# Patient Record
Sex: Male | Born: 1950
Health system: Southern US, Community
[De-identification: ages and names within clinical notes are randomized; demographics above are authoritative.]

## PROBLEM LIST (undated history)

## (undated) DIAGNOSIS — M1990 Unspecified osteoarthritis, unspecified site: Secondary | ICD-10-CM

## (undated) DIAGNOSIS — K219 Gastro-esophageal reflux disease without esophagitis: Secondary | ICD-10-CM

## (undated) DIAGNOSIS — H409 Unspecified glaucoma: Secondary | ICD-10-CM

## (undated) DIAGNOSIS — Z8719 Personal history of other diseases of the digestive system: Secondary | ICD-10-CM

## (undated) DIAGNOSIS — Z973 Presence of spectacles and contact lenses: Secondary | ICD-10-CM

## (undated) DIAGNOSIS — K222 Esophageal obstruction: Secondary | ICD-10-CM

## (undated) DIAGNOSIS — K2270 Barrett's esophagus without dysplasia: Secondary | ICD-10-CM

## (undated) DIAGNOSIS — H269 Unspecified cataract: Secondary | ICD-10-CM

## (undated) DIAGNOSIS — N486 Induration penis plastica: Secondary | ICD-10-CM

## (undated) DIAGNOSIS — N320 Bladder-neck obstruction: Secondary | ICD-10-CM

## (undated) DIAGNOSIS — K635 Polyp of colon: Secondary | ICD-10-CM

## (undated) DIAGNOSIS — Z970 Presence of artificial eye: Secondary | ICD-10-CM

## (undated) DIAGNOSIS — R7303 Prediabetes: Secondary | ICD-10-CM

## (undated) DIAGNOSIS — E119 Type 2 diabetes mellitus without complications: Secondary | ICD-10-CM

## (undated) DIAGNOSIS — E785 Hyperlipidemia, unspecified: Secondary | ICD-10-CM

## (undated) DIAGNOSIS — F17200 Nicotine dependence, unspecified, uncomplicated: Secondary | ICD-10-CM

## (undated) DIAGNOSIS — G4730 Sleep apnea, unspecified: Secondary | ICD-10-CM

## (undated) DIAGNOSIS — I10 Essential (primary) hypertension: Secondary | ICD-10-CM

## (undated) HISTORY — DX: Sleep apnea, unspecified: G47.30

## (undated) HISTORY — PX: OTHER SURGICAL HISTORY: SHX169

## (undated) HISTORY — DX: Prediabetes: R73.03

## (undated) HISTORY — DX: Gastro-esophageal reflux disease without esophagitis: K21.9

## (undated) HISTORY — DX: Type 2 diabetes mellitus without complications: E11.9

## (undated) HISTORY — PX: KNEE ARTHROSCOPY: SUR90

## (undated) HISTORY — PX: POLYPECTOMY: SHX149

## (undated) HISTORY — PX: BACK SURGERY: SHX140

## (undated) HISTORY — PX: EYE SURGERY: SHX253

## (undated) HISTORY — DX: Unspecified cataract: H26.9

## (undated) HISTORY — PX: COLONOSCOPY: SHX174

## (undated) HISTORY — DX: Polyp of colon: K63.5

## (undated) HISTORY — DX: Essential (primary) hypertension: I10

## (undated) HISTORY — PX: TONSILLECTOMY: SUR1361

## (undated) HISTORY — PX: TOTAL KNEE ARTHROPLASTY: SHX125

## (undated) HISTORY — PX: JOINT REPLACEMENT: SHX530

## (undated) HISTORY — PX: NASAL SINUS SURGERY: SHX719

## (undated) HISTORY — DX: Hyperlipidemia, unspecified: E78.5

## (undated) HISTORY — DX: Unspecified glaucoma: H40.9

## (undated) HISTORY — DX: Nicotine dependence, unspecified, uncomplicated: F17.200

## (undated) HISTORY — DX: Bladder-neck obstruction: N32.0

## (undated) HISTORY — PX: ELBOW SURGERY: SHX618

## (undated) HISTORY — DX: Presence of artificial eye: Z97.0

---

## 1998-08-27 ENCOUNTER — Other Ambulatory Visit: Admission: RE | Admit: 1998-08-27 | Discharge: 1998-08-27 | Payer: Self-pay | Admitting: Gastroenterology

## 2000-06-06 ENCOUNTER — Encounter: Payer: Self-pay | Admitting: *Deleted

## 2000-06-06 ENCOUNTER — Emergency Department (HOSPITAL_COMMUNITY): Admission: EM | Admit: 2000-06-06 | Discharge: 2000-06-06 | Payer: Self-pay | Admitting: *Deleted

## 2001-05-07 ENCOUNTER — Other Ambulatory Visit: Admission: RE | Admit: 2001-05-07 | Discharge: 2001-05-07 | Payer: Self-pay | Admitting: Urology

## 2001-05-07 ENCOUNTER — Encounter (INDEPENDENT_AMBULATORY_CARE_PROVIDER_SITE_OTHER): Payer: Self-pay | Admitting: Specialist

## 2006-07-06 ENCOUNTER — Ambulatory Visit: Payer: Self-pay | Admitting: Internal Medicine

## 2006-07-06 DIAGNOSIS — E781 Pure hyperglyceridemia: Secondary | ICD-10-CM | POA: Insufficient documentation

## 2006-07-06 DIAGNOSIS — E785 Hyperlipidemia, unspecified: Secondary | ICD-10-CM

## 2006-07-21 ENCOUNTER — Ambulatory Visit: Payer: Self-pay | Admitting: Gastroenterology

## 2006-08-14 ENCOUNTER — Ambulatory Visit: Payer: Self-pay | Admitting: Gastroenterology

## 2006-08-26 ENCOUNTER — Encounter (INDEPENDENT_AMBULATORY_CARE_PROVIDER_SITE_OTHER): Payer: Self-pay | Admitting: *Deleted

## 2006-08-26 ENCOUNTER — Ambulatory Visit: Payer: Self-pay | Admitting: Gastroenterology

## 2006-08-26 ENCOUNTER — Ambulatory Visit: Payer: Self-pay | Admitting: Internal Medicine

## 2006-08-26 LAB — CONVERTED CEMR LAB
ALT: 25 units/L (ref 0–40)
AST: 20 units/L (ref 0–37)
Albumin: 4 g/dL (ref 3.5–5.2)
Alkaline Phosphatase: 48 units/L (ref 39–117)
BUN: 9 mg/dL (ref 6–23)
Basophils Absolute: 0 10*3/uL (ref 0.0–0.1)
Basophils Relative: 0.3 % (ref 0.0–1.0)
CO2: 27 meq/L (ref 19–32)
Calcium: 9.3 mg/dL (ref 8.4–10.5)
Chloride: 104 meq/L (ref 96–112)
Chol/HDL Ratio, serum: 4.5
Cholesterol: 246 mg/dL (ref 0–200)
Creatinine, Ser: 0.9 mg/dL (ref 0.4–1.5)
Eosinophil percent: 3.1 % (ref 0.0–5.0)
GFR calc non Af Amer: 93 mL/min
Glomerular Filtration Rate, Af Am: 113 mL/min/{1.73_m2}
Glucose, Bld: 93 mg/dL (ref 70–99)
HCT: 42.4 % (ref 39.0–52.0)
HDL: 55.1 mg/dL (ref >39.0)
Hemoglobin: 14.2 g/dL (ref 13.0–17.0)
LDL DIRECT: 190.6 mg/dL
Lymphocytes Relative: 50.1 % — ABNORMAL HIGH (ref 12.0–46.0)
MCHC: 33.6 g/dL (ref 30.0–36.0)
MCV: 90.8 fL (ref 78.0–100.0)
Monocytes Absolute: 0.4 10*3/uL (ref 0.2–0.7)
Monocytes Relative: 8.7 % (ref 3.0–11.0)
Neutro Abs: 1.6 10*3/uL (ref 1.4–7.7)
Neutrophils Relative %: 37.8 % — ABNORMAL LOW (ref 43.0–77.0)
PSA: 1.39 ng/mL (ref 0.10–4.00)
Platelets: 288 10*3/uL (ref 150–400)
Potassium: 4 meq/L (ref 3.5–5.1)
RBC: 4.67 M/uL (ref 4.22–5.81)
RDW: 12 % (ref 11.5–14.6)
Sodium: 138 meq/L (ref 135–145)
TSH: 2.1 microintl units/mL (ref 0.35–5.50)
Total Bilirubin: 1.2 mg/dL (ref 0.3–1.2)
Total Protein: 7.5 g/dL (ref 6.0–8.3)
Triglyceride fasting, serum: 64 mg/dL (ref 0–149)
VLDL: 13 mg/dL (ref 0–40)
WBC: 4.2 10*3/uL — ABNORMAL LOW (ref 4.5–10.5)

## 2006-09-02 ENCOUNTER — Ambulatory Visit: Payer: Self-pay | Admitting: Internal Medicine

## 2006-09-29 ENCOUNTER — Ambulatory Visit: Payer: Self-pay | Admitting: Gastroenterology

## 2006-10-13 ENCOUNTER — Ambulatory Visit: Payer: Self-pay | Admitting: Gastroenterology

## 2006-10-13 ENCOUNTER — Ambulatory Visit: Payer: Self-pay | Admitting: Internal Medicine

## 2006-10-13 LAB — CONVERTED CEMR LAB
ALT: 38 units/L (ref 0–40)
AST: 28 units/L (ref 0–37)
Albumin: 4.1 g/dL (ref 3.5–5.2)
Alkaline Phosphatase: 48 units/L (ref 39–117)
Bilirubin, Direct: 0.1 mg/dL (ref 0.0–0.3)
Cholesterol: 266 mg/dL (ref 0–200)
Direct LDL: 160.1 mg/dL
HDL: 58.8 mg/dL (ref >39.0)
Total Bilirubin: 1.1 mg/dL (ref 0.3–1.2)
Total CHOL/HDL Ratio: 4.5
Total Protein: 7.5 g/dL (ref 6.0–8.3)
Triglycerides: 188 mg/dL — ABNORMAL HIGH (ref 0–149)
VLDL: 38 mg/dL (ref 0–40)

## 2006-10-21 ENCOUNTER — Ambulatory Visit: Payer: Self-pay | Admitting: Internal Medicine

## 2006-12-02 ENCOUNTER — Ambulatory Visit: Payer: Self-pay | Admitting: Internal Medicine

## 2006-12-02 LAB — CONVERTED CEMR LAB
ALT: 41 units/L — ABNORMAL HIGH (ref 0–40)
AST: 24 units/L (ref 0–37)
Albumin: 3.4 g/dL — ABNORMAL LOW (ref 3.5–5.2)
Alkaline Phosphatase: 49 units/L (ref 39–117)
Bilirubin, Direct: 0.2 mg/dL (ref 0.0–0.3)
Cholesterol: 203 mg/dL (ref 0–200)
Direct LDL: 108.9 mg/dL
HDL: 51.2 mg/dL (ref >39.0)
Total Bilirubin: 1 mg/dL (ref 0.3–1.2)
Total CHOL/HDL Ratio: 4
Total Protein: 7.4 g/dL (ref 6.0–8.3)
Triglycerides: 244 mg/dL (ref 0–149)
VLDL: 49 mg/dL — ABNORMAL HIGH (ref 0–40)

## 2006-12-09 ENCOUNTER — Ambulatory Visit: Payer: Self-pay | Admitting: Internal Medicine

## 2008-01-11 ENCOUNTER — Ambulatory Visit: Payer: Self-pay | Admitting: Internal Medicine

## 2008-01-11 DIAGNOSIS — I10 Essential (primary) hypertension: Secondary | ICD-10-CM | POA: Insufficient documentation

## 2008-03-01 ENCOUNTER — Encounter: Payer: Self-pay | Admitting: Internal Medicine

## 2008-03-13 ENCOUNTER — Ambulatory Visit: Payer: Self-pay | Admitting: Internal Medicine

## 2008-04-10 ENCOUNTER — Telehealth: Payer: Self-pay | Admitting: Internal Medicine

## 2008-04-17 ENCOUNTER — Inpatient Hospital Stay (HOSPITAL_COMMUNITY): Admission: RE | Admit: 2008-04-17 | Discharge: 2008-04-19 | Payer: Self-pay | Admitting: Orthopedic Surgery

## 2008-12-29 ENCOUNTER — Ambulatory Visit: Payer: Self-pay | Admitting: Internal Medicine

## 2009-01-04 ENCOUNTER — Encounter: Payer: Self-pay | Admitting: Internal Medicine

## 2009-01-09 LAB — CONVERTED CEMR LAB
Albumin: 3.9 g/dL (ref 3.5–5.2)
Alkaline Phosphatase: 40 units/L (ref 39–117)
BUN: 15 mg/dL (ref 6–23)
CO2: 29 meq/L (ref 19–32)
Chloride: 105 meq/L (ref 96–112)
Cholesterol: 277 mg/dL — ABNORMAL HIGH (ref 0–200)
Direct LDL: 122.9 mg/dL
Potassium: 4.1 meq/L (ref 3.5–5.1)
Total Bilirubin: 1.2 mg/dL (ref 0.3–1.2)
Total CHOL/HDL Ratio: 7
Triglycerides: 717 mg/dL — ABNORMAL HIGH (ref 0.0–149.0)
VLDL: 143.4 mg/dL — ABNORMAL HIGH (ref 0.0–40.0)

## 2009-09-22 HISTORY — PX: OTHER SURGICAL HISTORY: SHX169

## 2010-01-17 ENCOUNTER — Ambulatory Visit: Payer: Self-pay | Admitting: Internal Medicine

## 2010-01-17 DIAGNOSIS — G4730 Sleep apnea, unspecified: Secondary | ICD-10-CM | POA: Insufficient documentation

## 2010-01-17 DIAGNOSIS — K219 Gastro-esophageal reflux disease without esophagitis: Secondary | ICD-10-CM

## 2010-01-22 LAB — CONVERTED CEMR LAB
BUN: 15 mg/dL (ref 6–23)
Calcium: 9.1 mg/dL (ref 8.4–10.5)
GFR calc non Af Amer: 127.66 mL/min (ref >60)
Glucose, Bld: 101 mg/dL — ABNORMAL HIGH (ref 70–99)
HDL: 48.3 mg/dL (ref >39.00)
Triglycerides: 787 mg/dL — ABNORMAL HIGH (ref 0.0–149.0)

## 2010-01-31 ENCOUNTER — Telehealth: Payer: Self-pay | Admitting: Internal Medicine

## 2010-02-19 ENCOUNTER — Ambulatory Visit (HOSPITAL_BASED_OUTPATIENT_CLINIC_OR_DEPARTMENT_OTHER): Admission: RE | Admit: 2010-02-19 | Discharge: 2010-02-19 | Payer: Self-pay | Admitting: Internal Medicine

## 2010-02-19 ENCOUNTER — Encounter: Payer: Self-pay | Admitting: Internal Medicine

## 2010-02-26 ENCOUNTER — Ambulatory Visit: Payer: Self-pay | Admitting: Pulmonary Disease

## 2010-03-08 ENCOUNTER — Ambulatory Visit: Payer: Self-pay | Admitting: Internal Medicine

## 2010-03-11 LAB — CONVERTED CEMR LAB
AST: 21 units/L (ref 0–37)
Alkaline Phosphatase: 44 units/L (ref 39–117)
Bilirubin, Direct: 0.1 mg/dL (ref 0.0–0.3)
Total Bilirubin: 0.8 mg/dL (ref 0.3–1.2)
Total CHOL/HDL Ratio: 5

## 2010-05-01 ENCOUNTER — Ambulatory Visit: Payer: Self-pay | Admitting: Internal Medicine

## 2010-05-01 LAB — CONVERTED CEMR LAB
Bilirubin, Direct: 0.1 mg/dL (ref 0.0–0.3)
HDL: 41.2 mg/dL (ref >39.00)
Total Bilirubin: 0.2 mg/dL — ABNORMAL LOW (ref 0.3–1.2)
Total Protein: 6.8 g/dL (ref 6.0–8.3)
VLDL: 342.2 mg/dL — ABNORMAL HIGH (ref 0.0–40.0)

## 2010-06-07 ENCOUNTER — Ambulatory Visit: Payer: Self-pay | Admitting: Internal Medicine

## 2010-06-19 ENCOUNTER — Observation Stay (HOSPITAL_COMMUNITY)
Admission: EM | Admit: 2010-06-19 | Discharge: 2010-06-20 | Payer: Self-pay | Source: Home / Self Care | Admitting: Emergency Medicine

## 2010-06-26 ENCOUNTER — Encounter: Payer: Self-pay | Admitting: Internal Medicine

## 2010-07-11 ENCOUNTER — Ambulatory Visit: Payer: Self-pay | Admitting: Internal Medicine

## 2010-07-11 LAB — CONVERTED CEMR LAB
AST: 16 units/L (ref 0–37)
Albumin: 4.5 g/dL (ref 3.5–5.2)
Cholesterol: 219 mg/dL — ABNORMAL HIGH (ref 0–200)
Direct LDL: 92.4 mg/dL
Total CHOL/HDL Ratio: 4
Triglycerides: 528 mg/dL — ABNORMAL HIGH (ref 0.0–149.0)
VLDL: 105.6 mg/dL — ABNORMAL HIGH (ref 0.0–40.0)

## 2010-07-18 ENCOUNTER — Encounter: Payer: Self-pay | Admitting: Internal Medicine

## 2010-07-18 ENCOUNTER — Ambulatory Visit: Payer: Self-pay | Admitting: Internal Medicine

## 2010-08-29 ENCOUNTER — Encounter
Admission: RE | Admit: 2010-08-29 | Discharge: 2010-08-29 | Payer: Self-pay | Source: Home / Self Care | Attending: Otolaryngology | Admitting: Otolaryngology

## 2010-10-17 ENCOUNTER — Telehealth: Payer: Self-pay | Admitting: Internal Medicine

## 2010-10-22 NOTE — Letter (Signed)
Summary: [facility]  [facility]   Imported By: Maryln Gottron 07/08/2010 09:28:38  _____________________________________________________________________  External Attachment:    Type:   Image     Comment:   External Document

## 2010-10-22 NOTE — Assessment & Plan Note (Signed)
Summary: ROA/BP CK/RCD   Vital Signs:  Patient profile:   60 year old male Weight:      226 pounds Pulse rate:   76 / minute Pulse rhythm:   regular Resp:     12 per minute BP sitting:   144 / 102  (left arm) Cuff size:   regular  Vitals Entered By: Gladis Riffle, RN (January 17, 2010 9:10 AM) CC: BP check--med review and needs refills Is Patient Diabetic? No   CC:  BP check--med review and needs refills.  History of Present Illness:  Follow-Up Visit      This is a 60 year old man who presents for Follow-up visit.  The patient denies chest pain and palpitations.  Since the last visit the patient notes no new problems or concerns except admits to hypersomnolence.  The patient reports taking meds as prescribed.  When questioned about possible medication side effects, the patient notes none.    All other systems reviewed and were negative   Preventive Screening-Counseling & Management  Alcohol-Tobacco     Smoking Status: quit     Year Quit: 2000  Current Problems (verified): 1)  Hypertension  (ICD-401.9) 2)  Hyperlipidemia  (ICD-272.4)  Current Medications (verified): 1)  Prevacid 30 Mg Cpdr (Lansoprazole) .... Take 1 Capsule By Mouth Once A Day As Needed 2)  Bl Aspirin 325 Mg  Tabs (Aspirin) .... As Needed Pain 3)  Lisinopril 20 Mg Tabs (Lisinopril) .Marland Kitchen.. 1 Tablet By Mouth Daily 4)  Simvastatin 40 Mg Tabs (Simvastatin) .... Take 1 Tablet By Mouth Once A Day  Allergies (verified): No Known Drug Allergies  Past History:  Past Medical History: Hyperlipidemia Bladder Neck Obstruction Hypertension GERD  Family History: Reviewed history from 07/06/2006 and no changes required. Family History of Colon CA 1st degree relative <60 Family History Lung cancer  Social History: Reviewed history from 05/31/2007 and no changes required. 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   Eyes:  pupils equal and pupils round.   Ears:  R ear normal and L ear normal.   Neck:  No deformities, masses, or tenderness noted. Chest Wall:  No deformities, masses, tenderness or gynecomastia noted. Lungs:  Normal respiratory effort, chest expands symmetrically. Lungs are clear to auscultation, no crackles or wheezes. Heart:  Normal rate and regular rhythm. S1 and S2 normal without gallop, murmur, click, rub or other extra sounds. Abdomen:   normal bowel sounds; no hepatosplenomegaly no ventral,umbilical hernias or masses noted.   Skin:  turgor normal and color normal.   Psych:  normally interactive and good eye contact.     Impression & Recommendations:  Problem # 1:  HYPERTENSION (ICD-401.9) Assessment Deteriorated  not as well controlled increalse meds side effects discussed His updated medication list for this problem includes:    Lisinopril-hydrochlorothiazide 20-25 Mg Tabs (Lisinopril-hydrochlorothiazide) .Marland Kitchen... Take 1 tab by mouth daily  BP today: 144/102 Prior BP: 136/98 (12/29/2008)  Labs Reviewed: K+: 4.1 (12/29/2008) Creat: : 0.9 (12/29/2008)   Chol: 277 (12/29/2008)   HDL: 40.60 (12/29/2008)   LDL: DEL (12/02/2006)   TG: 717.0 (12/29/2008)  Orders: Venipuncture ([address]) TLB-Lipid Panel (80061-LIPID) TLB-BMP (Basic Metabolic Panel-BMET) (80048-METABOL) TLB-TSH (Thyroid Stimulating Hormone) (84443-TSH)  Problem # 2:  GERD (ICD-530.81) Assessment: Unchanged  His updated medication list for this problem includes:    Prevacid 30 Mg Cpdr (Lansoprazole) .Marland KitchenMarland KitchenMarland KitchenMarland Kitchen  Take 1 capsule by mouth once a day as needed  Labs Reviewed: Hgb: 14.2 (08/26/2006)   Hct: 42.4 (08/26/2006)  Problem # 3:  HYPERLIPIDEMIA (ICD-272.4)  he admits to forgetting the meds His updated medication list for this problem includes:    Simvastatin 40 Mg  Tabs (Simvastatin) .Marland Kitchen... Take 1 tablet by mouth once a day  Labs Reviewed: SGOT: 19 (12/29/2008)   SGPT: 30 (12/29/2008)   HDL:40.60 (12/29/2008), 51.2 (12/02/2006)  LDL:DEL (12/02/2006), DEL (10/13/2006)  Chol:277 (12/29/2008), 203 (12/02/2006)  Trig:717.0 (12/29/2008), 244 (12/02/2006)  Orders: Venipuncture (16[address]) TLB-Lipid Panel (80061-LIPID) TLB-BMP (Basic Metabolic Panel-BMET) (80048-METABOL) TLB-TSH (Thyroid Stimulating Hormone) (84443-TSH)  Problem # 4:  SLEEP APNEA (ICD-780.57) Assessment: New need to evaluate  Orders: Sleep Disorder Referral (Sleep Disorder)  Complete Medication List: 1)  Prevacid 30 Mg Cpdr (Lansoprazole) .... Take 1 capsule by mouth once a day as needed 2)  Bl Aspirin 325 Mg Tabs (Aspirin) .... As needed pain 3)  Lisinopril-hydrochlorothiazide 20-25 Mg Tabs (Lisinopril-hydrochlorothiazide) .... Take 1 tab by mouth daily 4)  Simvastatin 40 Mg Tabs (Simvastatin) .... Take 1 tablet by mouth once a day  Patient Instructions: 1)  Please schedule a follow-up appointment in 1 month. Prescriptions: LISINOPRIL-HYDROCHLOROTHIAZIDE 20-25 MG  TABS (LISINOPRIL-HYDROCHLOROTHIAZIDE) Take 1 tab by mouth daily  #90 x 3   Entered and Authorized by:   Birdie Sons MD   Signed by:   Birdie Sons MD on 01/17/2010   Method used:   [facility]        [facility]  [address] [phone]* (retail)       [address]       [place], [place]  [address]       Ph: [phone]       Fax: [phone]   RxID:   [id number]

## 2010-10-22 NOTE — Progress Notes (Signed)
Summary: INFO ONLY - CONCERNING APPTS  Phone Note Call from Patient   Summary of Call: INFO ONLY------- Attempted to make contact with pt at home # listed [phone] and received a msg adv that the # had been changed to [phone].... However, when that # was called, a lady answered the phone and adv that there was no one by the name of the pt there and I had the wrong #. I contacted the pts daughter who provided the cell # for the pt [phone].... I called the pt at that # and spoke with him, he adv that he was at work and he would have to c/b once he figured out his vacation time / days available... I adv pt to be sure that he called back so that an appt for labs (lipid 272.4 / liver 995.2) and f/u appt with Dr Cato Mulligan can be scheduled.... Pt acknowledged same and adv he woud c/b for appt..   Initial call taken by: Debbra Riding,  Jan 31, 2010 12:17 PM

## 2010-10-22 NOTE — Procedures (Signed)
Summary: Colonoscopy/[facility]  [facility]   Imported By: Lanelle Bal 03/08/2010 09:29:25  _____________________________________________________________________  External Attachment:    Type:   Image     Comment:   External Document

## 2010-10-22 NOTE — Assessment & Plan Note (Signed)
Vital Signs:  Patient profile:   60 year old male Weight:      210 pounds Temp:     98.6 degrees F oral Pulse rhythm:   regular Resp:     16 per minute BP sitting:   110 / 82  Vitals Entered By: Lynann Beaver CMA AAMA (July 18, 2010 8:15 AM) CC: rov Is Patient Diabetic? No Pain Assessment Patient in pain? no        CC:  rov.  History of Present Illness: recent traumatic injury---required enucleation right eye (work accident)  htn---tolerating meds lipids: tolerating meds GERD---tolerating meds without difficulty  All other systems reviewed and were negative -- has f/u with ophthalmologist  Current Problems (verified): 1)  Preventive Health Care  (ICD-V70.0) 2)  Sleep Apnea  (ICD-780.57) 3)  Gerd  (ICD-530.81) 4)  Hypertension  (ICD-401.9) 5)  Hyperlipidemia  (ICD-272.4)  Current Medications (verified): 1)  Prevacid 30 Mg Cpdr (Lansoprazole) .... Take 1 Capsule By Mouth Once A Day As Needed 2)  Bl Aspirin 325 Mg  Tabs (Aspirin) .... As Needed Pain 3)  Lisinopril-Hydrochlorothiazide 20-25 Mg  Tabs (Lisinopril-Hydrochlorothiazide) .... Take 1 Tab By Mouth Daily 4)  Crestor 20 Mg Tabs (Rosuvastatin Calcium) .Marland Kitchen.. 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   Head:  normocephalic and atraumatic.   Eyes:  absent right eye Neck:  No deformities, masses, or tenderness noted. Lungs:  normal respiratory effort and no intercostal retractions.   Heart:  normal rate and regular rhythm.   Abdomen:   normal bowel sounds; no hepatosplenomegaly no ventral,umbilical hernias or masses noted.   Skin:  turgor normal and color normal.   Psych:  normally interactive and good eye contact.     Impression & Recommendations:  Problem # 1:  HYPERTENSION (ICD-401.9) tolerating meds His updated medication list for this problem includes:    Lisinopril-hydrochlorothiazide 20-25 Mg Tabs (Lisinopril-hydrochlorothiazide) .Marland Kitchen... Take 1 tab by mouth daily  BP today: 110/82 Prior BP: 120/84 (06/07/2010)  Labs Reviewed: K+: 4.2 (01/17/2010) Creat: : 0.8 (01/17/2010)   Chol: 219 (07/11/2010)   HDL: 49.00 (07/11/2010)   LDL: DEL (12/02/2006)   TG: 528.0 (07/11/2010)  Problem # 2:  HYPERLIPIDEMIA (ICD-272.4) better continue current medications and f/u in 4 months His updated medication list for this problem includes:    Crestor 20 Mg Tabs (Rosuvastatin calcium) .Marland Kitchen... 1 tablet by mouth daily  Labs Reviewed: SGOT: 16 (07/11/2010)   SGPT: 31 (07/11/2010)   HDL:49.00 (07/11/2010), 41.20 (05/01/2010)  LDL:DEL (12/02/2006), DEL (10/13/2006)  Chol:219 (07/11/2010), 252 (05/01/2010)  Trig:528.0 (07/11/2010), 1711.0 Triglyceride is over 400; calculations on Lipids are invalid. mg/dL (16/06/9603)  Problem # 3:  GERD (ICD-530.81) controlled continue current medications  His updated medication list for this problem includes:    Prevacid 30 Mg Cpdr (Lansoprazole) .Marland Kitchen... Take 1 capsule by mouth once a day as needed  Labs Reviewed: Hgb: 14.2 (08/26/2006)  Hct: 42.4 (08/26/2006)  Complete Medication List: 1)  Prevacid 30 Mg Cpdr (Lansoprazole) .... Take 1 capsule by mouth once a day as needed 2)  Bl Aspirin 325 Mg Tabs (Aspirin) .... As needed pain 3)   Lisinopril-hydrochlorothiazide 20-25 Mg Tabs (Lisinopril-hydrochlorothiazide) .... Take 1 tab by mouth daily 4)  Crestor 20 Mg Tabs (Rosuvastatin calcium) .Marland Kitchen.. 1 tablet by mouth daily  Patient Instructions: 1)  Please schedule a follow-up appointment in 4 months. 2)  lipids 272.4 3)  liver 995.2 4)  bmet--995.2   Orders Added: 1)  Est. Patient Level IV [54627]

## 2010-10-22 NOTE — Assessment & Plan Note (Signed)
Summary: cpx/pt will come in fasting/njr   Vital Signs:  Patient profile:   60 year old male Height:      70 inches Weight:      218 pounds BMI:     31.39 Temp:     98.5 degrees F oral BP sitting:   120 / 84  (left arm) Cuff size:   regular  Vitals Entered By: Kern Reap CMA Duncan Dull) (June 07, 2010 8:14 AM)  Contraindications/Deferment of Procedures/Staging:    Test/Procedure: FLU VAX    Reason for deferment: patient declined  CC: cpx Is Patient Diabetic? No Pain Assessment Patient in pain? yes        CC:  cpx.  History of Present Illness: CPX  new complaint---left arm pain, typically notices when he is driving-  Preventive Screening-Counseling & Management  Alcohol-Tobacco     Smoking Status: current     Packs/Day: <0.25  Current Problems (verified): 1)  Sleep Apnea  (ICD-780.57) 2)  Gerd  (ICD-530.81) 3)  Hypertension  (ICD-401.9) 4)  Hyperlipidemia  (ICD-272.4)  Current Medications (verified): 1)  Prevacid 30 Mg Cpdr (Lansoprazole) .... Take 1 Capsule By Mouth Once A Day As Needed 2)  Bl Aspirin 325 Mg  Tabs (Aspirin) .... As Needed Pain 3)  Lisinopril-Hydrochlorothiazide 20-25 Mg  Tabs (Lisinopril-Hydrochlorothiazide) .... Take 1 Tab By Mouth Daily 4)  Simvastatin 40 Mg Tabs (Simvastatin) .... 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 Take 1 capsule by mouth once a day as needed  Problem # 4:  HYPERTENSION (ICD-401.9)  His updated medication list for this problem includes:    Lisinopril-hydrochlorothiazide 20-25 Mg Tabs (Lisinopril-hydrochlorothiazide) .Marland Kitchen... Take 1 tab by mouth daily  Problem # 5:  HYPERLIPIDEMIA (ICD-272.4)  His updated medication list for this problem includes:    Crestor 20 Mg Tabs (Rosuvastatin calcium) .Marland Kitchen... 1 tablet by mouth daily  Labs Reviewed: SGOT: 28 (05/01/2010)   SGPT: 34 (05/01/2010)   HDL:41.20 (05/01/2010), 55.00 (03/08/2010)  LDL:DEL (12/02/2006), DEL (10/13/2006)  Chol:252 (05/01/2010), 266 (03/08/2010)  Trig:1711.0 Triglyceride is over 400; calculations on Lipids are invalid. mg/dL (16/06/9603), 540.9 (81/19/1478)  Complete Medication List: 1)  Prevacid 30 Mg Cpdr (Lansoprazole) .... Take 1 capsule by mouth once a day as needed 2)  Bl Aspirin 325 Mg Tabs (Aspirin) .... As needed pain 3)  Lisinopril-hydrochlorothiazide 20-25 Mg Tabs (Lisinopril-hydrochlorothiazide) .... Take 1 tab by mouth daily 4)  Crestor 20 Mg Tabs (Rosuvastatin calcium) .Marland Kitchen.. 1 tablet by mouth daily Prescriptions: CRESTOR 20 MG TABS (ROSUVASTATIN CALCIUM) 1 tablet by mouth daily  #90 x 3   Entered and Authorized by:   Birdie Sons  MD   Signed by:   Birdie Sons MD on 06/07/2010   Method used:   Samples Given   RxID:   [id number]  Physical Exam General Appearance: well developed, well nourished, no acute distress Eyes: conjunctiva and lids normal, PERRL, EOMI,  Ears, Nose, Mouth, Throat: TM clear, nares  clear, oral exam WNL Neck: supple, no lymphadenopathy, no thyromegaly, no JVD Respiratory: clear to auscultation and percussion, respiratory effort normal Cardiovascular: regular rate and rhythm, S1-S2, no murmur, rub or gallop, no bruits, peripheral pulses normal and symmetric, no cyanosis, clubbing, edema or varicosities Chest: no scars, masses, tenderness; no asymmetry, skin changes, nipple discharge, no gynecomastia   Gastrointestinal: soft, non-tender; no hepatosplenomegaly, masses; active bowel sounds all quadrants,  no masses, tenderness, hemorrhoids  Genitourinary: no hernia, or prostate enlargement Lymphatic: no cervical, axillary or inguinal adenopathy Musculoskeletal: gait normal, muscle tone and strength WNL, no joint swelling, effusions, discoloration, crepitus  Skin: clear, good turgor, color WNL, no rashes, lesions, or ulcerations Neurologic: normal mental status, normal reflexes, normal strength, sensation, and motion Psychiatric: alert; oriented to person, place and time Other Exam:      Immunization History:  Tetanus/Td Immunization History:    Tetanus/Td:  historical (09/23/2007)

## 2010-10-22 NOTE — Assessment & Plan Note (Signed)
Summary: follow up/pt coming in fasting/cjr   Vital Signs:  Patient profile:   60 year old male Height:      72 inches Weight:      221 pounds BMI:     30.08 Temp:     98.4 degrees F oral Pulse rate:   80 / minute Pulse rhythm:   regular Resp:     12 per minute BP sitting:   110 / 86  (left arm) Cuff size:   regular  Vitals Entered By: Gladis Riffle, RN (March 08, 2010 8:15 AM) CC: 1 month rov, fasting Is Patient Diabetic? No   CC:  1 month rov and fasting.  History of Present Illness:  Follow-Up Visit      This is a 60 year old man who presents for Follow-up visit.  The patient denies chest pain and palpitations.  Since the last visit the patient notes no new problems or concerns.  The patient reports taking meds as prescribed.  When questioned about possible medication side effects, the patient notes none.    All other systems reviewed and were negative   Preventive Screening-Counseling & Management  Alcohol-Tobacco     Smoking Status: quit     Year Quit: 2000  Current Problems (verified): 1)  Sleep Apnea  (ICD-780.57) 2)  Gerd  (ICD-530.81) 3)  Hypertension  (ICD-401.9) 4)  Hyperlipidemia  (ICD-272.4)  Current Medications (verified): 1)  Prevacid 30 Mg Cpdr (Lansoprazole) .... Take 1 Capsule By Mouth Once A Day As Needed 2)  Bl Aspirin 325 Mg  Tabs (Aspirin) .... As Needed Pain 3)  Lisinopril-Hydrochlorothiazide 20-25 Mg  Tabs (Lisinopril-Hydrochlorothiazide) .... Take 1 Tab By Mouth Daily 4)  Simvastatin 40 Mg Tabs (Simvastatin) .... 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   Eyes:  pupils equal and pupils round.   Ears:  R ear normal and L ear normal.   Neck:  No deformities, masses, or tenderness noted. Chest Wall:  No deformities, masses, tenderness or gynecomastia noted. Lungs:  Normal respiratory effort, chest expands symmetrically. Lungs are clear to auscultation, no crackles or wheezes. Heart:  Normal rate and regular rhythm. S1 and S2 normal without gallop, murmur, click, rub or other extra sounds. Abdomen:   normal bowel sounds; no hepatosplenomegaly no ventral,umbilical hernias or masses noted.   Msk:  No deformity or scoliosis noted of thoracic or lumbar spine.   Neurologic:  cranial nerves II-XII intact and gait normal.   Psych:  normally interactive and good eye contact.     Impression & Recommendations:  Problem # 1:  SLEEP APNEA (ICD-780.57) Assessment New  discussed at length needs RX see orders  Orders: Home Health Referral [facility])  Problem # 2:  HYPERTENSION (ICD-401.9) controlled continue current medications  His updated medication list for this problem includes:    Lisinopril-hydrochlorothiazide 20-25 Mg Tabs (  Lisinopril-hydrochlorothiazide) .Marland Kitchen... Take 1 tab by mouth daily  BP today: 110/86 Prior BP: 144/102 (01/17/2010)  Labs Reviewed: K+: 4.2 (01/17/2010) Creat: : 0.8 (01/17/2010)   Chol: 279 (01/17/2010)   HDL: 48.30 (01/17/2010)   LDL: DEL (12/02/2006)   TG: 787.0 (01/17/2010)  Problem # 3:  HYPERLIPIDEMIA (ICD-272.4)  check labs today has been out of simvastatin for 3-4  days His updated medication list for this problem includes:    Simvastatin 40 Mg Tabs (Simvastatin) .Marland Kitchen... Take 1 tablet by mouth once a day  Labs Reviewed: SGOT: 19 (12/29/2008)   SGPT: 30 (12/29/2008)   HDL:48.30 (01/17/2010), 40.60 (12/29/2008)  LDL:DEL (12/02/2006), DEL (10/13/2006)  Chol:279 (01/17/2010), 277 (12/29/2008)  Trig:787.0 (01/17/2010), 717.0 (12/29/2008)  Orders: Venipuncture (33295) TLB-Lipid Panel (80061-LIPID) TLB-Hepatic/Liver Function Pnl (80076-HEPATIC)  Problem # 4:  GERD (ICD-530.81) controlled continue current medications  His updated medication list for this problem includes:    Prevacid 30 Mg Cpdr (Lansoprazole) .Marland Kitchen... Take 1 capsule by mouth once a day as needed  Complete Medication List: 1)  Prevacid 30 Mg Cpdr (Lansoprazole) .... Take 1 capsule by mouth once a day as needed 2)  Bl Aspirin 325 Mg Tabs (Aspirin) .... As needed pain 3)  Lisinopril-hydrochlorothiazide 20-25 Mg Tabs (Lisinopril-hydrochlorothiazide) .... Take 1 tab by mouth daily 4)  Simvastatin 40 Mg Tabs (Simvastatin) .... Take 1 tablet by mouth once a day  Patient Instructions: 1)  Please schedule a follow-up appointment in 3 months. CPX Prescriptions: SIMVASTATIN 40 MG TABS (SIMVASTATIN) Take 1 tablet by mouth once a day  #90 Tablet x 3   Entered and Authorized by:   Birdie Sons MD   Signed by:   Birdie Sons MD on 03/08/2010   Method used:   [facility]  [address] [phone]* (retail)       [address]       [place], [place]  [address]       Ph: [phone]       Fax: [phone]   RxID:   [id number] SIMVASTATIN 40 MG TABS (SIMVASTATIN) Take 1 tablet by mouth once a day  #90 Tablet x 2   Entered by:   Gladis Riffle, RN   Authorized by:   Birdie Sons MD   Signed by:   Gladis Riffle, RN on 03/08/2010   Method used:   [facility]  [address] [phone][facility] (retail)       [address]       [place], [place]  [address]       Ph: [phone]       Fax: [phone]   RxID:   [id number]

## 2010-10-24 NOTE — Progress Notes (Signed)
Summary: 90 day rx  Phone Note Refill Request   Refills Requested: Medication #1:  CRESTOR 20 MG TABS 1 tablet by mouth daily. pt needs #90 crestor 20 mg [facility] [address]  Initial call taken by: Heron Sabins,  October 17, 2010 9:52 AM  Follow-up for Phone Call        90 day supply given in October with 3 refills.  Called pharmacy and have the refills on file, pt aware Follow-up by: Alfred Levins, CMA,  October 17, 2010 11:58 AM

## 2010-10-25 ENCOUNTER — Ambulatory Visit: Payer: Worker's Compensation | Attending: Ophthalmology | Admitting: Occupational Therapy

## 2010-10-25 DIAGNOSIS — IMO0001 Reserved for inherently not codable concepts without codable children: Secondary | ICD-10-CM | POA: Insufficient documentation

## 2010-10-25 DIAGNOSIS — R41842 Visuospatial deficit: Secondary | ICD-10-CM | POA: Insufficient documentation

## 2010-10-25 DIAGNOSIS — R279 Unspecified lack of coordination: Secondary | ICD-10-CM | POA: Insufficient documentation

## 2010-10-30 ENCOUNTER — Ambulatory Visit: Payer: Worker's Compensation | Admitting: Occupational Therapy

## 2010-11-01 ENCOUNTER — Ambulatory Visit: Payer: Worker's Compensation | Admitting: Occupational Therapy

## 2010-11-06 ENCOUNTER — Ambulatory Visit: Payer: Worker's Compensation | Admitting: Occupational Therapy

## 2010-11-07 ENCOUNTER — Other Ambulatory Visit: Payer: Self-pay

## 2010-11-08 ENCOUNTER — Ambulatory Visit: Payer: Worker's Compensation | Admitting: Occupational Therapy

## 2010-11-13 ENCOUNTER — Encounter: Payer: Self-pay | Admitting: Internal Medicine

## 2010-11-13 ENCOUNTER — Ambulatory Visit: Payer: Worker's Compensation | Admitting: Occupational Therapy

## 2010-11-14 ENCOUNTER — Ambulatory Visit: Payer: Self-pay | Admitting: Internal Medicine

## 2010-11-15 ENCOUNTER — Ambulatory Visit: Payer: Self-pay | Admitting: Internal Medicine

## 2010-11-15 ENCOUNTER — Encounter: Payer: Self-pay | Admitting: Occupational Therapy

## 2010-12-05 LAB — POCT I-STAT, CHEM 8
Glucose, Bld: 135 mg/dL — ABNORMAL HIGH (ref 70–99)
HCT: 42 % (ref 39.0–52.0)
Hemoglobin: 14.3 g/dL (ref 13.0–17.0)
Potassium: 3.9 mEq/L (ref 3.5–5.1)
Sodium: 137 mEq/L (ref 135–145)

## 2011-01-15 ENCOUNTER — Other Ambulatory Visit: Payer: Self-pay | Admitting: Cardiovascular Disease

## 2011-02-04 NOTE — Op Note (Signed)
[name], [name]             ACCOUNT NO.:  [id number]   MEDICAL RECORD NO.:  [id number]          PATIENT TYPE:  INP   LOCATION:  0005                         FACILITY:  [facility]   PHYSICIAN:  Madlyn Frankel. Charlann Boxer, M.D.  DATE OF BIRTH:  Jan 30, 1951   DATE OF PROCEDURE:  04/17/2008  DATE OF DISCHARGE:                               OPERATIVE REPORT   PREOPERATIVE DIAGNOSIS:  Right knee end-stage osteoarthritis with  lateral shifting and instability to the knee.  He had anterior cruciate  ligament laxity from his arthritis.   POSTOPERATIVE DIAGNOSIS:  Right knee end-stage osteoarthritis with  lateral shifting and instability to the knee.  He had anterior cruciate  ligament laxity from his arthritis.   PROCEDURE:  Right total knee replacement utilizing primary DePuy  component size 5 femur, 5 tibia, 15 mm insert and a 41 patellar button.   SURGEON:  Madlyn Frankel. Charlann Boxer, M.D.   ASSISTANT:  Surgical tech.   ANESTHESIA:  Duramorph spinal.   TOURNIQUET TIME:  55 minutes 250 mmHg.   DRAIN:  One Hemovac.   COMPLICATIONS:  None.   INDICATIONS FOR PROCEDURE:  [name] is a very pleasant 60 year old  gentleman who presented for evaluation of predominantly right knee pain.  Radiographs showed end-stage bilateral knee osteoarthritis.  His right  knee had significant change and lateral shifting.  He had failed  conservative measures and wished to proceed with knee replacement  surgery.  We discussed the risks and benefits of infection, DVT,  component failure, need for revision surgery, postoperative stiffness  and expectations and therapy that is required.  Consent was obtained.   PROCEDURE IN DETAIL:  The patient was brought to operative theater.  Once adequate anesthesia and preoperative antibiotics, Ancef,  administered the patient was positioned supine with a right thigh  tourniquet placed.  The right lower extremity was prescrubbed and  prepped and draped in sterile fashion.  Midline  incision was made  followed by median arthrotomy creating a large medial peel due to the  extensive medial wear and varus deformity of his lower extremity.  This  revealed significant osteophytes on the proximal and medial tibia as  well as the femur.  Attention was first directed to the patella  following debridement and precut measurement was 26 mm.  I resected down  to 14 mm, used a 41 patellar button to restore the height.   The patella was then subluxing laterally and attention now directed to  the femur.  Femoral canal was opened using a drill, irrigated to prevent  fat emboli.  The intramedullary rod was then passed based on a 5 degree  flexion contracture preoperatively, I chose to resect 11 mm of bone off  the distal femur.  I then sized the femur to be a size 5 despite the  fact there was a significant amount of osteophytes.  It was noted to be  a size 5 in anterior-posterior dimension.   Following this distal femoral cut, attention was directed to the tibia.  The tibia was subluxated anteriorly and using extramedullary guide, I  resected 10 mm of bone off the  lateral proximal tibia.  I then checked  this cut to make sure it was perpendicular and actually had to resect a  little bit more bone off the lateral proximal tibia to assure that it  was a perpendicular cut in the coronal position.   Following this, I checked to make sure that at least the 10 mm spacer  would pass and it did.  At this point, attention was redirected back to  the femur.  Based on the perpendicular cut on the proximal tibia, I set  my rotation utilizing the 5 degree intramedullary rod and the sizing  block.  Once I had set my rotation, I pinned into position.   I then transferred the 4-in-1 cutting block over the top of this and  made sure that I was not up against the notch.  The anterior, posterior  and chamfer cuts were then all made without difficulty and allowed me to  remove osteophytes over the  medial and lateral gutters, thus freeing up  the collateral ligament.   I made the final box cut based off the lateral aspect of the distal  femur.   Tibia was now subluxated anterior.  Further debridements carried out as  necessary.  I used lamina spreaders to elevate the femur so I could  remove osteophytes on the posterior aspect of the knee.  Further  debridements of the posterior, medial and lateral meniscus and capsular  junction was debrided.   I  used a size 4 femur lateralizing it and removing medial osteophytes,  having an excellent fit  with rotation.  It was pinned into position,  drilled and keel punch.  Trial reduction was now carried out with a 5  femur, 4 tibia and initially a 12.5.  I had the option of a potential  12.5 versus 15 mm polyethylene at the end of the case.  The patella  tracked without application of any pressure.  The knee ligaments were  stable from posterior flexion.   All trial components were removed.  Final components were opened.  The  synovial capsule junction was injected with 60 mL of 25% Marcaine with  epinephrine and 1 mL of Toradol.  The knee was irrigated with normal  saline solution, debrided of any loose fragments.  Once the cement was  mixed and prepared, the components were cemented into position.  I  brought the knee out to extension with 15 mm insert, removing the  extruded cement.  Once cement had cured, excess cement was removed  throughout the knee.  Based on this trial reduction at this point, I  used 15 mm x 5 insert to match the size 5 femur.  The final insert was  placed.  The knee irrigated with normal saline solution.  Tourniquet was  let down at 55 minutes at 250 mmHg.  A medium Hemovac drain was placed  and there was no significant hemostasis required.  The knee was brought  to flexion.  Extensor mechanism was then reapproximated using a #1  Vicryl.  The remainder of the wound was closed with 2-0 Vicryl and  running 4-0  Monocryl.  The knee was cleaned, dried and dressed sterilely  with Steri-Strips and a sterile dressing.  He was then brought to the  recovery room in stable condition, tolerating the procedure well.      Madlyn Frankel Charlann Boxer, M.D.  Electronically Signed     MDO/MEDQ  D:  04/17/2008  T:  04/17/2008  Job:  [id number]

## 2011-02-04 NOTE — H&P (Signed)
NAME:  RJAY, REVOLORIO          ACCOUNT NO.:  [id number]   MEDICAL RECORD NO.:  [id number]          PATIENT TYPE:  WL   LOCATION:  1606                         FACILITY:  [facility]   PHYSICIAN:  Madlyn Frankel. Charlann Boxer, M.D.  DATE OF BIRTH:  05-29-1951   DATE OF ADMISSION:  04/17/2008  DATE OF DISCHARGE:  04/19/2008                              HISTORY & PHYSICAL   PROCEDURE:  Right total knee arthroplasty.   CHIEF COMPLAINT:  Right knee pain.   HISTORY OF PRESENT ILLNESS:  A 60 year old male with a history of right  knee pain secondary to osteoarthritis.  It has been refractory to all  conservative treatment including oral anti-inflammatories and cortisone  injection.  He has diminished quality of life and intractable pain.  He  has been presurgically assessed by Dr. Birdie Sons.   PAST MEDICAL HISTORY:  1. Significant for osteoarthritis.  2. Hypertension.  3. Reflux disease.   PAST SURGICAL HISTORY:  None.   FAMILY HISTORY:  Noncontributory.   SOCIAL HISTORY:  Divorced, nonsmoker.  Planning on going home after  surgery.   DRUG ALLERGIES:  No known drug allergies.   MEDICATIONS:  1. Unknown hypertension medicine.  2. Celebrex 200 mg p.o. b.i.d. for 2 weeks after surgery.   REVIEW OF SYSTEMS:  See HPI.   PHYSICAL EXAMINATION:  VITAL SIGNS:  Pulse 72, respirations 16, blood  pressure 138/86.  Height 6 feet, weight 220 pounds.  GENERAL:  Awake, alert and oriented, well-developed, well-nourished, no  acute distress.  NECK:  Supple.  No carotid bruits.  CHEST/LUNGS:  Clear to auscultation bilaterally.  BREASTS:  Deferred.  HEART:  Regular rate, S1 and S2 distinct.  ABDOMEN:  Soft, nontender, bowel sounds present.  GENITOURINARY:  Deferred.  EXTREMITIES:  Right knee varus with 5 degrees flexion contracture.  SKIN:  No cellulitis.  NEUROLOGIC:  Intact distal sensibilities.   LABORATORY DATA:  Labs, EKG, chest x-ray pending presurgical testing.   IMPRESSION:  Right knee  osteoarthritis.   PLAN OF ACTION:  Right total knee arthroplasty at [facility]  April 17, 2008 by surgeon Dr. Durene Romans.  Risks and complications were  discussed.   Postoperative medications including room Lovenox, Robaxin, iron,  aspirin, Colace, MiraLax provided at the time of history and physical.  In addition Celebrex was provided perioperative use including leading up  to and postoperatively for 2 weeks.  He is planning on going home for  physical therapy.     ______________________________  Yetta Glassman. [address], [place]      Madlyn Frankel. Charlann Boxer, M.D.  Electronically Signed    BLM/MEDQ  D:  04/05/2008  T:  04/05/2008  Job:  [id number]   cc:   Valetta Mole. Swords, MD  [address] [place]  [place] [address]

## 2011-02-07 NOTE — Assessment & Plan Note (Signed)
[facility]                           GASTROENTEROLOGY OFFICE NOTE   NAME:Pollard, Mark BAXENDALE                    MRN:          [id number]  DATE:07/21/2006                            DOB:          July 15, 1951    [name] is a 60 year old black male employee of [employer].  He is referred today because of a recent onset of solid food  dysphagia over the last 2 weeks, not alleviated with Nexium therapy.   I previously saw [name] for colonoscopy screening in May of 2004.  At that  time he had had a 75 year old brother who had died with colon cancer in a  car wreck.  At that time he had an adenomatous polyp that was removed, and  had left colon diverticulosis noted.  He really otherwise denied any GI  complaints, has been followed by Dr. Cato Pollard for the last 10 years.   His dysphagia is mostly for solid foods in the distal substernal area.  He  denies typical reflux symptoms or any hepatobiliary complaints.  It is of  note that he is a heavy drinker of alcohol, taking at least 1 to 2 6-packs  of beer a day, denies problems with hepatitis or pancreatitis.  He has had  no anorexia, weight loss or lower GI complaints at this time.  He denies  history of jaundice, pruritus or mental status changes.  He has never used  intravenous needles or illicit drugs.  He denies any social consequences  from drinking, such as DWIs, but he is not married.  He does have children  that are grown at this time.  He has not had previous endoscopic exams or  upper GI series.   PAST MEDICAL HISTORY:  Otherwise noncontributory.   MEDICATIONS:  He is on no medications except Nexium and p.r.n. Advil.   FAMILY HISTORY:  Remarkable for a brother who had colon cancer at age 19.   SOCIAL HISTORY:  The patient lives by himself, and works at [employer].  He has a 12th grade education.  He denies abuse of cigarettes, but  is a heavy abused of alcohol.   REVIEW OF SYSTEMS:  Noncontributory.   PHYSICAL EXAMINATION:  He is a healthy-appearing black male, appearing his  stated age.  He is 6 feet tall and weighs 210 pounds.  Blood pressure is 120/90 and pulse  was 60 and regular.  I could not appreciate stigmata of chronic liver disease or thyromegaly.  CHEST:  Clear, and there were no murmurs, gallops or rubs noted.  He  appeared to be in a regular rhythm.  ABDOMEN:  Showed no definite organomegaly, although I could feel his liver  tip in the right upper quadrant area.  No splenomegaly, other abdominal  masses or tenderness.  EXTREMITIES:  Were unremarkable.  Mental status was clear.  There was no asterixis.   ASSESSMENT:  1. Solid food dysphagia - rule out esophageal carcinoma versus peptic      stricture from acid reflux, although as mentioned above he has no      symptoms of acid reflux  disease.  2. History of colon polyps with family history of colon carcinoma.  The      patient is due for followup colonoscopy exam.  3. History of heavy ethanol abuse - rule out occult liver disease.   RECOMMENDATIONS:  1. Check liver profile, CDT level and CBC.  2. Upper abdominal ultrasound exam.  3. Outpatient endoscopy and possible dilatation, depending on clinical      findings.  4. Followup colonoscopy exam.  5. Continue followup with Dr. Cato Pollard, as previously planned.     Mark Rea. Jarold Motto, MD, Caleen Essex, FAGA  Electronically Signed    DRP/MedQ  DD: 07/21/2006  DT: 07/22/2006  Job #: [id number]   cc:   Mark Mole. Swords, MD

## 2011-02-07 NOTE — Discharge Summary (Signed)
[name], [name]             ACCOUNT NO.:  [id number]   MEDICAL RECORD NO.:  [id number]          PATIENT TYPE:  INP   LOCATION:  1606                         FACILITY:  [facility]   PHYSICIAN:  Madlyn Frankel. Charlann Boxer, M.D.  DATE OF BIRTH:  [birth year]   DATE OF ADMISSION:  04/17/2008  DATE OF DISCHARGE:  04/19/2008                               DISCHARGE SUMMARY   ADMISSION DIAGNOSES:  1. Osteoarthritis.  2. Hypertension.  3. Reflux disease.   DISCHARGE DIAGNOSES:  1. Osteoarthritis.  2. Hypertension.  3. Reflux disease.   HISTORY OF PRESENT ILLNESS:  A 60 year old male with a history of right  knee pain secondary to osteoarthritis refractory to all conservative  treatment.   PRIMARY CARE PHYSICIAN:  Valetta Mole. Swords, MD.   CONSULTATION:  None.   PROCEDURE:  Right total knee replacement by surgeon, Dr. Durene Romans.   LABORATORY DATA:  CBC with hemoglobin 14, hematocrit 40.2, platelets  242.  At time of discharge hemoglobin 9.6, hematocrit 27.1, platelets  168.  White cell differential showed neutrophils 41, lymphocytes 48, all  others normal.  Coagulation all within normal limits.  Routine chemistry  upon admission, no significant abnormalities.  At the time of discharge  sodium 135, potassium 4, glucose 116, BUN 5, creatinine 0.69.  Kidney  function with GFR greater than 60 with calcium 8.4 time of discharge.  UA was negative.   CARDIOLOGY:  EKG normal sinus rhythm.   Chest two-view, active cardiopulmonary disease.   HOSPITAL COURSE:  The patient went right total knee replacement,  admitted to the orthopedic floor in stable and improved condition.   His stay was unremarkable.  He remained afebrile and hemodynamically  stable throughout his course of stay.  His right knee was dry.  Neurovascularly intact throughout.  No sign of infection.  PT started  weightbearing as tolerated day #1, as well as DVT prophylaxis.  He made  excellent progress with his first day.  By day #2,  he had met all  functional criteria for discharge home with pain well-controlled.   DISCHARGE DISPOSITION:  Discharged home in stable and improved  condition.   DISCHARGE PHYSICAL THERAPY:  Weightbearing as tolerated with use of a  rolling walker.   DISCHARGE DIET:  Regular.   DISCHARGE WOUND CARE:  Keep dry.   DISCHARGE MEDICATIONS:  1. Lovenox 40 mg subcu q.24 h x11 days.  2. Robaxin 500 mg p.o. q.6 h.  3. Norco 7.5/325 one to two p.o. q.4-6 h p.r.n. pain.  4. Colace 7 mg p.o. b.i.d.  5. MiraLax 17 grams p.o. daily.  6. Lisinopril 10 mg p.o. q.a.m.  7. Omeprazole 20 mg one p.o. q.a.m.  8. Celebrex one p.o. b.i.d.  9. Enteric-coated aspirin 325 mg one p.o. daily x4 weeks after Lovenox      completed.   DISCHARGE FOLLOWUP:  Follow up with Dr. Charlann Boxer at phone number [phone] in  2 weeks.     ______________________________  Yetta Glassman [address], [place]      Madlyn Frankel. Charlann Boxer, M.D.  Electronically Signed    BLM/MEDQ  D:  05/09/2008  T:  05/09/2008  Job:  [id number]   cc:   Valetta Mole. Swords, MD  [address] [place]  [place] [address]

## 2011-02-07 NOTE — H&P (Signed)
NAMEOLLIVANDER, SEE             ACCOUNT NO.:  [id number]   MEDICAL RECORD NO.:  [id number]          PATIENT TYPE:  INP   LOCATION:  1606                         FACILITY:  [facility]   PHYSICIAN:  Madlyn Frankel. Charlann Boxer, M.D.  DATE OF BIRTH:  1950-12-30   DATE OF ADMISSION:  04/17/2008  DATE OF DISCHARGE:  04/19/2008                              HISTORY & PHYSICAL   PROCEDURE:  Right total knee arthroplasty.   CHIEF COMPLAINTS:  Right knee pain.   HISTORY OF PRESENT ILLNESS:  A 60 year old male with a history right  knee pain secondary to osteoarthritis.  It has been refractory to all  conservative including oral anti-inflammatories and cortisone injection.  Presurgically assessed by his primary care physician, Dr. Birdie Sons.   PAST MEDICAL HISTORY:  1. Significant for osteoarthritis.  2. Hypertension.  3. Reflux disease.   PAST SURGICAL HISTORY:  None.   FAMILY HISTORY:  None.   SOCIAL HISTORY:  Divorced.  Social alcohol.  Nonsmoker.  Primary care  giver will be in the home.   DRUG ALLERGIES:  No known drug allergies.   MEDICATIONS:  1. Lisinopril 10 mg p.o. daily.  2. Omeprazole 20 mg 1 p.o. daily p.r.n.  3. Celebrex 1 p.o. b.i.d.   REVIEW OF SYSTEMS:  See HPI.   PHYSICAL EXAMINATION:  VITAL SIGNS:  Pulse 72, respirations 16, blood  pressure 138/86.  Height 6 feet, weight 220.  GENERAL:  Awake, alert and oriented, well-developed, well-nourished.  NECK:  Supple.  No carotid bruits.  CHEST/LUNGS:  Clear to auscultation bilaterally.  BREASTS:  Deferred.  HEART:  Regular rate and rhythm.  S1, S2 distinct.  ABDOMEN:  Soft, nontender, nondistended.  Bowel sounds present.  GENITOURINARY:  Deferred.  EXTREMITIES:  Right knee has 5 degree flexion contracture.  SKIN:  No cellulitis.  NEUROLOGIC:  Intact distal sensibilities.   LABS:  EKG, chest x-ray all pending presurgical testing.   IMPRESSION:  Right knee osteoarthritis.   PLAN OF ACTION:  Right total knee arthroplasty  at [facility]  April 17, 2008 by surgeon Dr. Durene Romans.  Risks and complications were  discussed.   Postoperative medications were provided at the time of history and  physical including Lovenox, Robaxin, iron, aspirin, Colace, __________.  Celebrex will be utilized perioperatively as well.  Pain medicines will  be dispensed at time of surgery.     ______________________________  Yetta Glassman [address], [place]      Madlyn Frankel. Charlann Boxer, M.D.  Electronically Signed    BLM/MEDQ  D:  05/09/2008  T:  05/09/2008  Job:  [id number]   cc:   Valetta Mole. Swords, MD  [address] [place]  [place] [address]

## 2011-03-21 IMAGING — CT CT MAXILLOFACIAL W/O CM
2 of 3 series · 16 of 30 positions shown, 19 images · non-contrast
Comparison: 06/19/2010

CLINICAL DATA: Assess right frontal sinus for ventilation.

CT MAXILLOFACIAL WITHOUT CONTRAST
TECHNIQUE: Multidetector CT imaging of the maxillofacial
structures was performed. Multiplanar CT image reconstructions were
also generated.

[Series 200: cor · coronal · 0.33mm/px · 6 of 65 slices shown]
[im 7/65  bone]
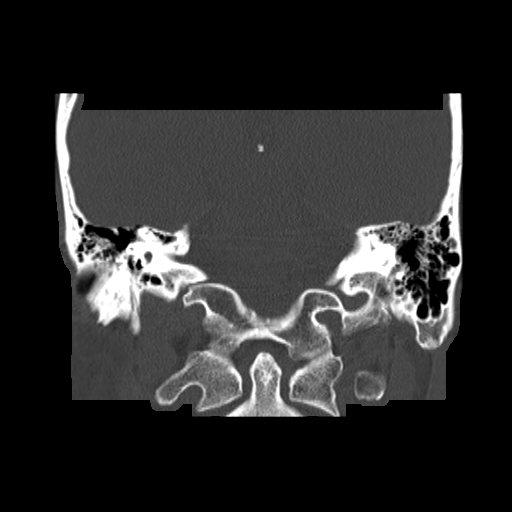
[im 13/65  bone]
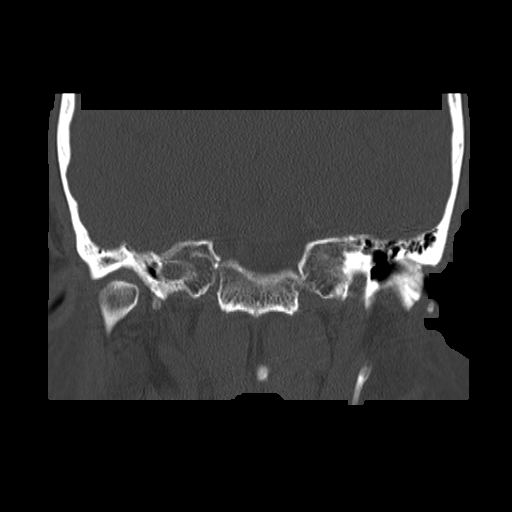
[im 20/65  bone]
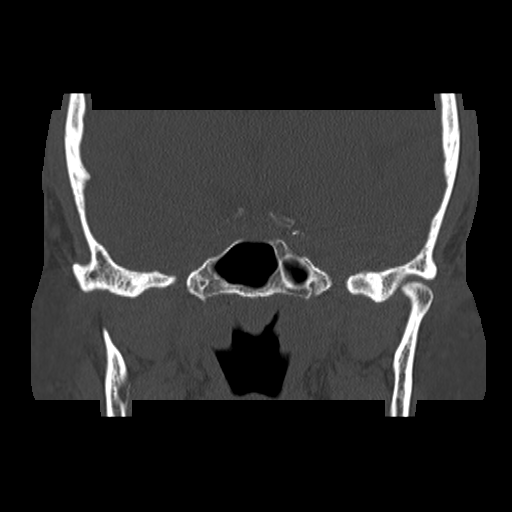
[im 26/65  bone]
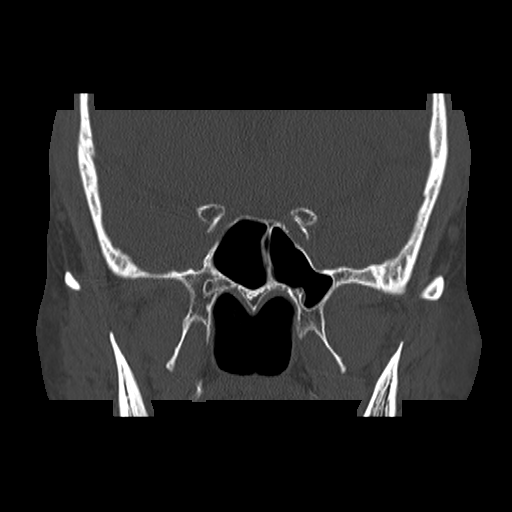
[im 39/65  bone]
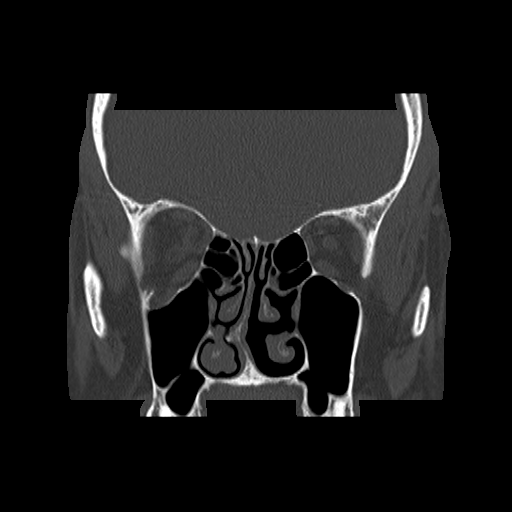
[im 45/65  bone]
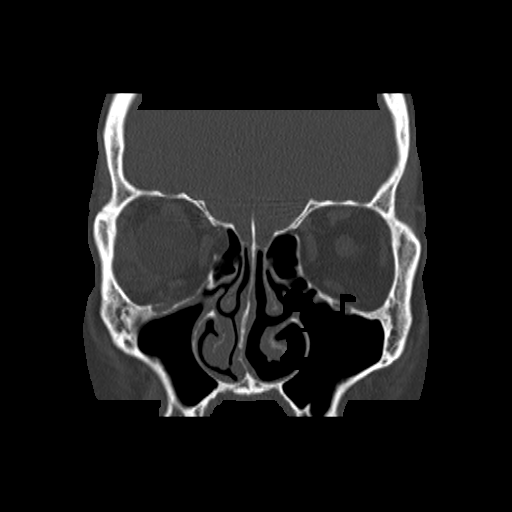

[Series 201: sag · sagittal · 0.33mm/px · 10 of 75 slices shown, 13 images]
[im 7/75  brain]
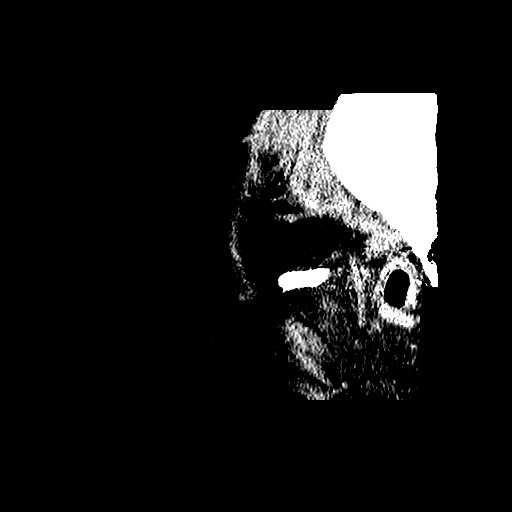
[im 7/75  bone]
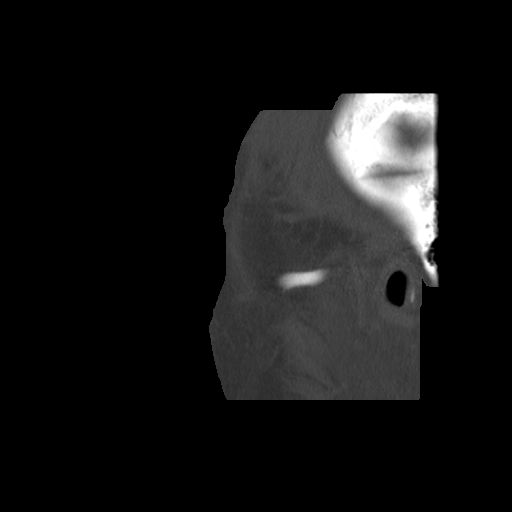
[im 14/75  bone]
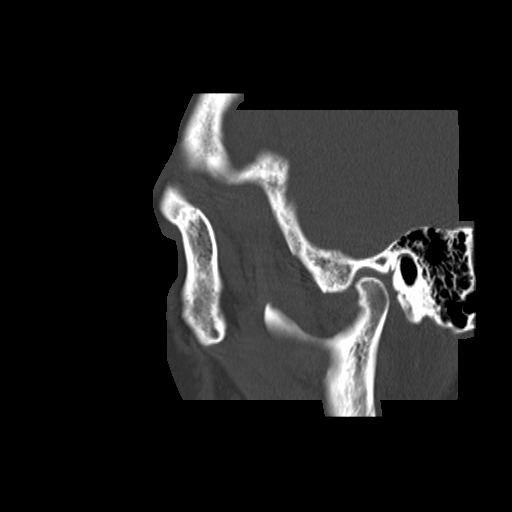
[im 21/75  bone]
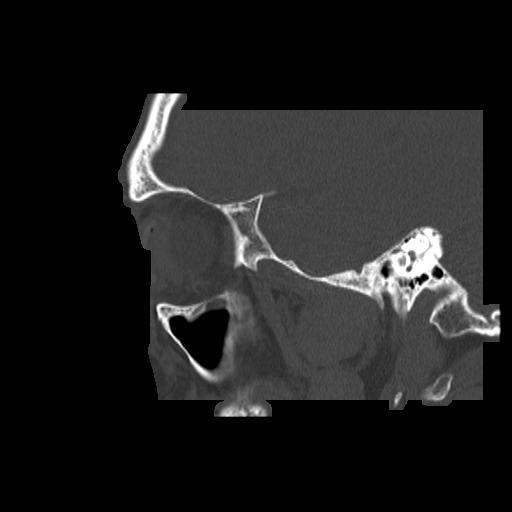
[im 27/75  bone]
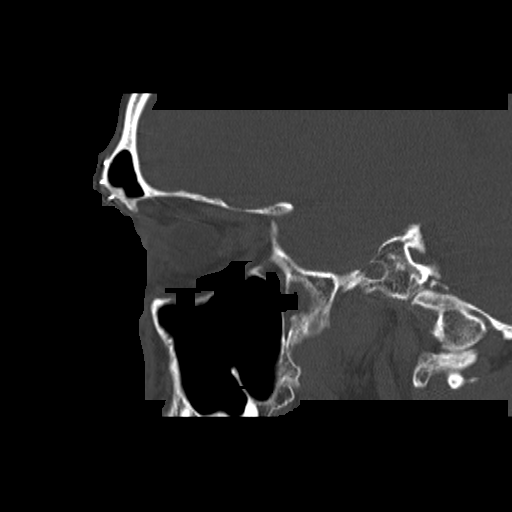
[im 34/75  brain]
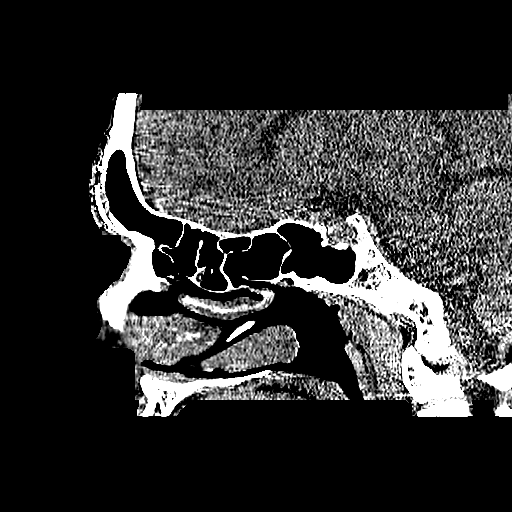
[im 34/75  bone]
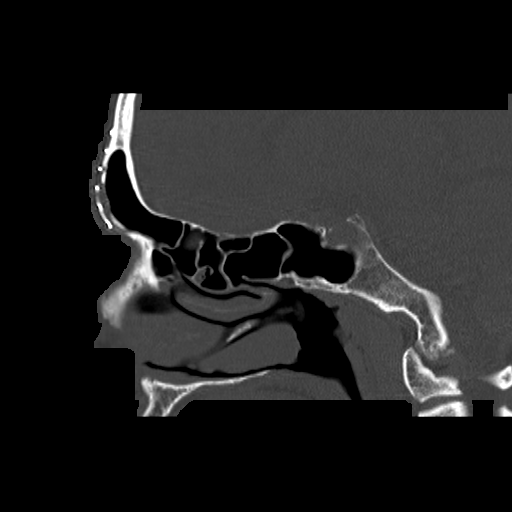
[im 41/75  bone]
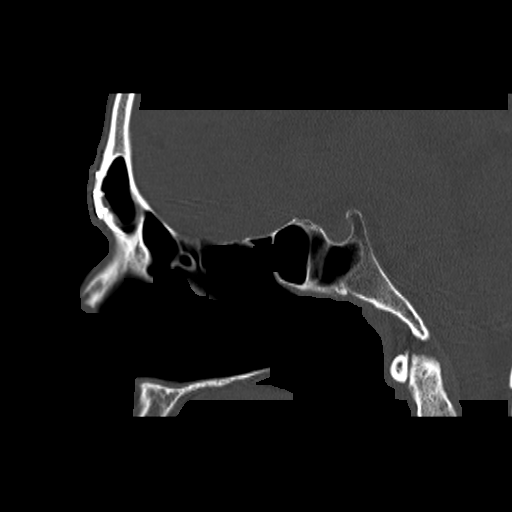
[im 48/75  bone]
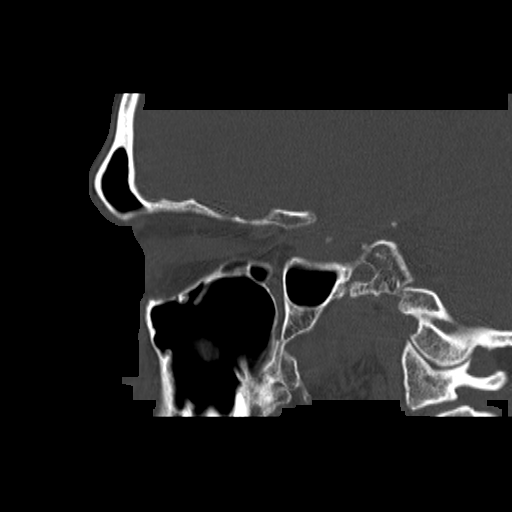
[im 54/75  bone]
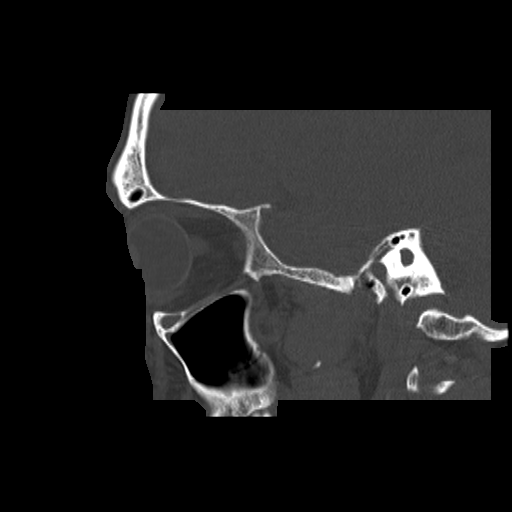
[im 61/75  brain]
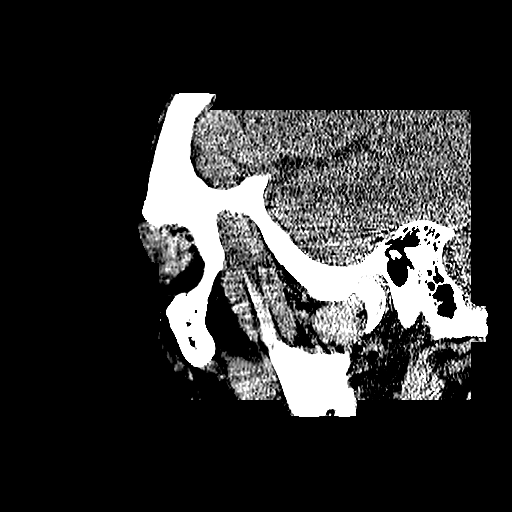
[im 61/75  bone]
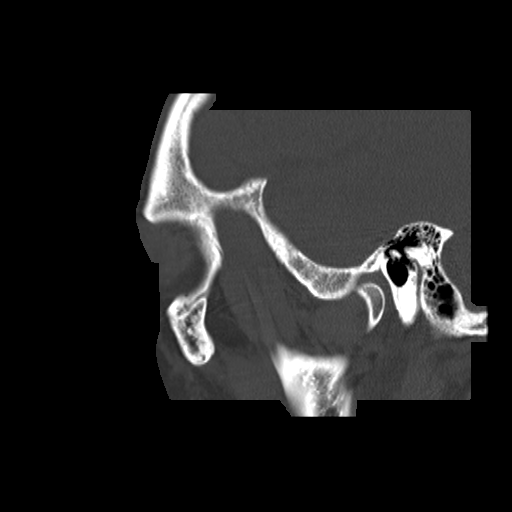
[im 68/75  bone]
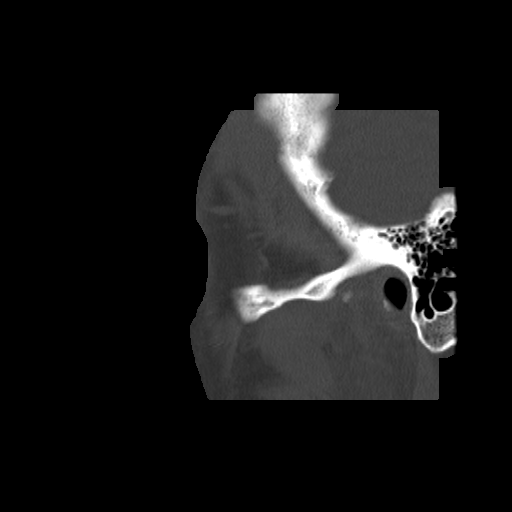

[16 of 30 positions shown; findings below may reference images not displayed]

FINDINGS: Postoperative changes are noted in the right orbital and
right frontal sinus region.  Right globe prosthesis noted.  Plate
screw fixation of the anterior wall of the right frontal sinus
noted.  There appears to be adequate ventilation of the right
frontal sinus.  A small amount of mucoperiosteal thickening noted
in the left frontal sinus.  No air fluid levels in the sinuses.
Changes from prior right medial orbital wall blowout fracture
noted.  Irregularity along the superior medial wall of the right
orbit at the frontal sinus persists, but there is near anatomic
alignment.
IMPRESSION: Postoperative changes in the right orbit and right frontal sinus
region.  Adequate ventilation of the right frontal sinus.  Minimal
chronic sinusitis changes present.

## 2011-06-20 LAB — CBC
HCT: 27.1 — ABNORMAL LOW
Hemoglobin: 14
Hemoglobin: 9.6 — ABNORMAL LOW
Hemoglobin: 9.8 — ABNORMAL LOW
MCHC: 35
RBC: 3.25 — ABNORMAL LOW
RDW: 13.5
RDW: 13.6
RDW: 13.9
WBC: 8.5

## 2011-06-20 LAB — URINALYSIS, ROUTINE W REFLEX MICROSCOPIC
Glucose, UA: NEGATIVE
Ketones, ur: NEGATIVE
Nitrite: NEGATIVE
Protein, ur: NEGATIVE
Urobilinogen, UA: 0.2

## 2011-06-20 LAB — BASIC METABOLIC PANEL
BUN: 15
CO2: 23
Calcium: 8.1 — ABNORMAL LOW
Calcium: 8.9
GFR calc Af Amer: >60
GFR calc non Af Amer: >60
GFR calc non Af Amer: >60
Glucose, Bld: 116 — ABNORMAL HIGH
Glucose, Bld: 116 — ABNORMAL HIGH
Potassium: 4
Sodium: 135
Sodium: 136
Sodium: 136

## 2011-06-20 LAB — DIFFERENTIAL
Basophils Absolute: 0
Basophils Relative: 0
Eosinophils Relative: 4
Monocytes Absolute: 0.4
Neutro Abs: 2.2

## 2011-06-20 LAB — TYPE AND SCREEN: ABO/RH(D): B NEG

## 2011-06-20 LAB — APTT: aPTT: 25

## 2011-06-20 LAB — ABO/RH: ABO/RH(D): B NEG

## 2011-11-19 ENCOUNTER — Encounter: Payer: Self-pay | Admitting: Gastroenterology

## 2011-12-15 ENCOUNTER — Encounter: Payer: Self-pay | Admitting: Gastroenterology

## 2011-12-22 ENCOUNTER — Ambulatory Visit (AMBULATORY_SURGERY_CENTER): Payer: BC Managed Care – PPO | Admitting: *Deleted

## 2011-12-22 VITALS — Ht 72.0 in | Wt 233.0 lb

## 2011-12-22 DIAGNOSIS — Z800 Family history of malignant neoplasm of digestive organs: Secondary | ICD-10-CM

## 2011-12-22 DIAGNOSIS — Z1211 Encounter for screening for malignant neoplasm of colon: Secondary | ICD-10-CM

## 2011-12-22 MED ORDER — PEG-KCL-NACL-NASULF-NA ASC-C 100 G PO SOLR: ORAL | Status: DC

## 2011-12-23 ENCOUNTER — Encounter: Payer: Self-pay | Admitting: Gastroenterology

## 2011-12-30 ENCOUNTER — Ambulatory Visit: Payer: Self-pay | Admitting: Internal Medicine

## 2012-01-02 ENCOUNTER — Ambulatory Visit (AMBULATORY_SURGERY_CENTER): Payer: BC Managed Care – PPO | Admitting: Gastroenterology

## 2012-01-02 ENCOUNTER — Encounter: Payer: Self-pay | Admitting: Gastroenterology

## 2012-01-02 VITALS — BP 117/74 | HR 88 | Temp 96.8°F | Resp 16 | Ht 72.0 in | Wt 233.0 lb

## 2012-01-02 DIAGNOSIS — D126 Benign neoplasm of colon, unspecified: Secondary | ICD-10-CM

## 2012-01-02 DIAGNOSIS — K5730 Diverticulosis of large intestine without perforation or abscess without bleeding: Secondary | ICD-10-CM

## 2012-01-02 DIAGNOSIS — Z800 Family history of malignant neoplasm of digestive organs: Secondary | ICD-10-CM

## 2012-01-02 DIAGNOSIS — Z1211 Encounter for screening for malignant neoplasm of colon: Secondary | ICD-10-CM

## 2012-01-02 MED ORDER — SODIUM CHLORIDE 0.9 % IV SOLN: 500.0000 mL | INTRAVENOUS | Status: DC

## 2012-01-02 NOTE — Progress Notes (Signed)
Patient did not have preoperative order for IV antibiotic SSI prophylaxis. (G8918)  Patient did not experience any of the following events: a burn prior to discharge; a fall within the facility; wrong site/side/patient/procedure/implant event; or a hospital transfer or hospital admission upon discharge from the facility. (G8907)  

## 2012-01-02 NOTE — Progress Notes (Signed)
COLONOSCOPE CHANGED TO ADULT SCOPE.

## 2012-01-02 NOTE — Op Note (Signed)
[facility] [address] [place], [place]  [address]  COLONOSCOPY PROCEDURE REPORT  PATIENT:  Mark, Pollard  MR#:  [id number] BIRTHDATE:  Jan 04, 1951, 61 yrs. old  GENDER:  male ENDOSCOPIST:  Vania Rea. Jarold Motto, MD, [facility] REF. BY: PROCEDURE DATE:  01/02/2012 PROCEDURE:  Colonoscopy with snare polypectomy ASA CLASS:  Class II INDICATIONS:  history of pre-cancerous (adenomatous) colon polyps  MEDICATIONS:   propofol (Diprivan) 600 mg IV  DESCRIPTION OF PROCEDURE:   After the risks and benefits and of the procedure were explained, informed consent was obtained. Digital rectal exam was performed and revealed no abnormalities. The LB PCF-H180AL [id number] endoscope was introduced through the anus and advanced to the cecum, which was identified by both the appendix and ileocecal valve.  The quality of the prep was excellent, using MoviPrep.  The instrument was then slowly withdrawn as the colon was fully examined. <<PROCEDUREIMAGES>>  FINDINGS:  ENDOSCOPIC FINDINGS:   A pedunculated polyp was found in the sigmoid colon. .5 CM VASCULAR POLYP HOT SNARE REMOVED.VERY DIFFICULT PER ANATOMY.GREATER THAN 30 MT WITHDRAWAL!!!! Severe diverticulosis was found in the sigmoid to descending colon segments. EXTREMELY GARD EXAM PER TORTUOUS SIGMOID WITH GRADE 4 TICS.IV GLUCAGON GIVEN !!!  Two polyps were found in the distal transverse colon. SMALLER POLYPS COLD SNARE REMOVED.   Retroflexed views in the rectum revealed no abnormalities.    The scope was then withdrawn from the patient and the procedure completed.  COMPLICATIONS:  None ENDOSCOPIC IMPRESSION: 1) Severe diverticulosis in the sigmoid to descending colon segments 2) Two polyps in the distal transverse colon 3) Pedunculated polyp in the sigmoid colon RECOMMENDATIONS: 1) Repeat colonoscopy in 5 years if polyp adenomatous; otherwise 10 years 2) High fiber diet.  REPEAT EXAM:  No  ______________________________ Vania Rea.  Jarold Motto, MD, [facility]  CC:  Lindley Magnus, MD  n. Rosalie DoctorVania Rea. Brynnlie Unterreiner at 01/02/2012 03:21 PM  Arline Asp, [phone]

## 2012-01-02 NOTE — Patient Instructions (Signed)

## 2012-01-05 ENCOUNTER — Telehealth: Payer: Self-pay | Admitting: *Deleted

## 2012-01-05 NOTE — Telephone Encounter (Signed)
  Follow up Call-  Call back number 01/02/2012  Post procedure Call Back phone  # [phone]  Permission to leave phone message Yes     Left message that we called to f/u

## 2012-01-09 ENCOUNTER — Encounter: Payer: Self-pay | Admitting: Gastroenterology

## 2012-01-13 ENCOUNTER — Other Ambulatory Visit: Payer: Self-pay | Admitting: Cardiovascular Disease

## 2012-01-19 ENCOUNTER — Telehealth: Payer: Self-pay | Admitting: Internal Medicine

## 2012-01-19 MED ORDER — LISINOPRIL-HYDROCHLOROTHIAZIDE 20-25 MG PO TABS: 1.0000 | ORAL_TABLET | Freq: Every day | ORAL | Status: DC

## 2012-01-19 NOTE — Telephone Encounter (Signed)
Patient's daughter called that her dad need a refill of his lisinopril hydrochlorothiazide. Please assist.

## 2012-01-28 ENCOUNTER — Ambulatory Visit (INDEPENDENT_AMBULATORY_CARE_PROVIDER_SITE_OTHER): Payer: BC Managed Care – PPO | Admitting: Internal Medicine

## 2012-01-28 ENCOUNTER — Encounter: Payer: Self-pay | Admitting: Internal Medicine

## 2012-01-28 VITALS — BP 128/88 | HR 76 | Temp 98.1°F | Ht 72.0 in | Wt 234.0 lb

## 2012-01-28 DIAGNOSIS — N410 Acute prostatitis: Secondary | ICD-10-CM

## 2012-01-28 DIAGNOSIS — I10 Essential (primary) hypertension: Secondary | ICD-10-CM

## 2012-01-28 DIAGNOSIS — K219 Gastro-esophageal reflux disease without esophagitis: Secondary | ICD-10-CM

## 2012-01-28 DIAGNOSIS — E785 Hyperlipidemia, unspecified: Secondary | ICD-10-CM

## 2012-01-28 DIAGNOSIS — E663 Overweight: Secondary | ICD-10-CM | POA: Insufficient documentation

## 2012-01-28 LAB — CBC WITH DIFFERENTIAL/PLATELET
Basophils Absolute: 0 10*3/uL (ref 0.0–0.1)
Eosinophils Absolute: 0.3 10*3/uL (ref 0.0–0.7)
HCT: 39.8 % (ref 39.0–52.0)
Hemoglobin: 13.7 g/dL (ref 13.0–17.0)
Lymphs Abs: 2.4 10*3/uL (ref 0.7–4.0)
MCHC: 34.5 g/dL (ref 30.0–36.0)
MCV: 89.2 fl (ref 78.0–100.0)
Neutro Abs: 2.3 10*3/uL (ref 1.4–7.7)
RDW: 13 % (ref 11.5–14.6)

## 2012-01-28 LAB — HEPATIC FUNCTION PANEL
ALT: 19 U/L (ref 0–53)
Albumin: 4.1 g/dL (ref 3.5–5.2)
Total Bilirubin: 0.6 mg/dL (ref 0.3–1.2)
Total Protein: 7.5 g/dL (ref 6.0–8.3)

## 2012-01-28 LAB — BASIC METABOLIC PANEL
BUN: 20 mg/dL (ref 6–23)
CO2: 27 mEq/L (ref 19–32)
Chloride: 100 mEq/L (ref 96–112)
Creatinine, Ser: 1 mg/dL (ref 0.4–1.5)

## 2012-01-28 LAB — LIPID PANEL
Cholesterol: 267 mg/dL — ABNORMAL HIGH (ref 0–200)
Triglycerides: 885 mg/dL — ABNORMAL HIGH (ref 0.0–149.0)

## 2012-01-28 NOTE — Progress Notes (Signed)
Patient ID: [name], male   DOB: [birth year], 61 y.o.   MRN: [id number]  htn--- tolerating meds  Lipids-- he has stopped statin  GERD--- no sxs on PPI  Past Medical History  Diagnosis Date  . Hyperlipidemia   . Hypertension   . GERD (gastroesophageal reflux disease)   . Bladder neck obstruction   . Sleep apnea     does not use machine per pt.    History   Social History  . Marital Status: Divorced    Spouse Name: N/A    Number of Children: N/A  . Years of Education: N/A   Occupational History  . Not on file.   Social History Main Topics  . Smoking status: Former Smoker    Quit date: 09/23/2011  . Smokeless tobacco: Never Used  . Alcohol Use: 25.2 oz/week    42 Cans of beer per week     6 pack of beer daily  . Drug Use: No  . Sexually Active:    Other Topics Concern  . Not on file   Social History Narrative  . No narrative on file    Past Surgical History  Procedure Date  . Elbow surgery   . Fractured radius surgery   . Arthroscopic knee surgery   . Right knee tka   . Total knee arthroplasty   . Traumatic eye enucleation 2011    right    Family History  Problem Relation Age of Onset  . Colon cancer Brother 78    No Known Allergies  Current Outpatient Prescriptions on File Prior to Visit  Medication Sig Dispense Refill  . aspirin 325 MG tablet Take 325 mg by mouth daily. For pain       . lisinopril-hydrochlorothiazide (PRINZIDE,ZESTORETIC) 20-25 MG per tablet Take 1 tablet by mouth daily.  90 tablet  1  . LUMIGAN 0.01 % SOLN Place 1 drop into the left eye At bedtime.      Marland Kitchen omeprazole (PRILOSEC) 20 MG capsule Take 20 mg by mouth daily.         patient denies chest pain, shortness of breath, orthopnea. Denies lower extremity edema, abdominal pain, change in appetite, change in bowel movements. Patient denies rashes, musculoskeletal complaints. No other specific complaints in a complete review of systems.   BP 128/88  Pulse 76  Temp(Src)  98.1 F (36.7 C) (Oral)  Ht 6' (1.829 m)  Wt 234 lb (106.142 kg)  BMI 31.74 kg/m2  well-developed well-nourished male in no acute distress. HEENT exam atraumatic, normocephalic, neck supple without jugular venous distention. Chest clear to auscultation cardiac exam S1-S2 are regular. Abdominal exam overweight with bowel sounds, soft and nontender. Extremities no edema. Neurologic exam is alert with a normal gait.

## 2012-01-28 NOTE — Assessment & Plan Note (Signed)
Continue current meds Check cbc

## 2012-01-28 NOTE — Assessment & Plan Note (Signed)
Will almost certainly need treatment Check labs today Check tsh

## 2012-01-28 NOTE — Assessment & Plan Note (Signed)
Fair control conitnue current meds

## 2012-01-28 NOTE — Progress Notes (Signed)
Addended by: Bonnye Fava on: 01/28/2012 08:46 AM   Modules accepted: Orders

## 2012-02-02 ENCOUNTER — Telehealth: Payer: Self-pay | Admitting: Internal Medicine

## 2012-02-02 ENCOUNTER — Other Ambulatory Visit: Payer: Self-pay | Admitting: *Deleted

## 2012-02-02 MED ORDER — ATORVASTATIN CALCIUM 40 MG PO TABS: 40.0000 mg | ORAL_TABLET | Freq: Every day | ORAL | Status: DC

## 2012-02-02 NOTE — Telephone Encounter (Signed)
See result note.  

## 2012-02-02 NOTE — Telephone Encounter (Signed)
Returning call to [facility] in regards to lab results.

## 2012-04-06 ENCOUNTER — Ambulatory Visit (INDEPENDENT_AMBULATORY_CARE_PROVIDER_SITE_OTHER): Payer: BC Managed Care – PPO | Admitting: Internal Medicine

## 2012-04-06 VITALS — BP 142/95 | HR 84 | Temp 97.4°F | Wt 231.0 lb

## 2012-04-06 DIAGNOSIS — E785 Hyperlipidemia, unspecified: Secondary | ICD-10-CM

## 2012-04-06 DIAGNOSIS — I10 Essential (primary) hypertension: Secondary | ICD-10-CM

## 2012-04-06 LAB — BASIC METABOLIC PANEL WITH GFR
BUN: 17 mg/dL (ref 6–23)
CO2: 24 meq/L (ref 19–32)
Calcium: 9.3 mg/dL (ref 8.4–10.5)
Chloride: 103 meq/L (ref 96–112)
Creatinine, Ser: 0.9 mg/dL (ref 0.4–1.5)
GFR: 107.82 mL/min
Glucose, Bld: 111 mg/dL — ABNORMAL HIGH (ref 70–99)
Potassium: 3.7 meq/L (ref 3.5–5.1)
Sodium: 139 meq/L (ref 135–145)

## 2012-04-06 LAB — LIPID PANEL
Cholesterol: 226 mg/dL — ABNORMAL HIGH (ref 0–200)
HDL: 47.8 mg/dL
Total CHOL/HDL Ratio: 5
Triglycerides: 1135 mg/dL — ABNORMAL HIGH (ref 0.0–149.0)
VLDL: 227 mg/dL — ABNORMAL HIGH (ref 0.0–40.0)

## 2012-04-06 LAB — HEPATIC FUNCTION PANEL
ALT: 37 U/L (ref 0–53)
AST: 28 U/L (ref 0–37)
Albumin: 4.1 g/dL (ref 3.5–5.2)
Alkaline Phosphatase: 45 U/L (ref 39–117)
Bilirubin, Direct: 0 mg/dL (ref 0.0–0.3)
Total Bilirubin: 0.4 mg/dL (ref 0.3–1.2)
Total Protein: 7.6 g/dL (ref 6.0–8.3)

## 2012-04-06 MED ORDER — AMLODIPINE BESYLATE 5 MG PO TABS: 5.0000 mg | ORAL_TABLET | Freq: Every day | ORAL | Status: DC

## 2012-04-06 MED ORDER — ATORVASTATIN CALCIUM 40 MG PO TABS: 40.0000 mg | ORAL_TABLET | Freq: Every day | ORAL | Status: DC

## 2012-04-06 NOTE — Progress Notes (Signed)
Patient ID: [name], male   DOB: 11/05/1950, 61 y.o.   MRN: [id number] htn-- tolerating meds. No home BPs  GERD- no sxs  Lipids-- noncompliant with lipitor  Weight -  Wt Readings from Last 3 Encounters:  04/06/12 231 lb (104.781 kg)  01/28/12 234 lb (106.142 kg)  01/02/12 233 lb (105.688 kg)    Past Medical History  Diagnosis Date  . Hyperlipidemia   . Hypertension   . GERD (gastroesophageal reflux disease)   . Bladder neck obstruction   . Sleep apnea     does not use machine per pt.    History   Social History  . Marital Status: Divorced    Spouse Name: N/A    Number of Children: N/A  . Years of Education: N/A   Occupational History  . Not on file.   Social History Main Topics  . Smoking status: Former Smoker    Quit date: 09/23/2011  . Smokeless tobacco: Never Used  . Alcohol Use: 25.2 oz/week    42 Cans of beer per week     6 pack of beer daily  . Drug Use: No  . Sexually Active:    Other Topics Concern  . Not on file   Social History Narrative  . No narrative on file    Past Surgical History  Procedure Date  . Elbow surgery   . Fractured radius surgery   . Arthroscopic knee surgery   . Right knee tka   . Total knee arthroplasty   . Traumatic eye enucleation 2011    right    Family History  Problem Relation Age of Onset  . Colon cancer Brother 17    No Known Allergies  Current Outpatient Prescriptions on File Prior to Visit  Medication Sig Dispense Refill  . lisinopril-hydrochlorothiazide (PRINZIDE,ZESTORETIC) 20-25 MG per tablet Take 1 tablet by mouth daily.  90 tablet  1  . LUMIGAN 0.01 % SOLN Place 1 drop into the left eye At bedtime.      Marland Kitchen omeprazole (PRILOSEC) 20 MG capsule Take 20 mg by mouth daily.         patient denies chest pain, shortness of breath, orthopnea. Denies lower extremity edema, abdominal pain, change in appetite, change in bowel movements. Patient denies rashes, musculoskeletal complaints. No other specific  complaints in a complete review of systems.   BP 154/92  Pulse 84  Temp 97.4 F (36.3 C) (Oral)  Wt 231 lb (104.781 kg) Past Medical History  Diagnosis Date  . Hyperlipidemia   . Hypertension   . GERD (gastroesophageal reflux disease)   . Bladder neck obstruction   . Sleep apnea     does not use machine per pt.    History   Social History  . Marital Status: Divorced    Spouse Name: N/A    Number of Children: N/A  . Years of Education: N/A   Occupational History  . Not on file.   Social History Main Topics  . Smoking status: Former Smoker    Quit date: 09/23/2011  . Smokeless tobacco: Never Used  . Alcohol Use: 25.2 oz/week    42 Cans of beer per week     6 pack of beer daily  . Drug Use: No  . Sexually Active:    Other Topics Concern  . Not on file   Social History Narrative  . No narrative on file    Past Surgical History  Procedure Date  . Elbow surgery   .  Fractured radius surgery   . Arthroscopic knee surgery   . Right knee tka   . Total knee arthroplasty   . Traumatic eye enucleation 2011    right    Family History  Problem Relation Age of Onset  . Colon cancer Brother 42    No Known Allergies  Current Outpatient Prescriptions on File Prior to Visit  Medication Sig Dispense Refill  . lisinopril-hydrochlorothiazide (PRINZIDE,ZESTORETIC) 20-25 MG per tablet Take 1 tablet by mouth daily.  90 tablet  1  . LUMIGAN 0.01 % SOLN Place 1 drop into the left eye At bedtime.      Marland Kitchen omeprazole (PRILOSEC) 20 MG capsule Take 20 mg by mouth daily.         patient denies chest pain, shortness of breath, orthopnea. Denies lower extremity edema, abdominal pain, change in appetite, change in bowel movements. Patient denies rashes, musculoskeletal complaints. No other specific complaints in a complete review of systems.   BP 154/92  Pulse 84  Temp 97.4 F (36.3 C) (Oral)  Wt 231 lb (104.781 kg)  well-developed well-nourished male in no acute distress.  HEENT exam atraumatic, normocephalic, neck supple without jugular venous distention. Chest clear to auscultation cardiac exam S1-S2 are regular. Abdominal exam overweight with bowel sounds, soft and nontender. Extremities no edema. Neurologic exam is alert with a normal gait.

## 2012-04-06 NOTE — Assessment & Plan Note (Addendum)
He has not been compliant-- after I showed him the AVS he is now convinced that he has been on lipitor. Given that, I'm going to check labs today.  Discussed statins and that their major side effect is preventing MIs. He agrees to take Check labs in 3 months-- on second thought, I'll check today

## 2012-04-06 NOTE — Addendum Note (Signed)
Addended by: Rita Ohara R on: 04/06/2012 09:06 AM   Modules accepted: Orders

## 2012-04-06 NOTE — Assessment & Plan Note (Signed)
Not well controlled. Needs additional meds Add amlodipine

## 2012-04-13 NOTE — Progress Notes (Signed)
Left message on machine for pt to return call.

## 2012-04-16 ENCOUNTER — Other Ambulatory Visit: Payer: Self-pay | Admitting: *Deleted

## 2012-04-16 DIAGNOSIS — E785 Hyperlipidemia, unspecified: Secondary | ICD-10-CM

## 2012-04-16 MED ORDER — ROSUVASTATIN CALCIUM 20 MG PO TABS: 20.0000 mg | ORAL_TABLET | Freq: Every day | ORAL | Status: DC

## 2012-04-19 ENCOUNTER — Other Ambulatory Visit: Payer: Self-pay | Admitting: *Deleted

## 2012-04-19 MED ORDER — LISINOPRIL-HYDROCHLOROTHIAZIDE 20-25 MG PO TABS: 1.0000 | ORAL_TABLET | Freq: Every day | ORAL | Status: DC

## 2012-10-26 ENCOUNTER — Other Ambulatory Visit: Payer: Self-pay | Admitting: Internal Medicine

## 2013-04-22 ENCOUNTER — Other Ambulatory Visit: Payer: Self-pay | Admitting: Internal Medicine

## 2013-04-25 ENCOUNTER — Other Ambulatory Visit: Payer: Self-pay | Admitting: Internal Medicine

## 2013-04-27 ENCOUNTER — Other Ambulatory Visit: Payer: Self-pay | Admitting: Internal Medicine

## 2013-04-27 ENCOUNTER — Telehealth: Payer: Self-pay | Admitting: Internal Medicine

## 2013-04-27 NOTE — Telephone Encounter (Signed)
Pt needs OV for fup on meds. Do you want him to see Padonda or schedule in Oct w/ Dr Cato Mulligan?

## 2013-04-27 NOTE — Telephone Encounter (Signed)
Pt needs CRESTOR 20 MG tablet 90 day to get him through to his appt 10/17 @ 8am. [facility]

## 2013-04-27 NOTE — Telephone Encounter (Signed)
You can schedule with Dr Cato Mulligan and I will give him enough refills to get through till his appt

## 2013-04-28 MED ORDER — ROSUVASTATIN CALCIUM 20 MG PO TABS: ORAL_TABLET | ORAL | Status: DC

## 2013-04-28 NOTE — Telephone Encounter (Signed)
Rx sent 

## 2013-07-08 ENCOUNTER — Ambulatory Visit (INDEPENDENT_AMBULATORY_CARE_PROVIDER_SITE_OTHER): Payer: BC Managed Care – PPO | Admitting: Internal Medicine

## 2013-07-08 ENCOUNTER — Encounter: Payer: Self-pay | Admitting: Internal Medicine

## 2013-07-08 VITALS — BP 124/86 | HR 84 | Temp 97.8°F | Ht 72.0 in | Wt 238.0 lb

## 2013-07-08 DIAGNOSIS — K219 Gastro-esophageal reflux disease without esophagitis: Secondary | ICD-10-CM

## 2013-07-08 DIAGNOSIS — Z2911 Encounter for prophylactic immunotherapy for respiratory syncytial virus (RSV): Secondary | ICD-10-CM

## 2013-07-08 DIAGNOSIS — E669 Obesity, unspecified: Secondary | ICD-10-CM | POA: Insufficient documentation

## 2013-07-08 DIAGNOSIS — Z23 Encounter for immunization: Secondary | ICD-10-CM

## 2013-07-08 DIAGNOSIS — Z0000 Encounter for general adult medical examination without abnormal findings: Secondary | ICD-10-CM

## 2013-07-08 DIAGNOSIS — G4730 Sleep apnea, unspecified: Secondary | ICD-10-CM

## 2013-07-08 DIAGNOSIS — E785 Hyperlipidemia, unspecified: Secondary | ICD-10-CM

## 2013-07-08 DIAGNOSIS — I10 Essential (primary) hypertension: Secondary | ICD-10-CM

## 2013-07-08 LAB — BASIC METABOLIC PANEL
BUN: 22 mg/dL (ref 6–23)
Creatinine, Ser: 0.9 mg/dL (ref 0.4–1.5)
GFR: 106.04 mL/min (ref >60.00)
Glucose, Bld: 108 mg/dL — ABNORMAL HIGH (ref 70–99)

## 2013-07-08 LAB — LIPID PANEL
Cholesterol: 201 mg/dL — ABNORMAL HIGH (ref 0–200)
HDL: 46.6 mg/dL (ref >39.00)
VLDL: 148.4 mg/dL — ABNORMAL HIGH (ref 0.0–40.0)

## 2013-07-08 LAB — CBC WITH DIFFERENTIAL/PLATELET
Basophils Absolute: 0 10*3/uL (ref 0.0–0.1)
Basophils Relative: 0.6 % (ref 0.0–3.0)
Eosinophils Absolute: 0.3 10*3/uL (ref 0.0–0.7)
Hemoglobin: 14.3 g/dL (ref 13.0–17.0)
Lymphocytes Relative: 34.3 % (ref 12.0–46.0)
MCHC: 34.5 g/dL (ref 30.0–36.0)
MCV: 89.3 fl (ref 78.0–100.0)
Monocytes Absolute: 0.6 10*3/uL (ref 0.1–1.0)
Neutro Abs: 3.6 10*3/uL (ref 1.4–7.7)
RBC: 4.64 Mil/uL (ref 4.22–5.81)
RDW: 13 % (ref 11.5–14.6)

## 2013-07-08 LAB — TSH: TSH: 1.78 u[IU]/mL (ref 0.35–5.50)

## 2013-07-08 LAB — HEPATIC FUNCTION PANEL
ALT: 25 U/L (ref 0–53)
AST: 21 U/L (ref 0–37)
Albumin: 4.4 g/dL (ref 3.5–5.2)
Total Bilirubin: 0.8 mg/dL (ref 0.3–1.2)

## 2013-07-08 NOTE — Progress Notes (Signed)
htn-- no home bps  Lipids- tolerating meds  OSA_ non compliant (never got CPAP)  Reviewed pmh, psh, sochx, meds   patient denies chest pain, shortness of breath, orthopnea. Denies lower extremity edema, abdominal pain, change in appetite, change in bowel movements. Patient denies rashes, musculoskeletal complaints. No other specific complaints in a complete review of systems.   Reviewed vitals  well-developed well-nourished male in no acute distress. HEENT exam atraumatic, normocephalic, neck supple without jugular venous distention. Chest clear to auscultation cardiac exam S1-S2 are regular. Abdominal exam overweight with bowel sounds, soft and nontender. Extremities no edema. Neurologic exam is alert with a normal gait.

## 2013-07-08 NOTE — Assessment & Plan Note (Signed)
Well controlled Check cbc

## 2013-07-08 NOTE — Assessment & Plan Note (Signed)
Check labs today.

## 2013-07-08 NOTE — Assessment & Plan Note (Signed)
Discussed- He will consider CPAP

## 2013-07-08 NOTE — Assessment & Plan Note (Signed)
Needs bmet bp is well controlled

## 2013-07-08 NOTE — Addendum Note (Signed)
Addended by: Alfred Levins D on: 07/08/2013 10:24 AM   Modules accepted: Orders

## 2013-07-11 ENCOUNTER — Other Ambulatory Visit: Payer: Self-pay | Admitting: *Deleted

## 2013-07-11 MED ORDER — FENOFIBRATE 160 MG PO TABS: 160.0000 mg | ORAL_TABLET | Freq: Every day | ORAL | Status: DC

## 2013-07-18 ENCOUNTER — Telehealth: Payer: Self-pay | Admitting: Internal Medicine

## 2013-07-18 NOTE — Telephone Encounter (Signed)
Received call from daughter inquiring about Cholesterol medication.  States that patient was recently started on a new medication and they would like to know if patient is to take this medication in addition to previous medication or if he is supposed to discontinue the other medication for his cholesterol.  Office note: please clarify medication question.

## 2013-07-20 NOTE — Telephone Encounter (Signed)
Per Dr Cato Mulligan result note pt is to add the fenofibrate with his current meds.  Told daughter to take both crestor and fenofibrate.  She verbalized understanding and had no questions

## 2013-07-31 ENCOUNTER — Other Ambulatory Visit: Payer: Self-pay | Admitting: Internal Medicine

## 2013-08-22 ENCOUNTER — Other Ambulatory Visit: Payer: Self-pay

## 2013-08-29 ENCOUNTER — Other Ambulatory Visit (INDEPENDENT_AMBULATORY_CARE_PROVIDER_SITE_OTHER): Payer: BC Managed Care – PPO

## 2013-08-29 DIAGNOSIS — E785 Hyperlipidemia, unspecified: Secondary | ICD-10-CM

## 2013-08-29 LAB — LDL CHOLESTEROL, DIRECT: Direct LDL: 84.7 mg/dL

## 2013-08-29 LAB — LIPID PANEL
Cholesterol: 181 mg/dL (ref 0–200)
HDL: 42.8 mg/dL (ref >39.00)
Total CHOL/HDL Ratio: 4
Triglycerides: 477 mg/dL — ABNORMAL HIGH (ref 0.0–149.0)
VLDL: 95.4 mg/dL — ABNORMAL HIGH (ref 0.0–40.0)

## 2013-08-29 LAB — HEPATIC FUNCTION PANEL
Albumin: 4.5 g/dL (ref 3.5–5.2)
Total Bilirubin: 0.8 mg/dL (ref 0.3–1.2)

## 2013-08-31 ENCOUNTER — Other Ambulatory Visit: Payer: Self-pay | Admitting: Internal Medicine

## 2013-09-19 ENCOUNTER — Other Ambulatory Visit: Payer: Self-pay | Admitting: Internal Medicine

## 2013-10-28 ENCOUNTER — Other Ambulatory Visit: Payer: Self-pay | Admitting: Internal Medicine

## 2013-11-27 ENCOUNTER — Other Ambulatory Visit: Payer: Self-pay | Admitting: Internal Medicine

## 2014-02-12 ENCOUNTER — Other Ambulatory Visit: Payer: Self-pay | Admitting: Internal Medicine

## 2014-02-14 ENCOUNTER — Other Ambulatory Visit: Payer: Self-pay | Admitting: Internal Medicine

## 2014-03-02 ENCOUNTER — Other Ambulatory Visit: Payer: Self-pay | Admitting: Internal Medicine

## 2014-04-18 ENCOUNTER — Other Ambulatory Visit: Payer: Self-pay | Admitting: Internal Medicine

## 2014-04-25 ENCOUNTER — Other Ambulatory Visit: Payer: Self-pay | Admitting: Internal Medicine

## 2014-05-06 ENCOUNTER — Encounter: Payer: Self-pay | Admitting: *Deleted

## 2014-06-04 ENCOUNTER — Other Ambulatory Visit: Payer: Self-pay | Admitting: Internal Medicine

## 2014-06-08 ENCOUNTER — Ambulatory Visit (INDEPENDENT_AMBULATORY_CARE_PROVIDER_SITE_OTHER): Payer: BC Managed Care – PPO | Admitting: Family Medicine

## 2014-06-08 ENCOUNTER — Encounter: Payer: Self-pay | Admitting: Family Medicine

## 2014-06-08 VITALS — BP 118/78 | HR 74 | Temp 98.0°F | Resp 16 | Ht 72.0 in | Wt 250.0 lb

## 2014-06-08 DIAGNOSIS — Z0000 Encounter for general adult medical examination without abnormal findings: Secondary | ICD-10-CM

## 2014-06-08 DIAGNOSIS — F17200 Nicotine dependence, unspecified, uncomplicated: Secondary | ICD-10-CM | POA: Insufficient documentation

## 2014-06-08 DIAGNOSIS — G4730 Sleep apnea, unspecified: Secondary | ICD-10-CM | POA: Insufficient documentation

## 2014-06-08 DIAGNOSIS — Z970 Presence of artificial eye: Secondary | ICD-10-CM | POA: Insufficient documentation

## 2014-06-08 LAB — LIPID PANEL
Cholesterol: 152 mg/dL (ref 0–200)
HDL: 39 mg/dL — AB (ref >39)
LDL CALC: 55 mg/dL (ref 0–99)
TRIGLYCERIDES: 289 mg/dL — AB (ref <150)
Total CHOL/HDL Ratio: 3.9 Ratio
VLDL: 58 mg/dL — AB (ref 0–40)

## 2014-06-08 LAB — COMPLETE METABOLIC PANEL WITH GFR
ALT: 22 U/L (ref 0–53)
AST: 15 U/L (ref 0–37)
Albumin: 4.4 g/dL (ref 3.5–5.2)
Alkaline Phosphatase: 33 U/L — ABNORMAL LOW (ref 39–117)
BILIRUBIN TOTAL: 0.5 mg/dL (ref 0.2–1.2)
BUN: 16 mg/dL (ref 6–23)
CO2: 26 mEq/L (ref 19–32)
Calcium: 9.3 mg/dL (ref 8.4–10.5)
Chloride: 100 mEq/L (ref 96–112)
Creat: 1 mg/dL (ref 0.50–1.35)
GFR, Est Non African American: 80 mL/min
Glucose, Bld: 112 mg/dL — ABNORMAL HIGH (ref 70–99)
Potassium: 4.1 mEq/L (ref 3.5–5.3)
Sodium: 138 mEq/L (ref 135–145)
Total Protein: 7.4 g/dL (ref 6.0–8.3)

## 2014-06-08 LAB — CBC WITH DIFFERENTIAL/PLATELET
BASOS ABS: 0 10*3/uL (ref 0.0–0.1)
BASOS PCT: 0 % (ref 0–1)
EOS ABS: 0.3 10*3/uL (ref 0.0–0.7)
EOS PCT: 4 % (ref 0–5)
HEMATOCRIT: 38.4 % — AB (ref 39.0–52.0)
HEMOGLOBIN: 13.6 g/dL (ref 13.0–17.0)
Lymphocytes Relative: 36 % (ref 12–46)
Lymphs Abs: 2.3 10*3/uL (ref 0.7–4.0)
MCH: 30.2 pg (ref 26.0–34.0)
MCHC: 35.4 g/dL (ref 30.0–36.0)
MCV: 85.3 fL (ref 78.0–100.0)
MONO ABS: 0.6 10*3/uL (ref 0.1–1.0)
MONOS PCT: 10 % (ref 3–12)
Neutro Abs: 3.2 10*3/uL (ref 1.7–7.7)
Neutrophils Relative %: 50 % (ref 43–77)
Platelets: 262 10*3/uL (ref 150–400)
RBC: 4.5 MIL/uL (ref 4.22–5.81)
RDW: 13.8 % (ref 11.5–15.5)
WBC: 6.3 10*3/uL (ref 4.0–10.5)

## 2014-06-08 NOTE — Progress Notes (Signed)
Subjective:    Patient ID: [name], male    DOB: April 08, 1951, 63 y.o.   MRN: [id number]  HPI This is a very pleasant 63 year old African American male here today to establish care. Years ago he had a sleep study. He was diagnosed sleep apnea but he never received the equipment. He is now willing to begin CPAP therapy. He smokes probably one pack of cigarettes per day but he is in the pre-contemplative phase and smoking cessation. He has past medical history of colon polyps. His last colonoscopy was one year ago. He is due every 5 years because of family history as well as his history of colon polyps. He is due in October for a prostate exam. He is also due for his flu shot. Past Medical History  Diagnosis Date  . Hyperlipidemia   . Hypertension   . GERD (gastroesophageal reflux disease)   . Bladder neck obstruction   . Sleep apnea     does not use machine per pt.  . Glaucoma   . Colon polyp   . Smoker   . Prosthetic eye globe     Right eye   Past Surgical History  Procedure Laterality Date  . Elbow surgery    . Fractured radius surgery    . Arthroscopic knee surgery    . Right knee tka    . Total knee arthroplasty    . Traumatic eye enucleation  2011    right   Current Outpatient Prescriptions on File Prior to Visit  Medication Sig Dispense Refill  . amLODipine (NORVASC) 5 MG tablet TAKE 1 TABLET EVERY DAY  90 tablet  0  . CRESTOR 20 MG tablet TAKE 1 TABLET BY MOUTH EVERY DAY  30 tablet  0  . fenofibrate 160 MG tablet TAKE 1 TABLET BY MOUTH DAILY  90 tablet  1  . lisinopril-hydrochlorothiazide (PRINZIDE,ZESTORETIC) 20-25 MG per tablet TAKE 1 TABLET BY MOUTH DAILY.  90 tablet  0  . omeprazole (PRILOSEC) 20 MG capsule Take 20 mg by mouth daily.       No current facility-administered medications on file prior to visit.   No Known Allergies History   Social History  . Marital Status: Divorced    Spouse Name: N/A    Number of Children: N/A  . Years of Education:  N/A   Occupational History  . Not on file.   Social History Main Topics  . Smoking status: Current Every Day Smoker -- 1.00 packs/day    Types: Cigarettes  . Smokeless tobacco: Never Used  . Alcohol Use: 12.0 oz/week    20 Cans of beer per week     Comment: 6 pack of beer daily  . Drug Use: No  . Sexual Activity: Yes   Other Topics Concern  . Not on file   Social History Narrative  . No narrative on file   Family History  Problem Relation Age of Onset  . Colon cancer Brother 77  . Cancer Father     lung (smoker)      Review of Systems  All other systems reviewed and are negative.      Objective:   Physical Exam  Vitals reviewed. Constitutional: He is oriented to person, place, and time. He appears well-developed and well-nourished. No distress.  HENT:  Head: Normocephalic and atraumatic.  Right Ear: External ear normal.  Left Ear: External ear normal.  Nose: Nose normal.  Mouth/Throat: Oropharynx is clear and moist. No oropharyngeal exudate.  Eyes:  Conjunctivae and EOM are normal. Pupils are equal, round, and reactive to light. Right eye exhibits no discharge. Left eye exhibits no discharge. No scleral icterus.  Neck: Normal range of motion. Neck supple. No JVD present. No tracheal deviation present. No thyromegaly present.  Cardiovascular: Normal rate, regular rhythm, normal heart sounds and intact distal pulses.  Exam reveals no gallop and no friction rub.   No murmur heard. Pulmonary/Chest: Effort normal and breath sounds normal. No stridor. No respiratory distress. He has no wheezes. He has no rales. He exhibits no tenderness.  Abdominal: Soft. Bowel sounds are normal. He exhibits no distension and no mass. There is no tenderness. There is no rebound and no guarding.  Musculoskeletal: Normal range of motion. He exhibits no edema and no tenderness.  Lymphadenopathy:    He has no cervical adenopathy.  Neurological: He is alert and oriented to person, place, and  time. He has normal reflexes. He displays normal reflexes. No cranial nerve deficit. He exhibits normal muscle tone. Coordination normal.  Skin: Skin is warm. No rash noted. He is not diaphoretic. No erythema. No pallor.  Psychiatric: He has a normal mood and affect. His behavior is normal. Judgment and thought content normal.          Assessment & Plan:  Routine general medical examination at a health care facility - Plan: COMPLETE METABOLIC PANEL WITH GFR, Lipid panel, CBC with Differential, Ambulatory referral to [place]  I recommend vaccine as well as Pneumovax because of his smoking and the patient declined both. His colonoscopy is up-to-date. I recommended a PSA after October 17. I will check a CBC, CMP, fasting lipid panel. I also consulted [facility] to arrange CPAP supplies. I recommended smoking cessation the patient is in the pre-contemplative phase. Otherwise his immunizations are up to date in her maintenance preventive care is up to date. His blood pressure is excellent. I did recommend therapeutic lifestyle changes to try to achieve a goal rate of approximate 200 pounds.

## 2014-07-11 ENCOUNTER — Other Ambulatory Visit: Payer: Self-pay | Admitting: *Deleted

## 2014-07-11 MED ORDER — LISINOPRIL-HYDROCHLOROTHIAZIDE 20-25 MG PO TABS: ORAL_TABLET | ORAL | Status: DC

## 2014-07-11 NOTE — Telephone Encounter (Signed)
Received fax requesting refill on Lisinopril/HCTZ.   Refill appropriate and filled per protocol. 

## 2014-07-25 ENCOUNTER — Other Ambulatory Visit: Payer: Self-pay | Admitting: Internal Medicine

## 2014-07-26 ENCOUNTER — Other Ambulatory Visit: Payer: Self-pay | Admitting: Family Medicine

## 2014-08-19 ENCOUNTER — Other Ambulatory Visit: Payer: Self-pay | Admitting: Internal Medicine

## 2014-08-21 NOTE — Telephone Encounter (Signed)
Ok to refill x 5 

## 2014-08-22 NOTE — Telephone Encounter (Signed)
Ok x 5 

## 2014-08-23 ENCOUNTER — Other Ambulatory Visit: Payer: Self-pay | Admitting: Family Medicine

## 2014-08-23 MED ORDER — ROSUVASTATIN CALCIUM 20 MG PO TABS: 20.0000 mg | ORAL_TABLET | Freq: Every day | ORAL | Status: DC

## 2014-08-23 NOTE — Telephone Encounter (Signed)
Med sent to pharm 

## 2014-10-13 ENCOUNTER — Other Ambulatory Visit: Payer: Self-pay | Admitting: Family Medicine

## 2014-10-20 ENCOUNTER — Other Ambulatory Visit: Payer: Self-pay | Admitting: Family Medicine

## 2014-12-07 ENCOUNTER — Encounter: Payer: Self-pay | Admitting: Family Medicine

## 2014-12-07 ENCOUNTER — Ambulatory Visit (INDEPENDENT_AMBULATORY_CARE_PROVIDER_SITE_OTHER): Payer: BLUE CROSS/BLUE SHIELD | Admitting: Family Medicine

## 2014-12-07 VITALS — BP 126/82 | HR 80 | Temp 98.3°F | Resp 18 | Ht 72.0 in | Wt 248.0 lb

## 2014-12-07 DIAGNOSIS — E785 Hyperlipidemia, unspecified: Secondary | ICD-10-CM | POA: Diagnosis not present

## 2014-12-07 DIAGNOSIS — I10 Essential (primary) hypertension: Secondary | ICD-10-CM

## 2014-12-07 DIAGNOSIS — Z125 Encounter for screening for malignant neoplasm of prostate: Secondary | ICD-10-CM

## 2014-12-07 LAB — LIPID PANEL
Cholesterol: 161 mg/dL (ref 0–200)
HDL: 42 mg/dL (ref >=40)
LDL CALC: 40 mg/dL (ref 0–99)
TRIGLYCERIDES: 394 mg/dL — AB (ref <150)
Total CHOL/HDL Ratio: 3.8 Ratio
VLDL: 79 mg/dL — ABNORMAL HIGH (ref 0–40)

## 2014-12-07 LAB — COMPLETE METABOLIC PANEL WITH GFR
ALBUMIN: 4.4 g/dL (ref 3.5–5.2)
ALK PHOS: 29 U/L — AB (ref 39–117)
ALT: 22 U/L (ref 0–53)
AST: 18 U/L (ref 0–37)
BILIRUBIN TOTAL: 0.5 mg/dL (ref 0.2–1.2)
BUN: 24 mg/dL — ABNORMAL HIGH (ref 6–23)
CO2: 24 meq/L (ref 19–32)
Calcium: 9.5 mg/dL (ref 8.4–10.5)
Chloride: 102 mEq/L (ref 96–112)
Creat: 1.2 mg/dL (ref 0.50–1.35)
GFR, EST AFRICAN AMERICAN: 74 mL/min
GFR, Est Non African American: 64 mL/min
GLUCOSE: 103 mg/dL — AB (ref 70–99)
Potassium: 3.9 mEq/L (ref 3.5–5.3)
Sodium: 140 mEq/L (ref 135–145)
Total Protein: 7.2 g/dL (ref 6.0–8.3)

## 2014-12-07 NOTE — Progress Notes (Signed)
Subjective:    Patient ID: [name], male    DOB: 08/16/1951, 64 y.o.   MRN: [id number]  HPI Patient is a very pleasant 64 year old African-American gentleman who is here today to follow-up his hypertension and hyperlipidemia and dyslipidemia. I am very proud of the patient. He quit smoking in January. He denies any chest pain shortness of breath or dyspnea on exertion. He denies any myalgias or right upper quadrant pain on the fenofibrate and Crestor. His blood pressure is well controlled today at 126/82. He is complaining of some pain and weakness around his right knee be scheduled to see his orthopedist in one month. He is not exercising regularly. The last time we checked his blood sugar he was a borderline prediabetic. His weight is elevated for his height. He is approximately 48 pounds to heavy. I have recommended exercise and weight loss is much as his knee will allow. He is also due for a PSA to monitor for prostate cancer. Past Medical History  Diagnosis Date  . Hyperlipidemia   . Hypertension   . GERD (gastroesophageal reflux disease)   . Bladder neck obstruction   . Sleep apnea     does not use machine per pt.  . Glaucoma   . Colon polyp   . Smoker   . Prosthetic eye globe     Right eye   Past Surgical History  Procedure Laterality Date  . Elbow surgery    . Fractured radius surgery    . Arthroscopic knee surgery    . Right knee tka    . Total knee arthroplasty    . Traumatic eye enucleation  2011    right   Current Outpatient Prescriptions on File Prior to Visit  Medication Sig Dispense Refill  . amLODipine (NORVASC) 5 MG tablet TAKE 1 TABLET BY MOUTH EVERY DAY 90 tablet 0  . fenofibrate 160 MG tablet TAKE 1 TABLET BY MOUTH DAILY 90 tablet 1  . lisinopril-hydrochlorothiazide (PRINZIDE,ZESTORETIC) 20-25 MG per tablet TAKE 1 TABLET BY MOUTH DAILY. 90 tablet 2  . omeprazole (PRILOSEC) 20 MG capsule Take 20 mg by mouth daily.    . rosuvastatin (CRESTOR) 20 MG  tablet Take 1 tablet (20 mg total) by mouth daily. 90 tablet 1   No current facility-administered medications on file prior to visit.   No Known Allergies History   Social History  . Marital Status: Divorced    Spouse Name: N/A  . Number of Children: N/A  . Years of Education: N/A   Occupational History  . Not on file.   Social History Main Topics  . Smoking status: Former Smoker -- 1.00 packs/day    Types: Cigarettes    Quit date: 09/22/2014  . Smokeless tobacco: Never Used  . Alcohol Use: 12.0 oz/week    20 Cans of beer per week     Comment: 6 pack of beer daily  . Drug Use: No  . Sexual Activity: Yes   Other Topics Concern  . Not on file   Social History Narrative      Review of Systems  All other systems reviewed and are negative.      Objective:   Physical Exam  Cardiovascular: Normal rate, regular rhythm, normal heart sounds and intact distal pulses.   No murmur heard. Pulmonary/Chest: Effort normal and breath sounds normal. No respiratory distress. He has no wheezes. He has no rales.  Abdominal: Soft. Bowel sounds are normal. He exhibits no distension. There is no tenderness.  There is no rebound.  Musculoskeletal: He exhibits no edema.  Vitals reviewed.         Assessment & Plan:  Benign essential HTN  HLD (hyperlipidemia) - Plan: COMPLETE METABOLIC PANEL WITH GFR, Lipid panel  Dyslipidemia - Plan: COMPLETE METABOLIC PANEL WITH GFR  Prostate cancer screening - Plan: PSA  Blood pressures well controlled. I'm very happy the patient quit smoking. I did encourage him to increase his aerobic exercise and try to lose a proximally 40 pounds gradually over the next year. I will check a fasting lipid panel and also monitor his triglycerides. We will also recheck his fasting blood sugar. I believe the patient could lose just 10-15 pounds he can help reverse the prediabetic trim that he is currently on. I will also check a PSA. I am satisfied with his blood  pressure at the present time.

## 2014-12-08 LAB — PSA: PSA: 1.47 ng/mL (ref <=4.00)

## 2015-01-21 ENCOUNTER — Other Ambulatory Visit: Payer: Self-pay | Admitting: Family Medicine

## 2015-01-22 NOTE — Telephone Encounter (Signed)
Refill appropriate and filled per protocol. 

## 2015-02-19 ENCOUNTER — Other Ambulatory Visit: Payer: Self-pay | Admitting: Family Medicine

## 2015-07-27 ENCOUNTER — Other Ambulatory Visit: Payer: Self-pay | Admitting: Family Medicine

## 2015-07-27 ENCOUNTER — Encounter: Payer: Self-pay | Admitting: Family Medicine

## 2015-07-27 NOTE — Telephone Encounter (Signed)
Medication refill for one time only.  Patient needs to be seen.  Letter sent for patient to call and schedule 

## 2015-07-29 ENCOUNTER — Other Ambulatory Visit: Payer: Self-pay | Admitting: Family Medicine

## 2015-07-30 ENCOUNTER — Encounter: Payer: Self-pay | Admitting: *Deleted

## 2015-08-02 ENCOUNTER — Other Ambulatory Visit: Payer: Self-pay | Admitting: *Deleted

## 2015-08-02 MED ORDER — LISINOPRIL-HYDROCHLOROTHIAZIDE 20-25 MG PO TABS: 1.0000 | ORAL_TABLET | Freq: Every day | ORAL | Status: DC

## 2015-08-02 NOTE — Telephone Encounter (Signed)
Received fax requesting refill on Lisinopril/HCTZ.   Refill appropriate and filled per protocol. 

## 2015-08-08 ENCOUNTER — Encounter: Payer: Self-pay | Admitting: Gastroenterology

## 2015-08-15 ENCOUNTER — Encounter: Payer: Self-pay | Admitting: Family Medicine

## 2015-08-15 ENCOUNTER — Ambulatory Visit (INDEPENDENT_AMBULATORY_CARE_PROVIDER_SITE_OTHER): Payer: BLUE CROSS/BLUE SHIELD | Admitting: Family Medicine

## 2015-08-15 VITALS — BP 136/90 | HR 80 | Temp 98.1°F | Resp 18 | Ht 72.0 in | Wt 250.0 lb

## 2015-08-15 DIAGNOSIS — Z23 Encounter for immunization: Secondary | ICD-10-CM | POA: Diagnosis not present

## 2015-08-15 DIAGNOSIS — E785 Hyperlipidemia, unspecified: Secondary | ICD-10-CM | POA: Diagnosis not present

## 2015-08-15 DIAGNOSIS — I10 Essential (primary) hypertension: Secondary | ICD-10-CM | POA: Diagnosis not present

## 2015-08-15 LAB — COMPLETE METABOLIC PANEL WITH GFR
ALBUMIN: 4.1 g/dL (ref 3.6–5.1)
ALT: 23 U/L (ref 9–46)
AST: 17 U/L (ref 10–35)
Alkaline Phosphatase: 32 U/L — ABNORMAL LOW (ref 40–115)
BILIRUBIN TOTAL: 0.5 mg/dL (ref 0.2–1.2)
BUN: 14 mg/dL (ref 7–25)
CALCIUM: 9.1 mg/dL (ref 8.6–10.3)
CO2: 25 mmol/L (ref 20–31)
CREATININE: 0.89 mg/dL (ref 0.70–1.25)
Chloride: 103 mmol/L (ref 98–110)
GFR, Est Non African American: >89 mL/min (ref >=60)
Glucose, Bld: 116 mg/dL — ABNORMAL HIGH (ref 70–99)
Potassium: 3.8 mmol/L (ref 3.5–5.3)
SODIUM: 139 mmol/L (ref 135–146)
Total Protein: 7.2 g/dL (ref 6.1–8.1)

## 2015-08-15 LAB — LIPID PANEL
CHOLESTEROL: 165 mg/dL (ref 125–200)
HDL: 44 mg/dL (ref >=40)
TRIGLYCERIDES: 489 mg/dL — AB (ref <150)
Total CHOL/HDL Ratio: 3.8 Ratio (ref <=5.0)

## 2015-08-15 NOTE — Progress Notes (Signed)
Subjective:    Patient ID: [name], male    DOB: [birth year], 64 y.o.   MRN: [id number]  HPI 12/07/14 Patient is a very pleasant 64 year old African-American gentleman who is here today to follow-up his hypertension and hyperlipidemia and dyslipidemia. I am very proud of the patient. He quit smoking in January. He denies any chest pain shortness of breath or dyspnea on exertion. He denies any myalgias or right upper quadrant pain on the fenofibrate and Crestor. His blood pressure is well controlled today at 126/82. He is complaining of some pain and weakness around his right knee be scheduled to see his orthopedist in one month. He is not exercising regularly. The last time we checked his blood sugar he was a borderline prediabetic. His weight is elevated for his height. He is approximately 48 pounds to heavy. I have recommended exercise and weight loss is much as his knee will allow. He is also due for a PSA to monitor for prostate cancer.  At that time, my plan was: Blood pressures well controlled. I'm very happy the patient quit smoking. I did encourage him to increase his aerobic exercise and try to lose a proximally 40 pounds gradually over the next year. I will check a fasting lipid panel and also monitor his triglycerides. We will also recheck his fasting blood sugar. I believe the patient could lose just 10-15 pounds he can help reverse the prediabetic trend that he is currently on. I will also check a PSA. I am satisfied with his blood pressure at the present time.  08/15/15 He is here for follow up.  He continues to refrain from smoking. I am very proud of him from doing that. He is not exercising. This is primarily due to pain he is experiencing in his right leg. He is currently see an orthopedist who believes he is experiencing sciatica secondary to a lumbar disc herniation. He is in the middle of the workup for this. I would eventually like the patient to be able to exercise 20-30  minutes a day possibly on a rowing machine. I believe this would help lower his triglycerides further. It also would help his weight. He also drinks 4-5 beers a day. I would like him to decrease that as I believe that also contributes to his triglyceride levels. Overall though he is doing well with no concerns aside from the pain in his back and legs. Past Medical History  Diagnosis Date  . Hyperlipidemia   . Hypertension   . GERD (gastroesophageal reflux disease)   . Bladder neck obstruction   . Sleep apnea     does not use machine per pt.  . Glaucoma   . Colon polyp   . Smoker   . Prosthetic eye globe     Right eye   Past Surgical History  Procedure Laterality Date  . Elbow surgery    . Fractured radius surgery    . Arthroscopic knee surgery    . Right knee tka    . Total knee arthroplasty    . Traumatic eye enucleation  2011    right   Current Outpatient Prescriptions on File Prior to Visit  Medication Sig Dispense Refill  . amLODipine (NORVASC) 5 MG tablet TAKE 1 TABLET BY MOUTH EVERY DAY 90 tablet 0  . CRESTOR 20 MG tablet TAKE 1 TABLET BY MOUTH EVERY DAY 90 tablet 1  . fenofibrate 160 MG tablet TAKE 1 TABLET BY MOUTH DAILY 90 tablet 1  .  lisinopril-hydrochlorothiazide (PRINZIDE,ZESTORETIC) 20-25 MG tablet Take 1 tablet by mouth daily. 90 tablet 2  . omeprazole (PRILOSEC) 20 MG capsule Take 20 mg by mouth daily.     No current facility-administered medications on file prior to visit.   No Known Allergies Social History   Social History  . Marital Status: Divorced    Spouse Name: N/A  . Number of Children: N/A  . Years of Education: N/A   Occupational History  . Not on file.   Social History Main Topics  . Smoking status: Former Smoker -- 1.00 packs/day    Types: Cigarettes    Quit date: 09/22/2014  . Smokeless tobacco: Never Used  . Alcohol Use: 12.0 oz/week    20 Cans of beer per week     Comment: 6 pack of beer daily  . Drug Use: No  . Sexual Activity:  Yes   Other Topics Concern  . Not on file   Social History Narrative      Review of Systems  All other systems reviewed and are negative.      Objective:   Physical Exam  Cardiovascular: Normal rate, regular rhythm, normal heart sounds and intact distal pulses.   No murmur heard. Pulmonary/Chest: Effort normal and breath sounds normal. No respiratory distress. He has no wheezes. He has no rales.  Abdominal: Soft. Bowel sounds are normal. He exhibits no distension. There is no tenderness. There is no rebound.  Musculoskeletal: He exhibits no edema.  Vitals reviewed.         Assessment & Plan:  Benign essential HTN  HLD (hyperlipidemia) - Plan: COMPLETE METABOLIC PANEL WITH GFR, Lipid panel  Dyslipidemia  Blood pressure is adequate. I will make no changes in his blood pressure medication. I will check a fasting lipid panel. I recommended 20-30 minutes of aerobic exercise every day as his body will allow and decrease consumption of alcohol to help lower his triglyceride levels further. I offered the patient a flu shot but he politely declined. Also recommended a pneumonia vaccine given his smoking history and he agreed to receive Pneumovax 23

## 2015-08-15 NOTE — Addendum Note (Signed)
Addended by: Shary Decamp B on: 08/15/2015 11:56 AM   Modules accepted: Orders

## 2015-08-20 ENCOUNTER — Encounter: Payer: Self-pay | Admitting: Family Medicine

## 2015-08-25 ENCOUNTER — Other Ambulatory Visit: Payer: Self-pay | Admitting: Family Medicine

## 2015-09-06 ENCOUNTER — Other Ambulatory Visit: Payer: Self-pay | Admitting: Family Medicine

## 2015-09-06 NOTE — Telephone Encounter (Signed)
Refill appropriate and filled per protocol. 

## 2015-10-23 ENCOUNTER — Other Ambulatory Visit: Payer: Self-pay | Admitting: Family Medicine

## 2015-10-23 NOTE — Telephone Encounter (Signed)
Medication refilled per protocol. 

## 2016-01-18 ENCOUNTER — Other Ambulatory Visit: Payer: Self-pay | Admitting: Family Medicine

## 2016-01-18 NOTE — Telephone Encounter (Signed)
Refill appropriate and filled per protocol. 

## 2016-03-01 ENCOUNTER — Other Ambulatory Visit: Payer: Self-pay | Admitting: Family Medicine

## 2016-03-03 ENCOUNTER — Encounter: Payer: Self-pay | Admitting: Family Medicine

## 2016-03-12 ENCOUNTER — Other Ambulatory Visit: Payer: BLUE CROSS/BLUE SHIELD

## 2016-03-12 DIAGNOSIS — Z79899 Other long term (current) drug therapy: Secondary | ICD-10-CM

## 2016-03-12 DIAGNOSIS — I10 Essential (primary) hypertension: Secondary | ICD-10-CM

## 2016-03-12 DIAGNOSIS — E785 Hyperlipidemia, unspecified: Secondary | ICD-10-CM

## 2016-03-12 LAB — CBC WITH DIFFERENTIAL/PLATELET
BASOS PCT: 0 %
Basophils Absolute: 0 cells/uL (ref 0–200)
EOS ABS: 159 {cells}/uL (ref 15–500)
Eosinophils Relative: 3 %
HEMATOCRIT: 40.4 % (ref 38.5–50.0)
HEMOGLOBIN: 13.6 g/dL (ref 13.0–17.0)
LYMPHS ABS: 2438 {cells}/uL (ref 850–3900)
Lymphocytes Relative: 46 %
MCH: 30 pg (ref 27.0–33.0)
MCHC: 33.7 g/dL (ref 32.0–36.0)
MCV: 89.2 fL (ref 80.0–100.0)
MONO ABS: 530 {cells}/uL (ref 200–950)
MPV: 10.3 fL (ref 7.5–12.5)
Monocytes Relative: 10 %
NEUTROS ABS: 2173 {cells}/uL (ref 1500–7800)
Neutrophils Relative %: 41 %
PLATELETS: 220 10*3/uL (ref 140–400)
RBC: 4.53 MIL/uL (ref 4.20–5.80)
RDW: 13.7 % (ref 11.0–15.0)
WBC: 5.3 10*3/uL (ref 3.8–10.8)

## 2016-03-12 LAB — LIPID PANEL
CHOL/HDL RATIO: 3.6 ratio (ref <=5.0)
Cholesterol: 153 mg/dL (ref 125–200)
HDL: 42 mg/dL (ref >=40)
LDL Cholesterol: 40 mg/dL (ref <130)
Triglycerides: 357 mg/dL — ABNORMAL HIGH (ref <150)
VLDL: 71 mg/dL — ABNORMAL HIGH (ref <30)

## 2016-03-12 LAB — COMPLETE METABOLIC PANEL WITH GFR
ALT: 21 U/L (ref 9–46)
AST: 20 U/L (ref 10–35)
Albumin: 4.3 g/dL (ref 3.6–5.1)
Alkaline Phosphatase: 32 U/L — ABNORMAL LOW (ref 40–115)
BUN: 20 mg/dL (ref 7–25)
CHLORIDE: 103 mmol/L (ref 98–110)
CO2: 26 mmol/L (ref 20–31)
Calcium: 9.6 mg/dL (ref 8.6–10.3)
Creat: 1.04 mg/dL (ref 0.70–1.25)
GFR, EST AFRICAN AMERICAN: 87 mL/min (ref >=60)
GFR, EST NON AFRICAN AMERICAN: 76 mL/min (ref >=60)
Glucose, Bld: 117 mg/dL — ABNORMAL HIGH (ref 70–99)
Potassium: 4.2 mmol/L (ref 3.5–5.3)
SODIUM: 142 mmol/L (ref 135–146)
Total Bilirubin: 0.6 mg/dL (ref 0.2–1.2)
Total Protein: 7.3 g/dL (ref 6.1–8.1)

## 2016-03-24 ENCOUNTER — Other Ambulatory Visit: Payer: Self-pay | Admitting: Family Medicine

## 2016-03-24 ENCOUNTER — Ambulatory Visit (INDEPENDENT_AMBULATORY_CARE_PROVIDER_SITE_OTHER): Payer: BLUE CROSS/BLUE SHIELD | Admitting: Family Medicine

## 2016-03-24 ENCOUNTER — Encounter: Payer: Self-pay | Admitting: Family Medicine

## 2016-03-24 VITALS — BP 126/80 | HR 84 | Temp 98.0°F | Resp 18 | Ht 72.0 in | Wt 254.0 lb

## 2016-03-24 DIAGNOSIS — I10 Essential (primary) hypertension: Secondary | ICD-10-CM | POA: Diagnosis not present

## 2016-03-24 DIAGNOSIS — R7303 Prediabetes: Secondary | ICD-10-CM | POA: Insufficient documentation

## 2016-03-24 DIAGNOSIS — Z125 Encounter for screening for malignant neoplasm of prostate: Secondary | ICD-10-CM | POA: Diagnosis not present

## 2016-03-24 DIAGNOSIS — Z1159 Encounter for screening for other viral diseases: Secondary | ICD-10-CM | POA: Diagnosis not present

## 2016-03-24 DIAGNOSIS — E785 Hyperlipidemia, unspecified: Secondary | ICD-10-CM | POA: Diagnosis not present

## 2016-03-24 MED ORDER — PRAVASTATIN SODIUM 40 MG PO TABS: 40.0000 mg | ORAL_TABLET | Freq: Every day | ORAL | Status: DC

## 2016-03-24 MED ORDER — FENOFIBRATE 160 MG PO TABS: 160.0000 mg | ORAL_TABLET | Freq: Every day | ORAL | Status: DC

## 2016-03-24 NOTE — Progress Notes (Signed)
Subjective:    Patient ID: [name], male    DOB: 1951-06-08, 65 y.o.   MRN: [id number]  HPI The patient discontinued Crestor approximately 2 months ago. He is only been on fenofibrate. He denies any myalgias. He recently received epidural steroid injections in his lumbar spine due to sciatica he was experiencing in his right leg. Those symptoms have improved. His only concern is painful erections. The distal tip of his penis will bend dorsally with erections.  He is due for hepatitis C screening. He is also due for PSA. His most recent lab work is listed below: Lab on 03/12/2016  Component Date Value Ref Range Status  . Sodium 03/12/2016 142  135 - 146 mmol/L Final  . Potassium 03/12/2016 4.2  3.5 - 5.3 mmol/L Final  . Chloride 03/12/2016 103  98 - 110 mmol/L Final  . CO2 03/12/2016 26  20 - 31 mmol/L Final  . Glucose, Bld 03/12/2016 117* 70 - 99 mg/dL Final  . BUN 03/12/2016 20  7 - 25 mg/dL Final  . Creat 03/12/2016 1.04  0.70 - 1.25 mg/dL Final   Comment:   For patients > or = 64 years of age: The upper reference limit for Creatinine is approximately 13% higher for people identified as African-American.     . Total Bilirubin 03/12/2016 0.6  0.2 - 1.2 mg/dL Final  . Alkaline Phosphatase 03/12/2016 32* 40 - 115 U/L Final  . AST 03/12/2016 20  10 - 35 U/L Final  . ALT 03/12/2016 21  9 - 46 U/L Final  . Total Protein 03/12/2016 7.3  6.1 - 8.1 g/dL Final  . Albumin 03/12/2016 4.3  3.6 - 5.1 g/dL Final  . Calcium 03/12/2016 9.6  8.6 - 10.3 mg/dL Final  . GFR, Est African American 03/12/2016 87  >=60 mL/min Final  . GFR, Est Non African American 03/12/2016 76  >=60 mL/min Final  . WBC 03/12/2016 5.3  3.8 - 10.8 K/uL Final  . RBC 03/12/2016 4.53  4.20 - 5.80 MIL/uL Final  . Hemoglobin 03/12/2016 13.6  13.0 - 17.0 g/dL Final  . HCT 03/12/2016 40.4  38.5 - 50.0 % Final  . MCV 03/12/2016 89.2  80.0 - 100.0 fL Final  . MCH 03/12/2016 30.0  27.0 - 33.0 pg Final  . MCHC 03/12/2016  33.7  32.0 - 36.0 g/dL Final  . RDW 03/12/2016 13.7  11.0 - 15.0 % Final  . Platelets 03/12/2016 220  140 - 400 K/uL Final  . MPV 03/12/2016 10.3  7.5 - 12.5 fL Final  . Neutro Abs 03/12/2016 2173  1500 - 7800 cells/uL Final  . Lymphs Abs 03/12/2016 2438  850 - 3900 cells/uL Final  . Monocytes Absolute 03/12/2016 530  200 - 950 cells/uL Final  . Eosinophils Absolute 03/12/2016 159  15 - 500 cells/uL Final  . Basophils Absolute 03/12/2016 0  0 - 200 cells/uL Final  . Neutrophils Relative % 03/12/2016 41   Final  . Lymphocytes Relative 03/12/2016 46   Final  . Monocytes Relative 03/12/2016 10   Final  . Eosinophils Relative 03/12/2016 3   Final  . Basophils Relative 03/12/2016 0   Final  . Smear Review 03/12/2016 Criteria for review not met   Final   ** Please note change in unit of measure and reference range(s). **  . Cholesterol 03/12/2016 153  125 - 200 mg/dL Final  . Triglycerides 03/12/2016 357* <150 mg/dL Final  . HDL 03/12/2016 42  >=40 mg/dL Final  .  Total CHOL/HDL Ratio 03/12/2016 3.6  <=5.0 Ratio Final  . VLDL 03/12/2016 71* <30 mg/dL Final  . LDL Cholesterol 03/12/2016 40  <130 mg/dL Final   Comment:   Total Cholesterol/HDL Ratio:CHD Risk                        Coronary Heart Disease Risk Table                                        Men       Women          1/2 Average Risk              3.4        3.3              Average Risk              5.0        4.4           2X Average Risk              9.6        7.1           3X Average Risk             23.4       11.0 Use the calculated Patient Ratio above and the CHD Risk table  to determine the patient's CHD Risk.     Past Medical History  Diagnosis Date  . Hyperlipidemia   . Hypertension   . GERD (gastroesophageal reflux disease)   . Bladder neck obstruction   . Sleep apnea     does not use machine per pt.  . Glaucoma   . Colon polyp   . Smoker   . Prosthetic eye globe     Right eye   Past Surgical History    Procedure Laterality Date  . Elbow surgery    . Fractured radius surgery    . Arthroscopic knee surgery    . Right knee tka    . Total knee arthroplasty    . Traumatic eye enucleation  2011    right   Current Outpatient Prescriptions on File Prior to Visit  Medication Sig Dispense Refill  . amLODipine (NORVASC) 5 MG tablet TAKE 1 TABLET BY MOUTH EVERY DAY 90 tablet 0  . fenofibrate 160 MG tablet TAKE 1 TABLET BY MOUTH DAILY 90 tablet 1  . lisinopril-hydrochlorothiazide (PRINZIDE,ZESTORETIC) 20-25 MG tablet Take 1 tablet by mouth daily. 90 tablet 2  . omeprazole (PRILOSEC) 20 MG capsule Take 20 mg by mouth daily.    . rosuvastatin (CRESTOR) 20 MG tablet TAKE 1 TABLET BY MOUTH EVERY DAY 90 tablet 1   No current facility-administered medications on file prior to visit.   No Known Allergies Social History   Social History  . Marital Status: Divorced    Spouse Name: N/A  . Number of Children: N/A  . Years of Education: N/A   Occupational History  . Not on file.   Social History Main Topics  . Smoking status: Former Smoker -- 1.00 packs/day    Types: Cigarettes    Quit date: 09/22/2014  . Smokeless tobacco: Never Used  . Alcohol Use: 12.0 oz/week    20 Cans of beer per week     Comment: 6 pack of beer daily  .  Drug Use: No  . Sexual Activity: Yes   Other Topics Concern  . Not on file   Social History Narrative      Review of Systems  All other systems reviewed and are negative.      Objective:   Physical Exam  Cardiovascular: Normal rate, regular rhythm, normal heart sounds and intact distal pulses.   No murmur heard. Pulmonary/Chest: Effort normal and breath sounds normal. No respiratory distress. He has no wheezes. He has no rales.  Abdominal: Soft. Bowel sounds are normal. He exhibits no distension. There is no tenderness. There is no rebound.  Musculoskeletal: He exhibits no edema.  Vitals reviewed.         Assessment & Plan:  HLD (hyperlipidemia)  - Plan: CANCELED: COMPLETE METABOLIC PANEL WITH GFR, CANCELED: Lipid panel  Benign essential HTN  Need for hepatitis C screening test - Plan: Hepatitis C Ab Reflex HCV RNA, QUANT  Prostate cancer screening - Plan: PSA Even on fenofibrate, triglycerides are still near 400. I would like to start the patient on pravastatin 40 mg by mouth daily given the fact he discontinued Crestor because of cost and recheck lab work in 6 months. I cautioned the patient about myalgias. I will also check a PSA as well as a hepatitis C screening test to update his preventative care. We discussed his prediabetes. I recommended low carbohydrate diet, decrease consumption of beer, and increasing aerobic exercise and weight loss.

## 2016-03-25 LAB — PSA: PSA: 1.17 ng/mL (ref <=4.00)

## 2016-03-25 LAB — HEPATITIS C ANTIBODY: HCV AB: NEGATIVE

## 2016-05-06 ENCOUNTER — Other Ambulatory Visit: Payer: Self-pay | Admitting: Family Medicine

## 2016-05-06 NOTE — Telephone Encounter (Signed)
Refill appropriate and filled per protocol. 

## 2016-05-07 ENCOUNTER — Other Ambulatory Visit: Payer: Self-pay | Admitting: Family Medicine

## 2016-06-09 ENCOUNTER — Telehealth: Payer: Self-pay | Admitting: Family Medicine

## 2016-06-09 DIAGNOSIS — E785 Hyperlipidemia, unspecified: Secondary | ICD-10-CM

## 2016-06-09 MED ORDER — PRAVASTATIN SODIUM 40 MG PO TABS: 40.0000 mg | ORAL_TABLET | Freq: Every day | ORAL | 3 refills | Status: DC

## 2016-06-09 NOTE — Telephone Encounter (Signed)
Medication called/sent to requested pharmacy and pt aware via vm 

## 2016-06-09 NOTE — Telephone Encounter (Signed)
Patient calling to say that the rosuvastatin has run out and he understood that dr pickard was going to change this anyway, please call him at [phone]  [employer]

## 2016-10-10 ENCOUNTER — Encounter: Payer: Self-pay | Admitting: *Deleted

## 2016-10-24 ENCOUNTER — Other Ambulatory Visit: Payer: Self-pay | Admitting: Family Medicine

## 2016-10-27 ENCOUNTER — Encounter: Payer: Self-pay | Admitting: Internal Medicine

## 2017-01-14 DIAGNOSIS — H401122 Primary open-angle glaucoma, left eye, moderate stage: Secondary | ICD-10-CM | POA: Diagnosis not present

## 2017-02-09 ENCOUNTER — Other Ambulatory Visit: Payer: Self-pay | Admitting: Family Medicine

## 2017-05-10 ENCOUNTER — Other Ambulatory Visit: Payer: Self-pay | Admitting: Family Medicine

## 2017-06-07 ENCOUNTER — Other Ambulatory Visit: Payer: Self-pay | Admitting: Family Medicine

## 2017-06-08 NOTE — Telephone Encounter (Signed)
Medication refill for one time only.  Patient needs to be seen.  Letter sent for patient to call and schedule.  Pt has not been seen in over 1 year. 

## 2017-06-29 ENCOUNTER — Other Ambulatory Visit: Payer: Medicare Other

## 2017-06-29 DIAGNOSIS — Z79899 Other long term (current) drug therapy: Secondary | ICD-10-CM | POA: Diagnosis not present

## 2017-06-29 DIAGNOSIS — E785 Hyperlipidemia, unspecified: Secondary | ICD-10-CM | POA: Diagnosis not present

## 2017-06-29 DIAGNOSIS — I10 Essential (primary) hypertension: Secondary | ICD-10-CM

## 2017-06-29 DIAGNOSIS — R7303 Prediabetes: Secondary | ICD-10-CM | POA: Diagnosis not present

## 2017-06-30 LAB — CBC WITH DIFFERENTIAL/PLATELET
BASOS ABS: 59 {cells}/uL (ref 0–200)
Basophils Relative: 0.9 %
EOS PCT: 4 %
Eosinophils Absolute: 260 cells/uL (ref 15–500)
HCT: 40.2 % (ref 38.5–50.0)
HEMOGLOBIN: 13.9 g/dL (ref 13.2–17.1)
LYMPHS ABS: 2665 {cells}/uL (ref 850–3900)
MCH: 30.2 pg (ref 27.0–33.0)
MCHC: 34.6 g/dL (ref 32.0–36.0)
MCV: 87.2 fL (ref 80.0–100.0)
MPV: 10.9 fL (ref 7.5–12.5)
Monocytes Relative: 9.8 %
NEUTROS PCT: 44.3 %
Neutro Abs: 2880 cells/uL (ref 1500–7800)
PLATELETS: 256 10*3/uL (ref 140–400)
RBC: 4.61 10*6/uL (ref 4.20–5.80)
RDW: 13.1 % (ref 11.0–15.0)
TOTAL LYMPHOCYTE: 41 %
WBC mixed population: 637 cells/uL (ref 200–950)
WBC: 6.5 10*3/uL (ref 3.8–10.8)

## 2017-06-30 LAB — COMPLETE METABOLIC PANEL WITH GFR
AG Ratio: 1.5 (calc) (ref 1.0–2.5)
ALBUMIN MSPROF: 4.4 g/dL (ref 3.6–5.1)
ALT: 36 U/L (ref 9–46)
AST: 22 U/L (ref 10–35)
Alkaline phosphatase (APISO): 31 U/L — ABNORMAL LOW (ref 40–115)
BILIRUBIN TOTAL: 0.6 mg/dL (ref 0.2–1.2)
BUN: 20 mg/dL (ref 7–25)
CALCIUM: 9.5 mg/dL (ref 8.6–10.3)
CHLORIDE: 101 mmol/L (ref 98–110)
CO2: 25 mmol/L (ref 20–32)
CREATININE: 1.09 mg/dL (ref 0.70–1.25)
GFR, EST AFRICAN AMERICAN: 82 mL/min/{1.73_m2} (ref >=60)
GFR, EST NON AFRICAN AMERICAN: 71 mL/min/{1.73_m2} (ref >=60)
GLUCOSE: 105 mg/dL — AB (ref 65–99)
Globulin: 3 g/dL (calc) (ref 1.9–3.7)
Potassium: 3.8 mmol/L (ref 3.5–5.3)
Sodium: 139 mmol/L (ref 135–146)
TOTAL PROTEIN: 7.4 g/dL (ref 6.1–8.1)

## 2017-06-30 LAB — HEMOGLOBIN A1C
EAG (MMOL/L): 7.9 (calc)
Hgb A1c MFr Bld: 6.6 % of total Hgb — ABNORMAL HIGH (ref <5.7)
MEAN PLASMA GLUCOSE: 143 (calc)

## 2017-06-30 LAB — LIPID PANEL
Cholesterol: 225 mg/dL — ABNORMAL HIGH (ref <200)
HDL: 42 mg/dL (ref >40)
NON-HDL CHOLESTEROL (CALC): 183 mg/dL — AB (ref <130)
Total CHOL/HDL Ratio: 5.4 (calc) — ABNORMAL HIGH (ref <5.0)
Triglycerides: 649 mg/dL — ABNORMAL HIGH (ref <150)

## 2017-07-02 ENCOUNTER — Ambulatory Visit (INDEPENDENT_AMBULATORY_CARE_PROVIDER_SITE_OTHER): Payer: Medicare Other | Admitting: Family Medicine

## 2017-07-02 ENCOUNTER — Encounter: Payer: Self-pay | Admitting: Family Medicine

## 2017-07-02 VITALS — BP 130/90 | HR 93 | Temp 97.8°F | Resp 16 | Ht 72.0 in | Wt 265.0 lb

## 2017-07-02 DIAGNOSIS — N486 Induration penis plastica: Secondary | ICD-10-CM | POA: Diagnosis not present

## 2017-07-02 DIAGNOSIS — I10 Essential (primary) hypertension: Secondary | ICD-10-CM

## 2017-07-02 DIAGNOSIS — E782 Mixed hyperlipidemia: Secondary | ICD-10-CM

## 2017-07-02 DIAGNOSIS — E119 Type 2 diabetes mellitus without complications: Secondary | ICD-10-CM | POA: Diagnosis not present

## 2017-07-02 NOTE — Progress Notes (Signed)
Subjective:    Patient ID: [name], male    DOB: 1950-12-30, 66 y.o.   MRN: [id number]  HPIThe patient discontinued Crestor approximately 2 months ago. He is only been on fenofibrate. He denies any myalgias. He recently received epidural steroid injections in his lumbar spine due to sciatica he was experiencing in his right leg. Those symptoms have improved. His only concern is painful erections. The distal tip of his penis will bend dorsally with erections.  He is due for hepatitis C screening. He is also due for PSA.  At that time, my plan was: ) Even on fenofibrate, triglycerides are still near 400. I would like to start the patient on pravastatin 40 mg by mouth daily given the fact he discontinued Crestor because of cost and recheck lab work in 6 months. I cautioned the patient about myalgias. I will also check a PSA as well as a hepatitis C screening test to update his preventative care. We discussed his prediabetes. I recommended low carbohydrate diet, decrease consumption of beer, and increasing aerobic exercise and weight loss.  07/02/17 Patient is here today for follow-up. He has gained 10 pounds since I last saw him. He is not getting any exercise. He is drinking 3 or 4-6 beers a day. He is eating a diet rich in carbohydrates. His hemoglobin A1c has risen to 6.6 indicating type 2 diabetes. His triglycerides have worsened with weight gain. 420 his blood pressure is acceptable at 130/90. He denies any chest pain. He denies any shortness of breath. He does report some mild dyspnea on exertion secondary to deconditioning but he denies any angina. 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 He is now interested in meeting with urologist to discuss options Lab on 06/29/2017  Component Date Value Ref Range Status  . Glucose, Bld 06/29/2017 105* 65 - 99 mg/dL Final   Comment: .            Fasting reference interval . For someone without known diabetes,  a glucose value between 100 and 125 mg/dL is consistent with prediabetes and should be confirmed with a follow-up test. .   . BUN 06/29/2017 20  7 - 25 mg/dL Final  . Creat 06/29/2017 1.09  0.70 - 1.25 mg/dL Final   Comment: For patients >83 years of age, the reference limit for Creatinine is approximately 13% higher for people identified as African-American. .   . GFR, Est Non African American 06/29/2017 71  > OR = 60 mL/min/1.60m2 Final  . GFR, Est African American 06/29/2017 82  > OR = 60 mL/min/1.58m2 Final  . BUN/Creatinine Ratio 94/70/9628 NOT APPLICABLE  6 - 22 (calc) Final  . Sodium 06/29/2017 139  135 - 146 mmol/L Final  . Potassium 06/29/2017 3.8  3.5 - 5.3 mmol/L Final  . Chloride 06/29/2017 101  98 - 110 mmol/L Final  . CO2 06/29/2017 25  20 - 32 mmol/L Final  . Calcium 06/29/2017 9.5  8.6 - 10.3 mg/dL Final  . Total Protein 06/29/2017 7.4  6.1 - 8.1 g/dL Final  . Albumin 06/29/2017 4.4  3.6 - 5.1 g/dL Final  . Globulin 06/29/2017 3.0  1.9 - 3.7 g/dL (calc) Final  . AG Ratio 06/29/2017 1.5  1.0 - 2.5 (calc) Final  . Total Bilirubin 06/29/2017 0.6  0.2 - 1.2 mg/dL Final  . Alkaline phosphatase (APISO) 06/29/2017 31* 40 - 115 U/L Final  . AST 06/29/2017 22  10 - 35  U/L Final  . ALT 06/29/2017 36  9 - 46 U/L Final  . Cholesterol 06/29/2017 225* <200 mg/dL Final  . HDL 06/29/2017 42  >40 mg/dL Final  . Triglycerides 06/29/2017 649* <150 mg/dL Final  . LDL Cholesterol (Calc) 06/29/2017   mg/dL (calc) Final   Comment: . LDL cholesterol not calculated. Triglyceride levels greater than 400 mg/dL invalidate calculated LDL results. . Reference range: <100 . Desirable range <100 mg/dL for primary prevention;   <70 mg/dL for patients with CHD or diabetic patients  with > or = 2 CHD risk factors. Marland Kitchen LDL-C is now calculated using the Martin-Hopkins  calculation, which is a validated novel method providing  better accuracy than the Friedewald equation in the  estimation of  LDL-C.  Cresenciano Genre et al. Annamaria Helling. 6160;737(10): 2061-2068  (http://education.[facility].com/faq/FAQ164)   . Total CHOL/HDL Ratio 06/29/2017 5.4* <5.0 (calc) Final  . Non-HDL Cholesterol (Calc) 06/29/2017 183* <130 mg/dL (calc) Final   Comment: For patients with diabetes plus 1 major ASCVD risk  factor, treating to a non-HDL-C goal of <100 mg/dL  (LDL-C of <70 mg/dL) is considered a therapeutic  option.   . WBC 06/29/2017 6.5  3.8 - 10.8 Thousand/uL Final  . RBC 06/29/2017 4.61  4.20 - 5.80 Million/uL Final  . Hemoglobin 06/29/2017 13.9  13.2 - 17.1 g/dL Final  . HCT 06/29/2017 40.2  38.5 - 50.0 % Final  . MCV 06/29/2017 87.2  80.0 - 100.0 fL Final  . MCH 06/29/2017 30.2  27.0 - 33.0 pg Final  . MCHC 06/29/2017 34.6  32.0 - 36.0 g/dL Final  . RDW 06/29/2017 13.1  11.0 - 15.0 % Final  . Platelets 06/29/2017 256  140 - 400 Thousand/uL Final  . MPV 06/29/2017 10.9  7.5 - 12.5 fL Final  . Neutro Abs 06/29/2017 2880  1,500 - 7,800 cells/uL Final  . Lymphs Abs 06/29/2017 2665  850 - 3,900 cells/uL Final  . WBC mixed population 06/29/2017 637  200 - 950 cells/uL Final  . Eosinophils Absolute 06/29/2017 260  15 - 500 cells/uL Final  . Basophils Absolute 06/29/2017 59  0 - 200 cells/uL Final  . Neutrophils Relative % 06/29/2017 44.3  % Final  . Total Lymphocyte 06/29/2017 41.0  % Final  . Monocytes Relative 06/29/2017 9.8  % Final  . Eosinophils Relative 06/29/2017 4.0  % Final  . Basophils Relative 06/29/2017 0.9  % Final  . Hgb A1c MFr Bld 06/29/2017 6.6* <5.7 % of total Hgb Final   Comment: For someone without known diabetes, a hemoglobin A1c value of 6.5% or greater indicates that they may have  diabetes and this should be confirmed with a follow-up  test. . For someone with known diabetes, a value <7% indicates  that their diabetes is well controlled and a value  greater than or equal to 7% indicates suboptimal  control. A1c targets should be individualized based on  duration  of diabetes, age, comorbid conditions, and  other considerations. . Currently, no consensus exists regarding use of hemoglobin A1c for diagnosis of diabetes for children. .   . Mean Plasma Glucose 06/29/2017 143  (calc) Final  . eAG (mmol/L) 06/29/2017 7.9  (calc) Final    Past Medical History:  Diagnosis Date  . Bladder neck obstruction   . Colon polyp   . GERD (gastroesophageal reflux disease)   . Glaucoma   . Hyperlipidemia   . Hypertension   . Prediabetes   . Prosthetic eye globe    Right eye  .  Sleep apnea    does not use machine per pt.  [place] Fore    Past Surgical History:  Procedure Laterality Date  . arthroscopic knee surgery    . ELBOW SURGERY    . fractured radius surgery    . right knee tka    . TOTAL KNEE ARTHROPLASTY    . traumatic eye enucleation  2011   right   Current Outpatient Prescriptions on File Prior to Visit  Medication Sig Dispense Refill  . amLODipine (NORVASC) 5 MG tablet TAKE 1 TABLET BY MOUTH EVERY DAY 90 tablet 3  . fenofibrate 160 MG tablet TAKE 1 TABLET (160 MG TOTAL) BY MOUTH DAILY. 30 tablet 0  . lisinopril-hydrochlorothiazide (PRINZIDE,ZESTORETIC) 20-25 MG tablet TAKE 1 TABLET EVERY DAY 90 tablet 2  . omeprazole (PRILOSEC) 20 MG capsule Take 20 mg by mouth daily.    . pravastatin (PRAVACHOL) 40 MG tablet Take 1 tablet (40 mg total) by mouth daily. 90 tablet 3   No current facility-administered medications on file prior to visit.    No Known Allergies Social History   Social History  . Marital status: Divorced    Spouse name: N/A  . Number of children: N/A  . Years of education: N/A   Occupational History  . Not on file.   Social History Main Topics  . Smoking status: Current Every Day Smoker    Packs/day: 1.00    Types: Cigarettes  . Smokeless tobacco: Never Used  . Alcohol use 12.0 oz/week    20 Cans of beer per week     Comment: 6 pack of beer daily  . Drug use: No  . Sexual activity: Yes   Other Topics Concern    . Not on file   Social History Narrative  . No narrative on file      Review of Systems  All other systems reviewed and are negative.      Objective:   Physical Exam  Cardiovascular: Normal rate, regular rhythm, normal heart sounds and intact distal pulses.   No murmur heard. Pulmonary/Chest: Effort normal and breath sounds normal. No respiratory distress. He has no wheezes. He has no rales.  Abdominal: Soft. Bowel sounds are normal. He exhibits no distension. There is no tenderness. There is no rebound.  Musculoskeletal: He exhibits no edema.  Vitals reviewed.         Assessment & Plan:  Peyronie disease  Mixed hyperlipidemia  Benign essential HTN  Controlled type 2 diabetes mellitus without complication, without long-term current use of insulin ([place])  Patient's labs are much worse. His blood pressures acceptable. We discussed options including switching back to Crestor. However I recommended 30 pounds of weight loss and at least 10 pounds of weight loss over the next 3 months. Also recommended drastic changes in his diet including complete cessation of alcohol, drastic reduction in carbohydrates. Eating more vegetables. Also recommended 30 minutes of exercise every day. Because of his back pain, right-sided sciatica, and right knee replacement, he is unable to perform most exercise but I recommended water aerobics or stationary bike. We will recheck the patient in 3 months and add medication at that time and he has been unsuccessful with lifestyle changes. I will consult urology

## 2017-07-09 ENCOUNTER — Other Ambulatory Visit: Payer: Self-pay | Admitting: Family Medicine

## 2017-08-12 DIAGNOSIS — H401122 Primary open-angle glaucoma, left eye, moderate stage: Secondary | ICD-10-CM | POA: Diagnosis not present

## 2017-08-18 DIAGNOSIS — N486 Induration penis plastica: Secondary | ICD-10-CM | POA: Diagnosis not present

## 2017-09-06 ENCOUNTER — Other Ambulatory Visit: Payer: Self-pay | Admitting: Family Medicine

## 2017-09-06 DIAGNOSIS — E785 Hyperlipidemia, unspecified: Secondary | ICD-10-CM

## 2017-10-02 ENCOUNTER — Encounter: Payer: Self-pay | Admitting: Family Medicine

## 2017-10-02 ENCOUNTER — Ambulatory Visit (INDEPENDENT_AMBULATORY_CARE_PROVIDER_SITE_OTHER): Payer: Medicare Other | Admitting: Family Medicine

## 2017-10-02 ENCOUNTER — Encounter: Payer: Self-pay | Admitting: Internal Medicine

## 2017-10-02 VITALS — BP 130/66 | HR 86 | Temp 98.0°F | Resp 18 | Ht 72.0 in | Wt 277.0 lb

## 2017-10-02 DIAGNOSIS — I10 Essential (primary) hypertension: Secondary | ICD-10-CM

## 2017-10-02 DIAGNOSIS — Z23 Encounter for immunization: Secondary | ICD-10-CM

## 2017-10-02 DIAGNOSIS — Z1211 Encounter for screening for malignant neoplasm of colon: Secondary | ICD-10-CM

## 2017-10-02 DIAGNOSIS — E7800 Pure hypercholesterolemia, unspecified: Secondary | ICD-10-CM

## 2017-10-02 DIAGNOSIS — R7303 Prediabetes: Secondary | ICD-10-CM | POA: Diagnosis not present

## 2017-10-02 NOTE — Addendum Note (Signed)
Addended by: Shary Decamp B on: 10/02/2017 08:48 AM   Modules accepted: Orders

## 2017-10-02 NOTE — Progress Notes (Signed)
Subjective:    Patient ID: [name], male    DOB: 03/04/1951, 67 y.o.   MRN: [id number]  HPI  Patient has a history of prediabetes. He is here today to recheck his blood sugar. He denies any polyuria, polydipsia, blurry vision. He denies any neuropathy in his feet. Unfortunately since I last saw him, he has gained substantial weight. He admits to being very sedentary and not exercising. He also admits to dietary indiscretions over the holidays. He denies any chest pain shortness of breath or dyspnea on exertion. His blood pressure today is adequate control to 130/66. He denies any myalgias or right upper quadrant pain on his cholesterol medication. He is due today for fasting lab work. His PSA was checked by his urologist in October. He is due for colonoscopy. He is also due for a flu shot which he refuses. He is due for Prevnar 13 which he agrees to  Past Medical History:  Diagnosis Date  . Bladder neck obstruction   . Colon polyp   . Diabetes mellitus without complication ([employer])   . GERD (gastroesophageal reflux disease)   . Glaucoma   . Hyperlipidemia   . Hypertension   . Prediabetes   . Prosthetic eye globe    Right eye  . Sleep apnea    does not use machine per pt.  Paulla Fore    Past Surgical History:  Procedure Laterality Date  . arthroscopic knee surgery    . ELBOW SURGERY    . fractured radius surgery    . right knee tka    . TOTAL KNEE ARTHROPLASTY    . traumatic eye enucleation  2011   right   Current Outpatient Medications on File Prior to Visit  Medication Sig Dispense Refill  . amLODipine (NORVASC) 5 MG tablet TAKE 1 TABLET BY MOUTH EVERY DAY 90 tablet 3  . fenofibrate 160 MG tablet TAKE 1 TABLET BY MOUTH EVERY DAY 90 tablet 1  . lisinopril-hydrochlorothiazide (PRINZIDE,ZESTORETIC) 20-25 MG tablet TAKE 1 TABLET EVERY DAY 90 tablet 2  . omeprazole (PRILOSEC) 20 MG capsule Take 20 mg by mouth daily.    . pravastatin (PRAVACHOL) 40 MG tablet TAKE 1 TABLET (40  MG TOTAL) BY MOUTH DAILY. 90 tablet 0   No current facility-administered medications on file prior to visit.    No Known Allergies Social History   Socioeconomic History  . Marital status: Divorced    Spouse name: Not on file  . Number of children: Not on file  . Years of education: Not on file  . Highest education level: Not on file  Social Needs  . Financial resource strain: Not on file  . Food insecurity - worry: Not on file  . Food insecurity - inability: Not on file  . Transportation needs - medical: Not on file  . Transportation needs - non-medical: Not on file  Occupational History  . Not on file  Tobacco Use  . Smoking status: Current Every Day Smoker    Packs/day: 1.00    Types: Cigarettes  . Smokeless tobacco: Never Used  Substance and Sexual Activity  . Alcohol use: Yes    Alcohol/week: 12.0 oz    Types: 20 Cans of beer per week    Comment: 6 pack of beer daily  . Drug use: No  . Sexual activity: Yes  Other Topics Concern  . Not on file  Social History Narrative  . Not on file      Review of Systems  All other systems reviewed and are negative.      Objective:   Physical Exam  Cardiovascular: Normal rate, regular rhythm, normal heart sounds and intact distal pulses.  No murmur heard. Pulmonary/Chest: Effort normal and breath sounds normal. No respiratory distress. He has no wheezes. He has no rales.  Abdominal: Soft. Bowel sounds are normal. He exhibits no distension. There is no tenderness. There is no rebound.  Musculoskeletal: He exhibits no edema.  Vitals reviewed.         Assessment & Plan:  Benign essential HTN - Plan: COMPLETE METABOLIC PANEL WITH GFR, Lipid panel  Pure hypercholesterolemia - Plan: COMPLETE METABOLIC PANEL WITH GFR, Lipid panel  Prediabetes - Plan: Hemoglobin A1c  Colon cancer screening - Plan: Ambulatory referral to Gastroenterology  The patient's blood pressure is excellent. I'll make no changes in his blood  pressure medication. I will check a fasting lipid panel. His goal LDL cholesterol is less than 100. I will like to see his triglycerides below 200. I will also check a hemoglobin A1c. I'm concerned with the weight gain, his prediabetes may now be overt diabetes. Goal hemoglobin A1c is less than 6.5. Patient received Prevnar 13 today. He refuses a flu shot. I will schedule the patient for colonoscopy.

## 2017-10-03 LAB — LIPID PANEL
CHOL/HDL RATIO: 5.6 (calc) — AB (ref <5.0)
Cholesterol: 214 mg/dL — ABNORMAL HIGH (ref <200)
HDL: 38 mg/dL — ABNORMAL LOW (ref >40)
Non-HDL Cholesterol (Calc): 176 mg/dL (calc) — ABNORMAL HIGH (ref <130)
Triglycerides: 734 mg/dL — ABNORMAL HIGH (ref <150)

## 2017-10-03 LAB — COMPLETE METABOLIC PANEL WITH GFR
AG Ratio: 1.5 (calc) (ref 1.0–2.5)
ALBUMIN MSPROF: 4.3 g/dL (ref 3.6–5.1)
ALT: 21 U/L (ref 9–46)
AST: 15 U/L (ref 10–35)
Alkaline phosphatase (APISO): 31 U/L — ABNORMAL LOW (ref 40–115)
BUN: 19 mg/dL (ref 7–25)
CO2: 27 mmol/L (ref 20–32)
CREATININE: 1.21 mg/dL (ref 0.70–1.25)
Calcium: 9.4 mg/dL (ref 8.6–10.3)
Chloride: 100 mmol/L (ref 98–110)
GFR, EST AFRICAN AMERICAN: 72 mL/min/{1.73_m2} (ref >=60)
GFR, Est Non African American: 62 mL/min/{1.73_m2} (ref >=60)
GLUCOSE: 126 mg/dL — AB (ref 65–99)
Globulin: 2.9 g/dL (calc) (ref 1.9–3.7)
Potassium: 4.2 mmol/L (ref 3.5–5.3)
Sodium: 139 mmol/L (ref 135–146)
TOTAL PROTEIN: 7.2 g/dL (ref 6.1–8.1)
Total Bilirubin: 0.4 mg/dL (ref 0.2–1.2)

## 2017-10-03 LAB — HEMOGLOBIN A1C
Hgb A1c MFr Bld: 6.8 % of total Hgb — ABNORMAL HIGH (ref <5.7)
MEAN PLASMA GLUCOSE: 148 (calc)
eAG (mmol/L): 8.2 (calc)

## 2017-10-07 ENCOUNTER — Other Ambulatory Visit: Payer: Self-pay | Admitting: Family Medicine

## 2017-10-07 MED ORDER — METFORMIN HCL 500 MG PO TABS: 500.0000 mg | ORAL_TABLET | Freq: Two times a day (BID) | ORAL | 3 refills | Status: DC

## 2017-10-07 NOTE — Progress Notes (Signed)
t fo

## 2017-11-10 ENCOUNTER — Other Ambulatory Visit: Payer: Self-pay | Admitting: Family Medicine

## 2017-11-18 ENCOUNTER — Other Ambulatory Visit: Payer: Self-pay

## 2017-11-18 ENCOUNTER — Ambulatory Visit (AMBULATORY_SURGERY_CENTER): Payer: Self-pay | Admitting: *Deleted

## 2017-11-18 VITALS — Ht 72.0 in | Wt 265.0 lb

## 2017-11-18 DIAGNOSIS — Z800 Family history of malignant neoplasm of digestive organs: Secondary | ICD-10-CM

## 2017-11-18 DIAGNOSIS — Z86010 Personal history of colonic polyps: Secondary | ICD-10-CM

## 2017-11-18 MED ORDER — NA SULFATE-K SULFATE-MG SULF 17.5-3.13-1.6 GM/177ML PO SOLN: 1.0000 | Freq: Once | ORAL | 0 refills | Status: AC

## 2017-11-18 NOTE — Progress Notes (Signed)
No egg or soy allergy known to patient  No issues with past sedation with any surgeries  or procedures, no intubation problems  No diet pills per patient No home 02 use per patient  No blood thinners per patient  Pt denies issues with constipation  No A fib or A flutter  EMMI video sent to pt's e mail pt declined   

## 2017-12-02 ENCOUNTER — Ambulatory Visit (AMBULATORY_SURGERY_CENTER): Payer: Medicare Other | Admitting: Internal Medicine

## 2017-12-02 ENCOUNTER — Encounter: Payer: Self-pay | Admitting: Internal Medicine

## 2017-12-02 ENCOUNTER — Other Ambulatory Visit: Payer: Self-pay

## 2017-12-02 VITALS — BP 121/86 | HR 80 | Temp 98.9°F | Resp 23 | Ht 72.0 in | Wt 277.0 lb

## 2017-12-02 DIAGNOSIS — D124 Benign neoplasm of descending colon: Secondary | ICD-10-CM

## 2017-12-02 DIAGNOSIS — D120 Benign neoplasm of cecum: Secondary | ICD-10-CM | POA: Diagnosis not present

## 2017-12-02 DIAGNOSIS — D123 Benign neoplasm of transverse colon: Secondary | ICD-10-CM | POA: Diagnosis not present

## 2017-12-02 DIAGNOSIS — G4733 Obstructive sleep apnea (adult) (pediatric): Secondary | ICD-10-CM | POA: Diagnosis not present

## 2017-12-02 DIAGNOSIS — D122 Benign neoplasm of ascending colon: Secondary | ICD-10-CM

## 2017-12-02 DIAGNOSIS — D125 Benign neoplasm of sigmoid colon: Secondary | ICD-10-CM | POA: Diagnosis not present

## 2017-12-02 DIAGNOSIS — Z86010 Personal history of colonic polyps: Secondary | ICD-10-CM

## 2017-12-02 DIAGNOSIS — Z1211 Encounter for screening for malignant neoplasm of colon: Secondary | ICD-10-CM | POA: Diagnosis not present

## 2017-12-02 DIAGNOSIS — I10 Essential (primary) hypertension: Secondary | ICD-10-CM | POA: Diagnosis not present

## 2017-12-02 MED ORDER — SODIUM CHLORIDE 0.9 % IV SOLN: 500.0000 mL | Freq: Once | INTRAVENOUS | Status: DC

## 2017-12-02 NOTE — Progress Notes (Signed)
Report given to PACU, vss 

## 2017-12-02 NOTE — Progress Notes (Signed)
Called to room to assist during endoscopic procedure.  Patient ID and intended procedure confirmed with present staff. Received instructions for my participation in the procedure from the performing physician.  

## 2017-12-02 NOTE — Patient Instructions (Signed)
Handouts given on polyps and diverticulosis  YOU HAD AN ENDOSCOPIC PROCEDURE TODAY: Refer to the procedure report and other information in the discharge instructions given to you for any specific questions about what was found during the examination. If this information does not answer your questions, please call [facility] office at [phone] to clarify.   YOU SHOULD EXPECT: Some feelings of bloating in the abdomen. Passage of more gas than usual. Walking can help get rid of the air that was put into your GI tract during the procedure and reduce the bloating. If you had a lower endoscopy (such as a colonoscopy or flexible sigmoidoscopy) you may notice spotting of blood in your stool or on the toilet paper. Some abdominal soreness may be present for a day or two, also.  DIET: Your first meal following the procedure should be a light meal and then it is ok to progress to your normal diet. A half-sandwich or bowl of soup is an example of a good first meal. Heavy or fried foods are harder to digest and may make you feel nauseous or bloated. Drink plenty of fluids but you should avoid alcoholic beverages for 24 hours. If you had a esophageal dilation, please see attached instructions for diet.    ACTIVITY: Your care partner should take you home directly after the procedure. You should plan to take it easy, moving slowly for the rest of the day. You can resume normal activity the day after the procedure however YOU SHOULD NOT DRIVE, use power tools, machinery or perform tasks that involve climbing or major physical exertion for 24 hours (because of the sedation medicines used during the test).   SYMPTOMS TO REPORT IMMEDIATELY: A gastroenterologist can be reached at any hour. Please call [phone]  for any of the following symptoms:  Following lower endoscopy (colonoscopy, flexible sigmoidoscopy) Excessive amounts of blood in the stool  Significant tenderness, worsening of abdominal pains  Swelling of  the abdomen that is new, acute  Fever of 100 or higher    FOLLOW UP:  If any biopsies were taken you will be contacted by phone or by letter within the next 1-3 weeks. Call [phone]  if you have not heard about the biopsies in 3 weeks.  Please also call with any specific questions about appointments or follow up tests.  

## 2017-12-02 NOTE — Op Note (Signed)
[facility] Patient Name: [name] Procedure Date: 12/02/2017 9:09 AM MRN: [id number] Endoscopist: Jerene Bears , MD Age: 67 Referring MD:  Date of Birth: 12/03/1950 Gender: Male Account #: [id number] Procedure:                Colonoscopy Indications:              Surveillance: Personal history of adenomatous                            polyps on last colonoscopy 5 years ago Medicines:                Monitored Anesthesia Care Procedure:                Pre-Anesthesia Assessment:                           - Prior to the procedure, a History and Physical                            was performed, and patient medications and                            allergies were reviewed. The patient's tolerance of                            previous anesthesia was also reviewed. The risks                            and benefits of the procedure and the sedation                            options and risks were discussed with the patient.                            All questions were answered, and informed consent                            was obtained. Prior Anticoagulants: The patient has                            taken no previous anticoagulant or antiplatelet                            agents. ASA Grade Assessment: III - A patient with                            severe systemic disease. After reviewing the risks                            and benefits, the patient was deemed in                            satisfactory condition to undergo the procedure.  After obtaining informed consent, the colonoscope                            was passed under direct vision. Throughout the                            procedure, the patient's blood pressure, pulse, and                            oxygen saturations were monitored continuously. The                            Model PCF-H190DL [id number]) scope was introduced                            through the anus and  advanced to the the cecum,                            identified by appendiceal orifice and ileocecal                            valve. The colonoscopy was performed without                            difficulty. The patient tolerated the procedure                            well. The quality of the bowel preparation was                            good. The ileocecal valve, appendiceal orifice, and                            rectum were photographed. Scope In: 9:17:02 AM Scope Out: 9:44:57 AM Scope Withdrawal Time: 0 hours 24 minutes 16 seconds  Total Procedure Duration: 0 hours 27 minutes 55 seconds  Findings:                 The digital rectal exam was normal.                           A 5 mm polyp was found in the cecum. The polyp was                            sessile. The polyp was removed with a cold snare.                            Resection and retrieval were complete.                           Two sessile polyps were found in the ascending                            colon. The polyps were 4 to 6  mm in size. These                            polyps were removed with a cold snare. Resection                            and retrieval were complete.                           Ten sessile polyps were found in the transverse                            colon. The polyps were 3 to 7 mm in size. These                            polyps were removed with a cold snare. Resection                            and retrieval were complete.                           Two sessile polyps were found in the sigmoid colon                            and descending colon. The polyps were 4 to 5 mm in                            size. These polyps were removed with a cold snare.                            Resection and retrieval were complete.                           Multiple small and large-mouthed diverticula were                            found in the descending colon, transverse colon and                             ascending colon.                           Internal hemorrhoids were found during                            retroflexion. The hemorrhoids were small. Complications:            No immediate complications. Estimated Blood Loss:     Estimated blood loss was minimal. Impression:               - One 5 mm polyp in the cecum, removed with a cold                            snare. Resected and retrieved.                           -  Two 4 to 6 mm polyps in the ascending colon,                            removed with a cold snare. Resected and retrieved.                           - Ten 3 to 7 mm polyps in the transverse colon,                            removed with a cold snare. Resected and retrieved.                           - Two 4 to 5 mm polyps in the sigmoid colon and in                            the descending colon, removed with a cold snare.                            Resected and retrieved.                           - Moderate diverticulosis in the descending colon,                            in the transverse colon and in the ascending colon.                           - Internal hemorrhoids. Recommendation:           - Patient has a contact number available for                            emergencies. The signs and symptoms of potential                            delayed complications were discussed with the                            patient. Return to normal activities tomorrow.                            Written discharge instructions were provided to the                            patient.                           - Resume previous diet.                           - Continue present medications.                           - Await pathology results.                           -  Repeat colonoscopy is recommended for                            surveillance. The colonoscopy date will be                            determined after pathology results from today's                             exam become available for review. Jerene Bears, MD 12/02/2017 9:48:01 AM This report has been signed electronically.

## 2017-12-03 ENCOUNTER — Telehealth: Payer: Self-pay

## 2017-12-03 NOTE — Telephone Encounter (Signed)
  Follow up Call-  Call back number 12/02/2017  Post procedure Call Back phone  # [phone] cell  Permission to leave phone message Yes  Some recent data might be hidden     Patient questions:  Do you have a fever, pain , or abdominal swelling? No. Pain Score  0 *  Have you tolerated food without any problems? Yes.    Have you been able to return to your normal activities? Yes.    Do you have any questions about your discharge instructions: Diet   No. Medications  No. Follow up visit  No.  Do you have questions or concerns about your Care? No.  Actions: * If pain score is 4 or above: No action needed, pain <4.

## 2017-12-09 ENCOUNTER — Encounter: Payer: Self-pay | Admitting: Internal Medicine

## 2017-12-09 ENCOUNTER — Other Ambulatory Visit: Payer: Self-pay | Admitting: Family Medicine

## 2017-12-09 DIAGNOSIS — E785 Hyperlipidemia, unspecified: Secondary | ICD-10-CM

## 2017-12-15 DIAGNOSIS — N486 Induration penis plastica: Secondary | ICD-10-CM | POA: Diagnosis not present

## 2018-01-05 ENCOUNTER — Other Ambulatory Visit: Payer: Self-pay | Admitting: Family Medicine

## 2018-02-09 DIAGNOSIS — H401122 Primary open-angle glaucoma, left eye, moderate stage: Secondary | ICD-10-CM | POA: Diagnosis not present

## 2018-05-14 ENCOUNTER — Other Ambulatory Visit: Payer: Self-pay | Admitting: Family Medicine

## 2018-05-16 ENCOUNTER — Other Ambulatory Visit: Payer: Self-pay | Admitting: Family Medicine

## 2018-06-10 ENCOUNTER — Other Ambulatory Visit: Payer: Self-pay | Admitting: Family Medicine

## 2018-06-10 DIAGNOSIS — E785 Hyperlipidemia, unspecified: Secondary | ICD-10-CM

## 2018-06-23 DIAGNOSIS — M5416 Radiculopathy, lumbar region: Secondary | ICD-10-CM | POA: Diagnosis not present

## 2018-06-30 DIAGNOSIS — M545 Low back pain: Secondary | ICD-10-CM | POA: Diagnosis not present

## 2018-06-30 DIAGNOSIS — M5416 Radiculopathy, lumbar region: Secondary | ICD-10-CM | POA: Diagnosis not present

## 2018-07-01 ENCOUNTER — Other Ambulatory Visit: Payer: Self-pay | Admitting: Neurosurgery

## 2018-07-01 DIAGNOSIS — M5416 Radiculopathy, lumbar region: Secondary | ICD-10-CM | POA: Diagnosis not present

## 2018-07-01 DIAGNOSIS — M4316 Spondylolisthesis, lumbar region: Secondary | ICD-10-CM | POA: Diagnosis not present

## 2018-07-07 ENCOUNTER — Other Ambulatory Visit: Payer: Self-pay | Admitting: Family Medicine

## 2018-07-16 NOTE — Pre-Procedure Instructions (Signed)
[name]  07/16/2018      [id number] [name],  [phone] [facility] [address] [address] Phone: [phone] Fax: [phone]    Your procedure is scheduled on Mon. Nov. 4, 2019 from 8:00AM-10:56AM  Report to [facility] Admitting Entrance "A" at 6:00AM  Call this number if you have problems the morning of surgery:  [phone]   Remember:  Do not eat or drink after midnight on Nov. 3rd    Take these medicines the morning of surgery with A SIP OF WATER: AmLODipine (NORVASC) and Omeprazole (PRILOSEC)  Do not take MetFORMIN (GLUCOPHAGE) the morning of surgery.  As of today, stop taking all Other Aspirin Products, Vitamins, Fish oils, and Herbal medications. Also stop all NSAIDS i.e. Advil, Ibuprofen, Motrin, Aleve, Anaprox, Naproxen, BC, Goody Powders, and all Supplements.  How to Manage Your Diabetes Before and After Surgery  Why is it important to control my blood sugar before and after surgery? . Improving blood sugar levels before and after surgery helps healing and can limit problems. . A way of improving blood sugar control is eating a healthy diet by: o  Eating less sugar and carbohydrates o  Increasing activity/exercise o  Talking with your doctor about reaching your blood sugar goals . High blood sugars (greater than 180 mg/dL) can raise your risk of infections and slow your recovery, so you will need to focus on controlling your diabetes during the weeks before surgery. . Make sure that the doctor who takes care of your diabetes knows about your planned surgery including the date and location.  How do I manage my blood sugar before surgery? . Check your blood sugar at least 4 times a day, starting 2 days before surgery, to make sure that the level is not too high or low. o Check your blood sugar the morning of your surgery when you wake up and every 2 hours until you get to the  Short Stay unit. . If your blood sugar is less than 70 mg/dL, you will need to treat for low blood sugar: o Do not take insulin. o Treat a low blood sugar (less than 70 mg/dL) with  cup of clear juice (cranberry or apple), 4 glucose tablets, OR glucose gel. Recheck blood sugar in 15 minutes after treatment (to make sure it is greater than 70 mg/dL). If your blood sugar is not greater than 70 mg/dL on recheck, call [phone] o  for further instructions. . If your CBG is greater than 220 mg/dL, call the number above for further instructions.  . If you are admitted to the hospital after surgery: o Your blood sugar will be checked by the staff and you will probably be given insulin after surgery (instead of oral diabetes medicines) to make sure you have good blood sugar levels. o The goal for blood sugar control after surgery is 80-180 mg/dL.  Reviewed and Endorsed by [facility] Patient Education Committee, August 2015    Do not wear jewelry.  Do not wear lotions, powders, colognes, or deodorant.  Do not shave 48 hours prior to surgery.  Men may shave face.  Do not bring valuables to the hospital.  [facility] is not responsible for any belongings or valuables.  Contacts, dentures or bridgework may not be worn into surgery.  Leave your suitcase in the car.  After surgery it may be brought to your room.  For patients  admitted to the hospital, discharge time will be determined by your treatment team.  Patients discharged the day of surgery will not be allowed to drive home.   Special instructions:  [facility]- Preparing For Surgery  Before surgery, you can play an important role. Because skin is not sterile, your skin needs to be as free of germs as possible. You can reduce the number of germs on your skin by washing with CHG (chlorahexidine gluconate) Soap before surgery.  CHG is an antiseptic cleaner which kills germs and bonds with the skin to continue killing germs even after  washing.    Oral Hygiene is also important to reduce your risk of infection.  Remember - BRUSH YOUR TEETH THE MORNING OF SURGERY WITH YOUR REGULAR TOOTHPASTE  Please do not use if you have an allergy to CHG or antibacterial soaps. If your skin becomes reddened/irritated stop using the CHG.  Do not shave (including legs and underarms) for at least 48 hours prior to first CHG shower. It is OK to shave your face.  Please follow these instructions carefully.   1. Shower the NIGHT BEFORE SURGERY and the MORNING OF SURGERY with CHG.   2. If you chose to wash your hair, wash your hair first as usual with your normal shampoo.  3. After you shampoo, rinse your hair and body thoroughly to remove the shampoo.  4. Use CHG as you would any other liquid soap. You can apply CHG directly to the skin and wash gently with a scrungie or a clean washcloth.   5. Apply the CHG Soap to your body ONLY FROM THE NECK DOWN.  Do not use on open wounds or open sores. Avoid contact with your eyes, ears, mouth and genitals (private parts). Wash Face and genitals (private parts)  with your normal soap.  6. Wash thoroughly, paying special attention to the area where your surgery will be performed.  7. Thoroughly rinse your body with warm water from the neck down.  8. DO NOT shower/wash with your normal soap after using and rinsing off the CHG Soap.  9. Pat yourself dry with a CLEAN TOWEL.  10. Wear CLEAN PAJAMAS to bed the night before surgery, wear comfortable clothes the morning of surgery  11. Place CLEAN SHEETS on your bed the night of your first shower and DO NOT SLEEP WITH PETS.  Day of Surgery:  Do not apply any deodorants/lotions.  Please wear clean clothes to the hospital/surgery center.   Remember to brush your teeth WITH YOUR REGULAR TOOTHPASTE.  Please read over the following fact sheets that you were given. Pain Booklet, Coughing and Deep Breathing, MRSA Information and Surgical Site Infection  Prevention

## 2018-07-19 ENCOUNTER — Other Ambulatory Visit: Payer: Self-pay

## 2018-07-19 ENCOUNTER — Encounter (HOSPITAL_COMMUNITY): Payer: Self-pay

## 2018-07-19 ENCOUNTER — Encounter (HOSPITAL_COMMUNITY)
Admission: RE | Admit: 2018-07-19 | Discharge: 2018-07-19 | Disposition: A | Discharge status: Home / Self Care | Payer: Medicare Other | Source: Ambulatory Visit | Attending: Neurosurgery | Admitting: Neurosurgery

## 2018-07-19 DIAGNOSIS — Z01818 Encounter for other preprocedural examination: Secondary | ICD-10-CM | POA: Diagnosis not present

## 2018-07-19 DIAGNOSIS — E119 Type 2 diabetes mellitus without complications: Secondary | ICD-10-CM | POA: Insufficient documentation

## 2018-07-19 DIAGNOSIS — I10 Essential (primary) hypertension: Secondary | ICD-10-CM | POA: Diagnosis not present

## 2018-07-19 LAB — ABO/RH: ABO/RH(D): B NEG

## 2018-07-19 LAB — BASIC METABOLIC PANEL
ANION GAP: 11 (ref 5–15)
BUN: 20 mg/dL (ref 8–23)
CHLORIDE: 102 mmol/L (ref 98–111)
CO2: 25 mmol/L (ref 22–32)
CREATININE: 1.12 mg/dL (ref 0.61–1.24)
Calcium: 9.8 mg/dL (ref 8.9–10.3)
GFR calc non Af Amer: >60 mL/min (ref >60)
Glucose, Bld: 112 mg/dL — ABNORMAL HIGH (ref 70–99)
Potassium: 3.8 mmol/L (ref 3.5–5.1)
Sodium: 138 mmol/L (ref 135–145)

## 2018-07-19 LAB — CBC WITH DIFFERENTIAL/PLATELET
Abs Immature Granulocytes: 0.04 10*3/uL (ref 0.00–0.07)
BASOS ABS: 0 10*3/uL (ref 0.0–0.1)
Basophils Relative: 1 %
Eosinophils Absolute: 0.2 10*3/uL (ref 0.0–0.5)
Eosinophils Relative: 3 %
HEMATOCRIT: 41.6 % (ref 39.0–52.0)
Hemoglobin: 13.9 g/dL (ref 13.0–17.0)
Immature Granulocytes: 1 %
LYMPHS ABS: 2.5 10*3/uL (ref 0.7–4.0)
Lymphocytes Relative: 42 %
MCH: 30.7 pg (ref 26.0–34.0)
MCHC: 33.4 g/dL (ref 30.0–36.0)
MCV: 91.8 fL (ref 80.0–100.0)
MONOS PCT: 9 %
Monocytes Absolute: 0.5 10*3/uL (ref 0.1–1.0)
NEUTROS PCT: 44 %
NRBC: 0 % (ref 0.0–0.2)
Neutro Abs: 2.7 10*3/uL (ref 1.7–7.7)
Platelets: 258 10*3/uL (ref 150–400)
RBC: 4.53 MIL/uL (ref 4.22–5.81)
RDW: 12.2 % (ref 11.5–15.5)
WBC: 5.9 10*3/uL (ref 4.0–10.5)

## 2018-07-19 LAB — TYPE AND SCREEN
ABO/RH(D): B NEG
ANTIBODY SCREEN: NEGATIVE

## 2018-07-19 LAB — GLUCOSE, CAPILLARY: Glucose-Capillary: 105 mg/dL — ABNORMAL HIGH (ref 70–99)

## 2018-07-19 LAB — HEMOGLOBIN A1C
HEMOGLOBIN A1C: 6.5 % — AB (ref 4.8–5.6)
Mean Plasma Glucose: 139.85 mg/dL

## 2018-07-19 LAB — SURGICAL PCR SCREEN
MRSA, PCR: NEGATIVE
STAPHYLOCOCCUS AUREUS: NEGATIVE

## 2018-07-19 NOTE — Progress Notes (Signed)
PCP - Dr. Jenna Luo Cardiologist - denies seeing cardiology  Chest x-ray - N/A EKG - 07/19/18 Stress Test - denies ECHO - denies Cardiac Cath - denies  Sleep Study - patient had sleep study 02/19/10. Has CPAP and does not use.   Patient states he does not check CBG's at home.    Aspirin Instructions: Patient instructed to discontinue any ASA/NSAIDs 7 days prior to surgery.  Anesthesia review:   Patient denies shortness of breath, fever, cough and chest pain at PAT appointment   Patient verbalized understanding of instructions that were given to them at the PAT appointment. Patient was also instructed that they will need to review over the PAT instructions again at home before surgery.

## 2018-07-25 NOTE — Anesthesia Preprocedure Evaluation (Addendum)
Anesthesia Evaluation  Patient identified by MRN, date of birth, ID band Patient awake    Reviewed: Allergy & Precautions, NPO status , Patient's Chart, lab work & pertinent test results  Airway Mallampati: II  TM Distance: >3 FB Neck ROM: Full    Dental  (+) Dental Advisory Given, Teeth Intact   Pulmonary sleep apnea , Current Smoker,    Pulmonary exam normal breath sounds clear to auscultation       Cardiovascular hypertension, Pt. on medications Normal cardiovascular exam Rhythm:Regular Rate:Normal     Neuro/Psych negative neurological ROS  negative psych ROS   GI/Hepatic Neg liver ROS, GERD  ,  Endo/Other  negative endocrine ROSdiabetes  Renal/GU negative Renal ROS     Musculoskeletal negative musculoskeletal ROS (+)   Abdominal (+) + obese,   Peds  Hematology negative hematology ROS (+)   Anesthesia Other Findings   Reproductive/Obstetrics                            Anesthesia Physical Anesthesia Plan  ASA: II  Anesthesia Plan: General   Post-op Pain Management:    Induction: Intravenous  PONV Risk Score and Plan: 2 and Ondansetron, Dexamethasone and Treatment may vary due to age or medical condition  Airway Management Planned: Oral ETT  Additional Equipment:   Intra-op Plan:   Post-operative Plan: Extubation in OR  Informed Consent: I have reviewed the patients History and Physical, chart, labs and discussed the procedure including the risks, benefits and alternatives for the proposed anesthesia with the patient or authorized representative who has indicated his/her understanding and acceptance.   Dental advisory given  Plan Discussed with: CRNA  Anesthesia Plan Comments:         Anesthesia Quick Evaluation

## 2018-07-26 ENCOUNTER — Encounter (HOSPITAL_COMMUNITY): Payer: Self-pay | Admitting: General Practice

## 2018-07-26 ENCOUNTER — Inpatient Hospital Stay (HOSPITAL_COMMUNITY): Payer: Medicare Other

## 2018-07-26 ENCOUNTER — Inpatient Hospital Stay (HOSPITAL_COMMUNITY): Payer: Medicare Other | Admitting: Anesthesiology

## 2018-07-26 ENCOUNTER — Inpatient Hospital Stay (HOSPITAL_COMMUNITY)
Admission: RE | Admit: 2018-07-26 | Discharge: 2018-07-27 | DRG: 455 | Disposition: A | Discharge status: Home / Self Care | Payer: Medicare Other | Attending: Neurosurgery | Admitting: Neurosurgery

## 2018-07-26 ENCOUNTER — Other Ambulatory Visit: Payer: Self-pay

## 2018-07-26 ENCOUNTER — Inpatient Hospital Stay (HOSPITAL_COMMUNITY)
Admission: RE | Disposition: A | Discharge status: Home / Self Care | Payer: Self-pay | Source: Home / Self Care | Attending: Neurosurgery

## 2018-07-26 DIAGNOSIS — Z8719 Personal history of other diseases of the digestive system: Secondary | ICD-10-CM

## 2018-07-26 DIAGNOSIS — Z800 Family history of malignant neoplasm of digestive organs: Secondary | ICD-10-CM

## 2018-07-26 DIAGNOSIS — M4316 Spondylolisthesis, lumbar region: Principal | ICD-10-CM

## 2018-07-26 DIAGNOSIS — I10 Essential (primary) hypertension: Secondary | ICD-10-CM | POA: Diagnosis present

## 2018-07-26 DIAGNOSIS — Z7984 Long term (current) use of oral hypoglycemic drugs: Secondary | ICD-10-CM

## 2018-07-26 DIAGNOSIS — F17210 Nicotine dependence, cigarettes, uncomplicated: Secondary | ICD-10-CM | POA: Diagnosis present

## 2018-07-26 DIAGNOSIS — M48061 Spinal stenosis, lumbar region without neurogenic claudication: Secondary | ICD-10-CM | POA: Diagnosis present

## 2018-07-26 DIAGNOSIS — M5116 Intervertebral disc disorders with radiculopathy, lumbar region: Secondary | ICD-10-CM | POA: Diagnosis present

## 2018-07-26 DIAGNOSIS — E785 Hyperlipidemia, unspecified: Secondary | ICD-10-CM | POA: Diagnosis present

## 2018-07-26 DIAGNOSIS — H409 Unspecified glaucoma: Secondary | ICD-10-CM | POA: Diagnosis present

## 2018-07-26 DIAGNOSIS — Z79899 Other long term (current) drug therapy: Secondary | ICD-10-CM | POA: Diagnosis not present

## 2018-07-26 DIAGNOSIS — Z970 Presence of artificial eye: Secondary | ICD-10-CM

## 2018-07-26 DIAGNOSIS — Z419 Encounter for procedure for purposes other than remedying health state, unspecified: Secondary | ICD-10-CM

## 2018-07-26 DIAGNOSIS — Z96651 Presence of right artificial knee joint: Secondary | ICD-10-CM | POA: Diagnosis present

## 2018-07-26 DIAGNOSIS — K219 Gastro-esophageal reflux disease without esophagitis: Secondary | ICD-10-CM | POA: Diagnosis present

## 2018-07-26 DIAGNOSIS — E119 Type 2 diabetes mellitus without complications: Secondary | ICD-10-CM | POA: Diagnosis present

## 2018-07-26 DIAGNOSIS — N320 Bladder-neck obstruction: Secondary | ICD-10-CM | POA: Diagnosis present

## 2018-07-26 DIAGNOSIS — M5416 Radiculopathy, lumbar region: Secondary | ICD-10-CM | POA: Diagnosis not present

## 2018-07-26 DIAGNOSIS — Z981 Arthrodesis status: Secondary | ICD-10-CM | POA: Diagnosis not present

## 2018-07-26 LAB — CBC
HCT: 38.5 % — ABNORMAL LOW (ref 39.0–52.0)
Hemoglobin: 12.4 g/dL — ABNORMAL LOW (ref 13.0–17.0)
MCH: 30 pg (ref 26.0–34.0)
MCHC: 32.2 g/dL (ref 30.0–36.0)
MCV: 93.2 fL (ref 80.0–100.0)
PLATELETS: 260 10*3/uL (ref 150–400)
RBC: 4.13 MIL/uL — ABNORMAL LOW (ref 4.22–5.81)
RDW: 12 % (ref 11.5–15.5)
WBC: 11.9 10*3/uL — AB (ref 4.0–10.5)
nRBC: 0.2 % (ref 0.0–0.2)

## 2018-07-26 LAB — GLUCOSE, CAPILLARY
GLUCOSE-CAPILLARY: 138 mg/dL — AB (ref 70–99)
GLUCOSE-CAPILLARY: 171 mg/dL — AB (ref 70–99)
GLUCOSE-CAPILLARY: 209 mg/dL — AB (ref 70–99)
GLUCOSE-CAPILLARY: 145 mg/dL — AB (ref 70–99)
GLUCOSE-CAPILLARY: 155 mg/dL — AB (ref 70–99)

## 2018-07-26 SURGERY — POSTERIOR LUMBAR FUSION 1 LEVEL
Anesthesia: General | Site: Back

## 2018-07-26 MED ORDER — CEFAZOLIN SODIUM-DEXTROSE 1-4 GM/50ML-% IV SOLN
1.0000 g | Freq: Three times a day (TID) | INTRAVENOUS | Status: AC
  Administered 2018-07-26 (×2): 1 g via INTRAVENOUS
  Filled 2018-07-26 (×2): qty 50

## 2018-07-26 MED ORDER — SODIUM CHLORIDE 0.9 % IV SOLN
INTRAVENOUS | Status: DC | PRN
  Administered 2018-07-26: 25 ug/min via INTRAVENOUS

## 2018-07-26 MED ORDER — MIDAZOLAM HCL 2 MG/2ML IJ SOLN
INTRAMUSCULAR | Status: AC
  Filled 2018-07-26: qty 2

## 2018-07-26 MED ORDER — CEFAZOLIN SODIUM-DEXTROSE 2-4 GM/100ML-% IV SOLN
2.0000 g | INTRAVENOUS | Status: AC
  Administered 2018-07-26: 2 g via INTRAVENOUS

## 2018-07-26 MED ORDER — LISINOPRIL 20 MG PO TABS
20.0000 mg | ORAL_TABLET | Freq: Every day | ORAL | Status: DC
  Administered 2018-07-27: 20 mg via ORAL
  Filled 2018-07-26: qty 1

## 2018-07-26 MED ORDER — CHLORHEXIDINE GLUCONATE CLOTH 2 % EX PADS: 6.0000 | MEDICATED_PAD | Freq: Once | CUTANEOUS | Status: DC

## 2018-07-26 MED ORDER — ONDANSETRON HCL 4 MG/2ML IJ SOLN
INTRAMUSCULAR | Status: AC
  Filled 2018-07-26: qty 2

## 2018-07-26 MED ORDER — HYDROMORPHONE HCL 1 MG/ML IJ SOLN
INTRAMUSCULAR | Status: AC
  Filled 2018-07-26: qty 1

## 2018-07-26 MED ORDER — HYDROMORPHONE HCL 1 MG/ML IJ SOLN
1.0000 mg | INTRAMUSCULAR | Status: DC | PRN
  Administered 2018-07-26: 1 mg via INTRAVENOUS
  Filled 2018-07-26: qty 1

## 2018-07-26 MED ORDER — MENTHOL 3 MG MT LOZG: 1.0000 | LOZENGE | OROMUCOSAL | Status: DC | PRN

## 2018-07-26 MED ORDER — SUGAMMADEX SODIUM 200 MG/2ML IV SOLN
INTRAVENOUS | Status: DC | PRN
  Administered 2018-07-26: 300 mg via INTRAVENOUS

## 2018-07-26 MED ORDER — LISINOPRIL-HYDROCHLOROTHIAZIDE 20-25 MG PO TABS: 1.0000 | ORAL_TABLET | Freq: Every day | ORAL | Status: DC

## 2018-07-26 MED ORDER — FENTANYL CITRATE (PF) 250 MCG/5ML IJ SOLN
INTRAMUSCULAR | Status: DC | PRN
  Administered 2018-07-26: 50 ug via INTRAVENOUS
  Administered 2018-07-26: 150 ug via INTRAVENOUS
  Administered 2018-07-26: 50 ug via INTRAVENOUS
  Administered 2018-07-26: 100 ug via INTRAVENOUS

## 2018-07-26 MED ORDER — MEPERIDINE HCL 50 MG/ML IJ SOLN: 6.2500 mg | INTRAMUSCULAR | Status: DC | PRN

## 2018-07-26 MED ORDER — SODIUM CHLORIDE 0.9 % IV SOLN
INTRAVENOUS | Status: DC | PRN
  Administered 2018-07-26: 500 mL

## 2018-07-26 MED ORDER — 0.9 % SODIUM CHLORIDE (POUR BTL) OPTIME
TOPICAL | Status: DC | PRN
  Administered 2018-07-26: 1000 mL

## 2018-07-26 MED ORDER — SODIUM CHLORIDE 0.9% FLUSH: 3.0000 mL | INTRAVENOUS | Status: DC | PRN

## 2018-07-26 MED ORDER — DIAZEPAM 5 MG PO TABS
5.0000 mg | ORAL_TABLET | Freq: Four times a day (QID) | ORAL | Status: DC | PRN
  Administered 2018-07-26: 5 mg via ORAL
  Administered 2018-07-26: 10 mg via ORAL
  Filled 2018-07-26: qty 2
  Filled 2018-07-26: qty 1

## 2018-07-26 MED ORDER — PHENYLEPHRINE 40 MCG/ML (10ML) SYRINGE FOR IV PUSH (FOR BLOOD PRESSURE SUPPORT)
PREFILLED_SYRINGE | INTRAVENOUS | Status: DC | PRN
  Administered 2018-07-26 (×2): 40 ug via INTRAVENOUS
  Administered 2018-07-26: 80 ug via INTRAVENOUS

## 2018-07-26 MED ORDER — HYDROCHLOROTHIAZIDE 25 MG PO TABS
25.0000 mg | ORAL_TABLET | Freq: Every day | ORAL | Status: DC
  Administered 2018-07-27: 25 mg via ORAL
  Filled 2018-07-26: qty 1

## 2018-07-26 MED ORDER — HYDROCODONE-ACETAMINOPHEN 10-325 MG PO TABS
1.0000 | ORAL_TABLET | ORAL | Status: DC | PRN
  Administered 2018-07-26 – 2018-07-27 (×4): 1 via ORAL
  Filled 2018-07-26 (×4): qty 1

## 2018-07-26 MED ORDER — SODIUM CHLORIDE 0.9 % IV SOLN: 250.0000 mL | INTRAVENOUS | Status: DC

## 2018-07-26 MED ORDER — LIDOCAINE 2% (20 MG/ML) 5 ML SYRINGE
INTRAMUSCULAR | Status: DC | PRN
  Administered 2018-07-26: 100 mg via INTRAVENOUS

## 2018-07-26 MED ORDER — FENTANYL CITRATE (PF) 250 MCG/5ML IJ SOLN
INTRAMUSCULAR | Status: AC
  Filled 2018-07-26: qty 10

## 2018-07-26 MED ORDER — THROMBIN (RECOMBINANT) 20000 UNITS EX SOLR
CUTANEOUS | Status: AC
  Filled 2018-07-26: qty 20000

## 2018-07-26 MED ORDER — PHENOL 1.4 % MT LIQD: 1.0000 | OROMUCOSAL | Status: DC | PRN

## 2018-07-26 MED ORDER — BUPIVACAINE HCL (PF) 0.25 % IJ SOLN
INTRAMUSCULAR | Status: AC
  Filled 2018-07-26: qty 30

## 2018-07-26 MED ORDER — ROCURONIUM BROMIDE 50 MG/5ML IV SOSY
PREFILLED_SYRINGE | INTRAVENOUS | Status: AC
  Filled 2018-07-26: qty 15

## 2018-07-26 MED ORDER — OXYCODONE HCL 5 MG PO TABS
10.0000 mg | ORAL_TABLET | ORAL | Status: DC | PRN
  Administered 2018-07-27: 10 mg via ORAL
  Filled 2018-07-26: qty 2

## 2018-07-26 MED ORDER — PROPOFOL 10 MG/ML IV BOLUS
INTRAVENOUS | Status: AC
  Filled 2018-07-26: qty 40

## 2018-07-26 MED ORDER — ONDANSETRON HCL 4 MG PO TABS: 4.0000 mg | ORAL_TABLET | Freq: Four times a day (QID) | ORAL | Status: DC | PRN

## 2018-07-26 MED ORDER — OXYCODONE HCL 5 MG PO TABS: 5.0000 mg | ORAL_TABLET | Freq: Once | ORAL | Status: DC | PRN

## 2018-07-26 MED ORDER — PROMETHAZINE HCL 25 MG/ML IJ SOLN: 6.2500 mg | INTRAMUSCULAR | Status: DC | PRN

## 2018-07-26 MED ORDER — DEXAMETHASONE SODIUM PHOSPHATE 10 MG/ML IJ SOLN
INTRAMUSCULAR | Status: AC
  Filled 2018-07-26: qty 1

## 2018-07-26 MED ORDER — THROMBIN 5000 UNITS EX SOLR
CUTANEOUS | Status: AC
  Filled 2018-07-26: qty 5000

## 2018-07-26 MED ORDER — LATANOPROST 0.005 % OP SOLN
1.0000 [drp] | Freq: Every day | OPHTHALMIC | Status: DC
  Administered 2018-07-26: 1 [drp] via OPHTHALMIC
  Filled 2018-07-26: qty 2.5

## 2018-07-26 MED ORDER — PROPOFOL 10 MG/ML IV BOLUS
INTRAVENOUS | Status: DC | PRN
  Administered 2018-07-26 (×3): 20 mg via INTRAVENOUS
  Administered 2018-07-26: 200 mg via INTRAVENOUS

## 2018-07-26 MED ORDER — ONDANSETRON HCL 4 MG/2ML IJ SOLN: 4.0000 mg | Freq: Four times a day (QID) | INTRAMUSCULAR | Status: DC | PRN

## 2018-07-26 MED ORDER — SODIUM CHLORIDE 0.9% FLUSH
3.0000 mL | Freq: Two times a day (BID) | INTRAVENOUS | Status: DC
  Administered 2018-07-26: 3 mL via INTRAVENOUS

## 2018-07-26 MED ORDER — BUPIVACAINE HCL (PF) 0.25 % IJ SOLN
INTRAMUSCULAR | Status: DC | PRN
  Administered 2018-07-26: 30 mL

## 2018-07-26 MED ORDER — CEFAZOLIN SODIUM-DEXTROSE 2-4 GM/100ML-% IV SOLN
INTRAVENOUS | Status: AC
  Filled 2018-07-26: qty 100

## 2018-07-26 MED ORDER — ACETAMINOPHEN 650 MG RE SUPP: 650.0000 mg | RECTAL | Status: DC | PRN

## 2018-07-26 MED ORDER — FLEET ENEMA 7-19 GM/118ML RE ENEM: 1.0000 | ENEMA | Freq: Once | RECTAL | Status: DC | PRN

## 2018-07-26 MED ORDER — ROCURONIUM BROMIDE 10 MG/ML (PF) SYRINGE
PREFILLED_SYRINGE | INTRAVENOUS | Status: DC | PRN
  Administered 2018-07-26 (×2): 20 mg via INTRAVENOUS
  Administered 2018-07-26: 100 mg via INTRAVENOUS

## 2018-07-26 MED ORDER — PRAVASTATIN SODIUM 40 MG PO TABS
40.0000 mg | ORAL_TABLET | Freq: Every day | ORAL | Status: DC
  Administered 2018-07-26: 40 mg via ORAL
  Filled 2018-07-26: qty 1

## 2018-07-26 MED ORDER — PANTOPRAZOLE SODIUM 40 MG PO TBEC
40.0000 mg | DELAYED_RELEASE_TABLET | Freq: Every day | ORAL | Status: DC
  Administered 2018-07-27: 40 mg via ORAL
  Filled 2018-07-26: qty 1

## 2018-07-26 MED ORDER — ACETAMINOPHEN 325 MG PO TABS: 650.0000 mg | ORAL_TABLET | ORAL | Status: DC | PRN

## 2018-07-26 MED ORDER — HYDROMORPHONE HCL 1 MG/ML IJ SOLN
0.2500 mg | INTRAMUSCULAR | Status: DC | PRN
  Administered 2018-07-26: 0.5 mg via INTRAVENOUS

## 2018-07-26 MED ORDER — THROMBIN 20000 UNITS EX SOLR
CUTANEOUS | Status: DC | PRN
  Administered 2018-07-26: 20 mL via TOPICAL

## 2018-07-26 MED ORDER — ONDANSETRON HCL 4 MG/2ML IJ SOLN
INTRAMUSCULAR | Status: DC | PRN
  Administered 2018-07-26: 4 mg via INTRAVENOUS

## 2018-07-26 MED ORDER — METFORMIN HCL 500 MG PO TABS
500.0000 mg | ORAL_TABLET | Freq: Two times a day (BID) | ORAL | Status: DC
  Administered 2018-07-26 – 2018-07-27 (×2): 500 mg via ORAL
  Filled 2018-07-26 (×2): qty 1

## 2018-07-26 MED ORDER — BISACODYL 10 MG RE SUPP: 10.0000 mg | Freq: Every day | RECTAL | Status: DC | PRN

## 2018-07-26 MED ORDER — VANCOMYCIN HCL 1000 MG IV SOLR
INTRAVENOUS | Status: AC
  Filled 2018-07-26: qty 1000

## 2018-07-26 MED ORDER — LACTATED RINGERS IV SOLN
INTRAVENOUS | Status: DC | PRN
  Administered 2018-07-26 (×2): via INTRAVENOUS

## 2018-07-26 MED ORDER — OXYCODONE HCL 5 MG/5ML PO SOLN: 5.0000 mg | Freq: Once | ORAL | Status: DC | PRN

## 2018-07-26 MED ORDER — VANCOMYCIN HCL 1 G IV SOLR
INTRAVENOUS | Status: DC | PRN
  Administered 2018-07-26: 1000 mg

## 2018-07-26 MED ORDER — MIDAZOLAM HCL 2 MG/2ML IJ SOLN
INTRAMUSCULAR | Status: DC | PRN
  Administered 2018-07-26: 2 mg via INTRAVENOUS

## 2018-07-26 MED ORDER — PHENYLEPHRINE 40 MCG/ML (10ML) SYRINGE FOR IV PUSH (FOR BLOOD PRESSURE SUPPORT)
PREFILLED_SYRINGE | INTRAVENOUS | Status: AC
  Filled 2018-07-26: qty 10

## 2018-07-26 MED ORDER — FENOFIBRATE 160 MG PO TABS
160.0000 mg | ORAL_TABLET | Freq: Every day | ORAL | Status: DC
  Administered 2018-07-26 – 2018-07-27 (×2): 160 mg via ORAL
  Filled 2018-07-26 (×2): qty 1

## 2018-07-26 MED ORDER — POLYETHYLENE GLYCOL 3350 17 G PO PACK: 17.0000 g | PACK | Freq: Every day | ORAL | Status: DC | PRN

## 2018-07-26 MED ORDER — DEXAMETHASONE SODIUM PHOSPHATE 10 MG/ML IJ SOLN
10.0000 mg | INTRAMUSCULAR | Status: AC
  Administered 2018-07-26: 10 mg via INTRAVENOUS

## 2018-07-26 MED ORDER — AMLODIPINE BESYLATE 5 MG PO TABS
5.0000 mg | ORAL_TABLET | Freq: Every day | ORAL | Status: DC
  Administered 2018-07-27: 5 mg via ORAL
  Filled 2018-07-26: qty 1

## 2018-07-26 SURGICAL SUPPLY — 65 items
BAG DECANTER FOR FLEXI CONT (MISCELLANEOUS) ×3 IMPLANT
BENZOIN TINCTURE PRP APPL 2/3 (GAUZE/BANDAGES/DRESSINGS) ×3 IMPLANT
BLADE CLIPPER SURG (BLADE) ×3 IMPLANT
BUR CUTTER 7.0 ROUND (BURR) ×3 IMPLANT
BUR MATCHSTICK NEURO 3.0 LAGG (BURR) ×3 IMPLANT
CANISTER SUCT 3000ML PPV (MISCELLANEOUS) ×3 IMPLANT
CAP LCK SPNE (Orthopedic Implant) ×4 IMPLANT
CAP LOCK SPINE RADIUS (Orthopedic Implant) ×4 IMPLANT
CAP LOCKING (Orthopedic Implant) ×8 IMPLANT
CARTRIDGE OIL MAESTRO DRILL (MISCELLANEOUS) ×1 IMPLANT
CLOSURE STERI-STRIP 1/2X4 (GAUZE/BANDAGES/DRESSINGS) ×1
CLOSURE WOUND 1/2 X4 (GAUZE/BANDAGES/DRESSINGS) ×2
CLSR STERI-STRIP ANTIMIC 1/2X4 (GAUZE/BANDAGES/DRESSINGS) ×2 IMPLANT
CONT SPEC 4OZ CLIKSEAL STRL BL (MISCELLANEOUS) ×3 IMPLANT
COVER BACK TABLE 60X90IN (DRAPES) ×3 IMPLANT
COVER WAND RF STERILE (DRAPES) ×3 IMPLANT
DECANTER SPIKE VIAL GLASS SM (MISCELLANEOUS) ×3 IMPLANT
DERMABOND ADVANCED (GAUZE/BANDAGES/DRESSINGS) ×2
DERMABOND ADVANCED .7 DNX12 (GAUZE/BANDAGES/DRESSINGS) ×1 IMPLANT
DEVICE INTERBODY ELEVATE 23X9 (Cage) ×6 IMPLANT
DIFFUSER DRILL AIR PNEUMATIC (MISCELLANEOUS) ×3 IMPLANT
DRAPE C-ARM 42X72 X-RAY (DRAPES) ×6 IMPLANT
DRAPE HALF SHEET 40X57 (DRAPES) ×3 IMPLANT
DRAPE LAPAROTOMY 100X72X124 (DRAPES) ×3 IMPLANT
DRAPE SURG 17X23 STRL (DRAPES) ×12 IMPLANT
DRSG OPSITE POSTOP 4X6 (GAUZE/BANDAGES/DRESSINGS) ×3 IMPLANT
DRSG OPSITE POSTOP 4X8 (GAUZE/BANDAGES/DRESSINGS) ×3 IMPLANT
DURAPREP 26ML APPLICATOR (WOUND CARE) ×3 IMPLANT
ELECT REM PT RETURN 9FT ADLT (ELECTROSURGICAL) ×3
ELECTRODE REM PT RTRN 9FT ADLT (ELECTROSURGICAL) ×1 IMPLANT
EVACUATOR 1/8 PVC DRAIN (DRAIN) IMPLANT
GAUZE 4X4 16PLY RFD (DISPOSABLE) ×3 IMPLANT
GAUZE SPONGE 4X4 12PLY STRL (GAUZE/BANDAGES/DRESSINGS) IMPLANT
GLOVE BIOGEL PI IND STRL 7.0 (GLOVE) ×2 IMPLANT
GLOVE BIOGEL PI IND STRL 7.5 (GLOVE) ×2 IMPLANT
GLOVE BIOGEL PI INDICATOR 7.0 (GLOVE) ×4
GLOVE BIOGEL PI INDICATOR 7.5 (GLOVE) ×4
GLOVE ECLIPSE 9.0 STRL (GLOVE) ×6 IMPLANT
GLOVE EXAM NITRILE XL STR (GLOVE) IMPLANT
GLOVE SURG SS PI 7.0 STRL IVOR (GLOVE) ×12 IMPLANT
GOWN STRL REUS W/ TWL LRG LVL3 (GOWN DISPOSABLE) ×2 IMPLANT
GOWN STRL REUS W/ TWL XL LVL3 (GOWN DISPOSABLE) ×2 IMPLANT
GOWN STRL REUS W/TWL 2XL LVL3 (GOWN DISPOSABLE) IMPLANT
GOWN STRL REUS W/TWL LRG LVL3 (GOWN DISPOSABLE) ×4
GOWN STRL REUS W/TWL XL LVL3 (GOWN DISPOSABLE) ×4
KIT BASIN OR (CUSTOM PROCEDURE TRAY) ×3 IMPLANT
KIT TURNOVER KIT B (KITS) ×3 IMPLANT
MILL MEDIUM DISP (BLADE) ×3 IMPLANT
NEEDLE HYPO 22GX1.5 SAFETY (NEEDLE) ×3 IMPLANT
NS IRRIG 1000ML POUR BTL (IV SOLUTION) ×3 IMPLANT
OIL CARTRIDGE MAESTRO DRILL (MISCELLANEOUS) ×3
PACK LAMINECTOMY NEURO (CUSTOM PROCEDURE TRAY) ×3 IMPLANT
ROD RADIUS 45MM (Rod) ×4 IMPLANT
ROD SPNL 45X5.5XNS TI RDS (Rod) ×2 IMPLANT
SCREW 6.75X45MM (Screw) ×12 IMPLANT
SPONGE SURGIFOAM ABS GEL 100 (HEMOSTASIS) ×3 IMPLANT
STRIP CLOSURE SKIN 1/2X4 (GAUZE/BANDAGES/DRESSINGS) ×4 IMPLANT
SUT VIC AB 0 CT1 18XCR BRD8 (SUTURE) ×1 IMPLANT
SUT VIC AB 0 CT1 8-18 (SUTURE) ×2
SUT VIC AB 2-0 CT1 18 (SUTURE) ×6 IMPLANT
SUT VIC AB 3-0 SH 8-18 (SUTURE) ×6 IMPLANT
TOWEL GREEN STERILE (TOWEL DISPOSABLE) ×3 IMPLANT
TOWEL GREEN STERILE FF (TOWEL DISPOSABLE) ×3 IMPLANT
TRAY FOLEY MTR SLVR 16FR STAT (SET/KITS/TRAYS/PACK) ×3 IMPLANT
WATER STERILE IRR 1000ML POUR (IV SOLUTION) ×3 IMPLANT

## 2018-07-26 NOTE — Op Note (Signed)
Date of procedure: 07/26/2018  Date of dictation: Same  Service: Neurosurgery  Preoperative diagnosis: L3-L4 lytic spondylolisthesis, grade 1 with radiculopathy  Postoperative diagnosis: Same  Procedure Name: L3 laminectomy with bilateral L3 and L4 facetectomies with foraminotomies (Gill procedure), more than would be required for simple interbody fusion alone.  L3-4 posterior lumbar interbody fusion utilizing interbody cages and locally harvested autograft  L3-4 posterior lateral arthrodesis utilizing nonsegmental pedicle screw fixation and local autograft  Surgeon:Pola Furno A.Jakoby Melendrez, M.D.  Asst. Surgeon: Venetia Constable  Anesthesia: General  Indication: 67 year old male with severe back and right lower extremity pain paresthesias and weakness consistent with a right-sided L3 radiculopathy which is failed conservative management.  Work-up demonstrates evidence of a mobile grade 1 L3 lytic L3-4 spondylolisthesis with severe foraminal stenosis.  Patient presents now for decompression and fusion in hopes of improving his symptoms.  Operative note: After induction anesthesia, patient position prone on the Wilson frame and properly padded.  Lumbar region prepped and draped sterilely.  Incision made overlying L3-4.  Dissection performed bilaterally.  Retractor placed.  Fluoroscopy used.  Levels confirmed.  Gill procedure then performed using Vickie Epley and Kerrison Stann Mainland to remove the entire lamina of L3.  Inferior facets were removed at L3 bilaterally the majority the superior facets of L4 were removed bilaterally and rudimentary processes off the inferior pedicles of L3 were also resected completing foraminotomies along the course the exiting L3 and L4 nerve roots bilaterally.  Bilateral discectomies then performed at L3-4.  The space was then entered and distracted with various distractors to reduce the patient's deformity.  The space then prepared for interbody fusion with.  With a distractor placed  the patient's right side the space prepared for interbody fusion.  A 9 mm [employer] expandable cage packed with locally harvested autograft and impacted in place and expanded to its full extent.  Distractor was removed patient's right side.  The space then prepared on the right side.  Morselized autograft packed in the interspace.  A second 9 mm cage was then impacted into place and expanded to its full extent.  Pedicles of L3 and L4 were then identified using surface landmarks and intraoperative fluoroscopy with superficial bone overlying the pedicle was then removed using high-speed drill.  Pedicle was then probed using a pedicle all each pedicle all track was then probed and found to be solidly within the bone.  Each pedicle tract was then tapped and then 6.75 mm radius bran screws from [employer] were placed bilaterally at L3 and L4.  Final images reveal good position of the hardware and the cages at the proper operative level with good reduction of the patient's deformity.  Transverse processes and residual facets were further decorticated.  Morselized autograft was packed posterior laterally.  Short segment titanium rod placed of the screw heads at L3 and L4.  Locking caps placed of the screws and locking caps and engaged with a construct under compression.  Gelfoam was placed over the laminectomy defect.  Wound is then closed in layers of Vicryl sutures.  Vancomycin powder was left in the deep wound space.  Steri-Strips and sterile dressing were applied.  No apparent complications.  Patient tolerated the procedure well and he returns to the recovery room postop.

## 2018-07-26 NOTE — Transfer of Care (Signed)
Immediate Anesthesia Transfer of Care Note  Patient: [name]  Procedure(s) Performed: POSTERIOR LUMBAR INTERBODY FUSION - LUMBAR THREE-LUMBAR FOUR - Posterior Lateral and Interbody fusion (N/A Back)  Patient Location: PACU  Anesthesia Type:General  Level of Consciousness: awake and alert   Airway & Oxygen Therapy: Patient Spontanous Breathing and Patient connected to nasal cannula oxygen  Post-op Assessment: Report given to RN and Post -op Vital signs reviewed and stable  Post vital signs: Reviewed and stable  Last Vitals:  Vitals Value Taken Time  BP 106/78 07/26/2018 10:14 AM  Temp 36.2 C 07/26/2018 10:14 AM  Pulse 106 07/26/2018 10:20 AM  Resp 14 07/26/2018 10:18 AM  SpO2 95 % 07/26/2018 10:20 AM  Vitals shown include unvalidated device data.  Last Pain:  Vitals:   07/26/18 0623  PainSc: 0-No pain      Patients Stated Pain Goal: 2 (76/72/09 4709)  Complications: No apparent anesthesia complications

## 2018-07-26 NOTE — Brief Op Note (Signed)
07/26/2018  9:57 AM  PATIENT:  [name]  67 y.o. male  PRE-OPERATIVE DIAGNOSIS:  Spondylolisthesis  POST-OPERATIVE DIAGNOSIS:  Spondylolisthesis  PROCEDURE:  Procedure(s): POSTERIOR LUMBAR INTERBODY FUSION - LUMBAR THREE-LUMBAR FOUR - Posterior Lateral and Interbody fusion (N/A)  SURGEON:  Surgeon(s) and Role:    * Earnie Larsson, MD - Primary    * Ostergard, Joyice Faster, MD - Assisting  PHYSICIAN ASSISTANT:   ASSISTANTS:    ANESTHESIA:   general  EBL:  150 mL   BLOOD ADMINISTERED:none  DRAINS: none   LOCAL MEDICATIONS USED:  MARCAINE     SPECIMEN:  No Specimen  DISPOSITION OF SPECIMEN:  N/A  COUNTS:  YES  TOURNIQUET:  * No tourniquets in log *  DICTATION: .Dragon Dictation  PLAN OF CARE: Admit to inpatient   PATIENT DISPOSITION:  PACU - hemodynamically stable.   Delay start of Pharmacological VTE agent (>24hrs) due to surgical blood loss or risk of bleeding: yes

## 2018-07-26 NOTE — Anesthesia Postprocedure Evaluation (Signed)
Anesthesia Post Note  Patient: [name]  Procedure(s) Performed: POSTERIOR LUMBAR INTERBODY FUSION - LUMBAR THREE-LUMBAR FOUR - Posterior Lateral and Interbody fusion (N/A Back)     Patient location during evaluation: PACU Anesthesia Type: General Level of consciousness: sedated and patient cooperative Pain management: pain level controlled Vital Signs Assessment: post-procedure vital signs reviewed and stable Respiratory status: spontaneous breathing Cardiovascular status: stable Anesthetic complications: no    Last Vitals:  Vitals:   07/26/18 1115 07/26/18 1129  BP: (!) 92/59 100/69  Pulse: 87 82  Resp: 20 13  Temp:  36.6 C  SpO2: 99% 98%    Last Pain:  Vitals:   07/26/18 1100  PainSc: [place]

## 2018-07-26 NOTE — H&P (Signed)
[name] is an 67 y.o. male.   Chief Complaint: Back pain HPI: 67 year old male with chronic and progressive back pain with radiation into his right anterior thigh failing conservative management.  Work-up demonstrates evidence of a lytic spondylolisthesis at L3-4 with severe foraminal stenosis and some degree of foraminal disc herniation on the right at L3-4.  Remainder of his lumbar spine appears healthy.  Patient presents now for lumbar decompression and fusion in hopes of improving his symptoms.  Past Medical History:  Diagnosis Date  . Bladder neck obstruction   . Colon polyp   . Diabetes mellitus without complication ([place])   . GERD (gastroesophageal reflux disease)   . Glaucoma   . Hyperlipidemia   . Hypertension   . Prediabetes   . Prosthetic eye globe    Right eye  . Sleep apnea    does not use machine per pt.  Paulla Fore     Past Surgical History:  Procedure Laterality Date  . arthroscopic knee surgery    . COLONOSCOPY     DP  . ELBOW SURGERY    . fractured radius surgery    . POLYPECTOMY    . right knee tka    . TONSILLECTOMY     removed as child unsure of age  . TOTAL KNEE ARTHROPLASTY    . traumatic eye enucleation  2011   right    Family History  Problem Relation Age of Onset  . Colon cancer Brother 73  . Cancer Father        lung (smoker)  . Colon polyps Neg Hx   . Esophageal cancer Neg Hx   . Rectal cancer Neg Hx   . Stomach cancer Neg Hx   . Pancreatic cancer Neg Hx   . Liver cancer Neg Hx   . Prostate cancer Neg Hx    Social History:  reports that he has been smoking cigarettes. He has been smoking about 0.25 packs per day. He has never used smokeless tobacco. He reports that he drinks alcohol. He reports that he does not use drugs.  Allergies: No Known Allergies  Facility-Administered Medications Prior to Admission  Medication Dose Route Frequency Provider Last Rate Last Dose  . 0.9 %  sodium chloride infusion  500 mL Intravenous Once  Pyrtle, Lajuan Lines, MD       Medications Prior to Admission  Medication Sig Dispense Refill  . amLODipine (NORVASC) 5 MG tablet TAKE 1 TABLET BY MOUTH EVERY DAY (Patient taking differently: Take 5 mg by mouth daily. ) 90 tablet 3  . fenofibrate 160 MG tablet TAKE 1 TABLET BY MOUTH EVERY DAY (Patient taking differently: Take 160 mg by mouth daily. ) 90 tablet 1  . latanoprost (XALATAN) 0.005 % ophthalmic solution Place 1 drop into the left eye at bedtime.   11  . lisinopril-hydrochlorothiazide (PRINZIDE,ZESTORETIC) 20-25 MG tablet TAKE 1 TABLET BY MOUTH EVERY DAY (Patient taking differently: Take 1 tablet by mouth daily. ) 90 tablet 2  . metFORMIN (GLUCOPHAGE) 500 MG tablet TAKE 1 TABLET BY MOUTH TWICE A DAY WITH A MEAL (Patient taking differently: Take 500 mg by mouth 2 (two) times daily with a meal. ) 180 tablet 0  . omeprazole (PRILOSEC) 20 MG capsule Take 20 mg by mouth daily.    . pravastatin (PRAVACHOL) 40 MG tablet TAKE 1 TABLET BY MOUTH DAILY. (Patient taking differently: Take 40 mg by mouth daily. ) 90 tablet 1    Results for orders placed or performed during  the hospital encounter of 07/26/18 (from the past 48 hour(s))  Glucose, capillary     Status: Abnormal   Collection Time: 07/26/18  5:51 AM  Result Value Ref Range   Glucose-Capillary 138 (H) 70 - 99 mg/dL   No results found.  Pertinent items noted in HPI and remainder of comprehensive ROS otherwise negative.  Blood pressure 132/86, pulse 85, temperature 98.3 F (36.8 C), resp. rate 20, height 6' (1.829 m), weight 120 kg, SpO2 96 %.  Patient is awake and alert.  He is oriented and appropriate.  Speech is fluent.  Judgment and insight are intact.  Cranial nerve function normal bilateral.  Motor examination reveals weakness of his right quadriceps muscle group grading out at 4/5 with some wasting.  Patient with some mild weakness of his right anterior tibialis.  Remainder of his motor strength intact.  Sensory examination with  decreased sensation pinprick and light touch in his right L3 and L4 dermatomes.  Deep tender mixes are normal active symptoms right patellar reflexes diminished in his Achilles reflexes are hypoactive bilaterally.  Gait is antalgic.  Posture is flexed.  Examination head ears eyes nose throat is unremarkable her chest and abdomen are benign.  Extremities are free from injury or deformity. Assessment/Plan L3-L4 grade 1 lytic spondylolisthesis with severe radiculopathy.  Plan bilateral L3 Gill procedure with bilateral L3 and L4 decompressive foraminotomies followed by posterior lumbar interbody fusion utilizing interbody cages, locally harvested autograft, and augmented with posterior lateral arthrodesis utilizing nonsegmental pedicle screw fixation and local autografting.  Risks and benefits of been explained.  Patient wishes to proceed.  Shariya Gaster A Kelton Bultman 07/26/2018, 7:17 AM

## 2018-07-26 NOTE — Anesthesia Procedure Notes (Signed)
Procedure Name: Intubation Date/Time: 07/26/2018 7:35 AM Performed by: Valda Favia, CRNA Pre-anesthesia Checklist: Patient identified, Emergency Drugs available, Suction available and Patient being monitored Patient Re-evaluated:Patient Re-evaluated prior to induction Oxygen Delivery Method: Circle System Utilized Preoxygenation: Pre-oxygenation with 100% oxygen Induction Type: IV induction Ventilation: Mask ventilation without difficulty and Oral airway inserted - appropriate to patient size Laryngoscope Size: Mac and 4 Grade View: Grade II Tube type: Oral Tube size: 7.5 mm Number of attempts: 1 Airway Equipment and Method: Stylet and Oral airway Placement Confirmation: ETT inserted through vocal cords under direct vision,  positive ETCO2 and breath sounds checked- equal and bilateral Secured at: 23 cm Tube secured with: Tape Dental Injury: Teeth and Oropharynx as per pre-operative assessment

## 2018-07-26 NOTE — Plan of Care (Signed)
  Problem: Education: Goal: Ability to verbalize activity precautions or restrictions will improve Outcome: Progressing Goal: Knowledge of the prescribed therapeutic regimen will improve Outcome: Progressing Goal: Understanding of discharge needs will improve Outcome: Progressing   Problem: Activity: Goal: Ability to avoid complications of mobility impairment will improve Outcome: Progressing Goal: Ability to tolerate increased activity will improve Outcome: Progressing Goal: Will remain free from falls Outcome: Progressing   Problem: Clinical Measurements: Goal: Ability to maintain clinical measurements within normal limits will improve Outcome: Progressing Goal: Postoperative complications will be avoided or minimized Outcome: Progressing Goal: Diagnostic test results will improve Outcome: Progressing   Problem: Pain Management: Goal: Pain level will decrease Outcome: Progressing   Problem: Bladder/Genitourinary: Goal: Urinary functional status for postoperative course will improve Outcome: Progressing   Problem: Pain Managment: Goal: General experience of comfort will improve Outcome: Progressing   Problem: Safety: Goal: Ability to remain free from injury will improve Outcome: Progressing

## 2018-07-27 LAB — GLUCOSE, CAPILLARY: Glucose-Capillary: 117 mg/dL — ABNORMAL HIGH (ref 70–99)

## 2018-07-27 MED ORDER — OXYCODONE HCL 10 MG PO TABS: 5.0000 mg | ORAL_TABLET | ORAL | 0 refills | Status: DC | PRN

## 2018-07-27 MED ORDER — CYCLOBENZAPRINE HCL 10 MG PO TABS: 10.0000 mg | ORAL_TABLET | Freq: Three times a day (TID) | ORAL | 0 refills | Status: DC | PRN

## 2018-07-27 MED FILL — Sodium Chloride IV Soln 0.9%: INTRAVENOUS | Qty: 1000 | Status: AC

## 2018-07-27 MED FILL — Heparin Sodium (Porcine) Inj 1000 Unit/ML: INTRAMUSCULAR | Qty: 30 | Status: AC

## 2018-07-27 NOTE — Evaluation (Signed)
Physical Therapy Evaluation Patient Details Name: TERRANCE USERY MRN: [id number] DOB: [birth year] Today's Date: 07/27/2018   History of Present Illness  Pt is a 67 y/o male admitted s/p lumbar decompression and fustion at L3-4. PMH significant for but not limited to: HTN, prosthetic eye globe R eye, R knee TKA.   Clinical Impression  Pt admitted with above diagnosis. Pt currently with functional limitations due to the deficits listed below (see PT Problem List). At the time of PT eval pt was able to perform transfers and ambulation with gross supervision for safety without an AD. Pt was educated on brace application/wearing schedule, activity progression, car transfer, and maintenance of precautions. Pt will benefit from skilled PT to increase their independence and safety with mobility to allow discharge to the venue listed below.       Follow Up Recommendations No PT follow up;Supervision for mobility/OOB    Equipment Recommendations  None recommended by PT    Recommendations for Other Services       Precautions / Restrictions Precautions Precautions: Fall;Back Precaution Booklet Issued: Yes (comment) Precaution Comments: reviewed precautions thoroughly with pt  Required Braces or Orthoses: Spinal Brace Spinal Brace: Lumbar corset;Applied in sitting position(adjusted in standing) Restrictions Weight Bearing Restrictions: No      Mobility  Bed Mobility Overal bed mobility: Needs Assistance Bed Mobility: Rolling;Sidelying to Sit;Sit to Sidelying Rolling: Modified independent (Device/Increase time) Sidelying to sit: Supervision     Sit to sidelying: Supervision General bed mobility comments: supervision for safety and proper log roll technique. HOB flat to simulate home environment.   Transfers Overall transfer level: Needs assistance Equipment used: None Transfers: Sit to/from Stand Sit to Stand: Supervision         General transfer comment: VC's for hand placement  on seated surface for safety. No assist required to power-up to full stand however supervision provided for safety.   Ambulation/Gait Ambulation/Gait assistance: Supervision Gait Distance (Feet): 250 Feet Assistive device: None Gait Pattern/deviations: Step-through pattern;Decreased dorsiflexion - left;Trunk flexed Gait velocity: Decreased Gait velocity interpretation: 1.31 - 2.62 ft/sec, indicative of limited community ambulator General Gait Details: VC's for improved posture and increased heel strike on the L. Pt able to make corrective changes but unable to maintain.   Stairs Stairs: Yes Stairs assistance: Min guard Stair Management: One rail Right;Step to pattern;Forwards Number of Stairs: 7 General stair comments: VC's for sequencing and general safety. Pt was able to negotiate 7 stairs forwards, but was also educated on sideways management if pt felt he needed BUE support on railing at home.  Wheelchair Mobility    Modified Rankin (Stroke Patients Only)       Balance Overall balance assessment: Mild deficits observed, not formally tested                                           Pertinent Vitals/Pain Pain Assessment: Faces Faces Pain Scale: Hurts a little bit Pain Location: incisional Pain Descriptors / Indicators: Discomfort Pain Intervention(s): Monitored during session    Home Living Family/patient expects to be discharged to:: Private residence Living Arrangements: Alone Available Help at Discharge: Family;Friend(s);Available PRN/intermittently Type of Home: Mobile home Home Access: Stairs to enter Entrance Stairs-Rails: Right;Left Entrance Stairs-Number of Steps: 3 front, 7 back Home Layout: One level Home Equipment: Walker - 2 wheels;Bedside commode;Walker - 4 wheels;Crutches;Adaptive equipment      Prior Function Level of  Independence: Independent         Comments: reports independent and driving, intermittently using rollator for  long distances      Hand Dominance        Extremity/Trunk Assessment   Upper Extremity Assessment Upper Extremity Assessment: Overall WFL for tasks assessed    Lower Extremity Assessment Lower Extremity Assessment: Generalized weakness;LLE deficits/detail LLE Deficits / Details: Pt reports he needs a knee replacement on this side and the knee sometimes "pops out". Unsure what this means, but no buckling noted during session. I did observe decreased heel strike during ambulation but pt was able to correct with VC's.     Cervical / Trunk Assessment Cervical / Trunk Assessment: Other exceptions Cervical / Trunk Exceptions: s/p lumbar sx  Communication   Communication: No difficulties  Cognition Arousal/Alertness: Awake/alert Behavior During Therapy: WFL for tasks assessed/performed Overall Cognitive Status: Within Functional Limits for tasks assessed                                        General Comments      Exercises     Assessment/Plan    PT Assessment Patient needs continued PT services  PT Problem List Decreased strength;Decreased balance;Decreased activity tolerance;Decreased mobility;Decreased knowledge of use of DME;Decreased safety awareness;Decreased knowledge of precautions;Pain       PT Treatment Interventions DME instruction;Gait training;Stair training;Functional mobility training;Therapeutic activities;Therapeutic exercise;Neuromuscular re-education;Patient/family education    PT Goals (Current goals can be found in the Care Plan section)  Acute Rehab PT Goals Patient Stated Goal: home today PT Goal Formulation: With patient Time For Goal Achievement: 08/03/18 Potential to Achieve Goals: Good    Frequency Min 5X/week   Barriers to discharge        Co-evaluation               AM-PAC PT "6 Clicks" Daily Activity  Outcome Measure Difficulty turning over in bed (including adjusting bedclothes, sheets and blankets)?:  None Difficulty moving from lying on back to sitting on the side of the bed? : A Little Difficulty sitting down on and standing up from a chair with arms (e.g., wheelchair, bedside commode, etc,.)?: A Little Help needed moving to and from a bed to chair (including a wheelchair)?: A Little Help needed walking in hospital room?: A Little Help needed climbing 3-5 steps with a railing? : A Little 6 Click Score: 19    End of Session Equipment Utilized During Treatment: Gait belt;Back brace Activity Tolerance: Patient tolerated treatment well Patient left: in bed;with call bell/phone within reach Nurse Communication: Mobility status PT Visit Diagnosis: Unsteadiness on feet (R26.81);Pain Pain - part of body: (back)    Time: 8850-2774 PT Time Calculation (min) (ACUTE ONLY): 16 min   Charges:   PT Evaluation $PT Eval Moderate Complexity: 1 Mod          Rolinda Roan, PT, DPT Acute Rehabilitation Services Pager: [phone] Office: [phone]   Thelma Comp 07/27/2018, 9:17 AM

## 2018-07-27 NOTE — Discharge Summary (Signed)
Physician Discharge Summary  Patient ID: [name] MRN: [id number] DOB/AGE: April 02, 1951 67 y.o.  Admit date: 07/26/2018 Discharge date: 07/27/2018  Admission Diagnoses:  Discharge Diagnoses:  Active Problems:   Spondylolisthesis at L3-L4 level   Discharged Condition: good  Hospital Course: Patient admitted to hospital where he underwent uncomplicated lumbar decompression and fusion surgery at L3-4.  Preoperative back and lower extremity pain much improved.  Standing and walking better.  Ready for discharge home.  Consults:   Significant Diagnostic Studies:   Treatments:   Discharge Exam: Blood pressure 125/87, pulse 87, temperature 97.9 F (36.6 C), temperature source Oral, resp. rate 20, height 6' (1.829 m), weight 120 kg, SpO2 97 %. Awake and alert.  Oriented and appropriate.  Motor and sensory function intact.  Wound clean and dry.  Chest and abdomen benign.  Disposition: Discharge disposition: 01-Home or Self Care        Allergies as of 07/27/2018   No Known Allergies     Medication List    TAKE these medications   amLODipine 5 MG tablet Commonly known as:  NORVASC TAKE 1 TABLET BY MOUTH EVERY DAY   cyclobenzaprine 10 MG tablet Commonly known as:  FLEXERIL Take 1 tablet (10 mg total) by mouth 3 (three) times daily as needed for muscle spasms.   fenofibrate 160 MG tablet TAKE 1 TABLET BY MOUTH EVERY DAY   latanoprost 0.005 % ophthalmic solution Commonly known as:  XALATAN Place 1 drop into the left eye at bedtime.   lisinopril-hydrochlorothiazide 20-25 MG tablet Commonly known as:  PRINZIDE,ZESTORETIC TAKE 1 TABLET BY MOUTH EVERY DAY   metFORMIN 500 MG tablet Commonly known as:  GLUCOPHAGE TAKE 1 TABLET BY MOUTH TWICE A DAY WITH A MEAL What changed:  See the new instructions.   omeprazole 20 MG capsule Commonly known as:  PRILOSEC Take 20 mg by mouth daily.   Oxycodone HCl 10 MG Tabs Take 0.5-1 tablets (5-10 mg total) by mouth every 4  (four) hours as needed for severe pain ((score 7 to 10)).   pravastatin 40 MG tablet Commonly known as:  PRAVACHOL TAKE 1 TABLET BY MOUTH DAILY.            Durable Medical Equipment  (From admission, onward)         Start     Ordered   07/26/18 1200  DME Walker rolling  Once    Question:  Patient needs a walker to treat with the following condition  Answer:  Spondylolisthesis at L3-L4 level   07/26/18 1159   07/26/18 1200  DME 3 n 1  Once     07/26/18 1159           Signed: Mallie Mussel A Aliviah Spain 07/27/2018, 7:57 AM

## 2018-07-27 NOTE — Discharge Instructions (Signed)

## 2018-07-27 NOTE — Evaluation (Addendum)
Occupational Therapy Evaluation and Discharge Patient Details Name: [name] MRN: [id number] DOB: June 14, 1951 Today's Date: 07/27/2018    History of Present Illness Pt is a 67 y/o male admitted s/p lumbar decompression and fustion at L3-4. PMH significant for but not limited to: HTN, prosthetic eye globe R eye, R knee TKA.    Clinical Impression   PTA patient independent and driving.  Currently admitted for above and limited by problem list below.  Patient educated on precautions, back brace management and wear schedule, activity modifications, ADL compensatory techniques and AE, mobility, safety and recommendations.  Patient currently requires setup assist for UB ADLs, supervision for LB ADLs using AE (reacher, sock aide and long sponge) as unable to complete figure 4 technique, supervision for bed mobility and transfers.  Patient reports he will have support at home as needed, as he lives alone.   Based on performance today, no further OT needs identified as patient understands recommendations, equipment use and education.  Thank you for this referral.  OT signing off.     Follow Up Recommendations  No OT follow up;Supervision - Intermittent    Equipment Recommendations  Other (comment)(AE: sock aide, long sponge )    Recommendations for Other Services PT consult     Precautions / Restrictions Precautions Precautions: Fall;Back Precaution Booklet Issued: Yes (comment) Precaution Comments: reviewed precautions thoroughly with pt  Required Braces or Orthoses: Spinal Brace Spinal Brace: Lumbar corset;Applied in sitting position(adjusted in standing) Restrictions Weight Bearing Restrictions: No      Mobility Bed Mobility Overal bed mobility: Needs Assistance Bed Mobility: Rolling;Sidelying to Sit Rolling: Supervision Sidelying to sit: Supervision       General bed mobility comments: supervision for safety  Transfers Overall transfer level: Needs assistance Equipment  used: None Transfers: Sit to/from Stand Sit to Stand: Supervision         General transfer comment: supervision for safety    Balance Overall balance assessment: Mild deficits observed, not formally tested                                         ADL either performed or assessed with clinical judgement   ADL Overall ADL's : Needs assistance/impaired     Grooming: Supervision/safety;Standing   Upper Body Bathing: Set up;Sitting   Lower Body Bathing: Supervison/ safety;With adaptive equipment;Cueing for back precautions;Sitting/lateral leans;Cueing for compensatory techniques Lower Body Bathing Details (indicate cue type and reason): unable to complete figure 4 technique, demonstrated use with long sponge with supervision  Upper Body Dressing : Supervision/safety;Sitting   Lower Body Dressing: Supervision/safety;Sit to/from stand;Cueing for compensatory techniques;Cueing for back precautions;With adaptive equipment Lower Body Dressing Details (indicate cue type and reason): unable to complete figure 4 technique, return demonstrated use of AE with supervision for LB dressing and precaution adherance Toilet Transfer: Supervision/safety;Ambulation   Toileting- Clothing Manipulation and Hygiene: Supervision/safety;Sit to/from stand;Cueing for compensatory techniques   Tub/ Shower Transfer: Walk-in shower;Supervision/safety;Ambulation;3 in 1   Functional mobility during ADLs: Supervision/safety General ADL Comments: Patient educated on precautions and ADl compensatory techniques, AE, and safety     Vision Baseline Vision/History: Wears glasses(R prosthetic eye) Wears Glasses: At all times Patient Visual Report: No change from baseline Vision Assessment?: No apparent visual deficits     Perception     Praxis      Pertinent Vitals/Pain Pain Assessment: Faces Faces Pain Scale: Hurts a little bit Pain  Location: incisional Pain Descriptors / Indicators:  Discomfort Pain Intervention(s): Monitored during session     Hand Dominance     Extremity/Trunk Assessment Upper Extremity Assessment Upper Extremity Assessment: Overall WFL for tasks assessed   Lower Extremity Assessment Lower Extremity Assessment: Defer to PT evaluation   Cervical / Trunk Assessment Cervical / Trunk Assessment: Other exceptions Cervical / Trunk Exceptions: s/p lumbar sx   Communication Communication Communication: No difficulties   Cognition Arousal/Alertness: Awake/alert Behavior During Therapy: WFL for tasks assessed/performed Overall Cognitive Status: Within Functional Limits for tasks assessed                                     General Comments       Exercises     Shoulder Instructions      Home Living Family/patient expects to be discharged to:: Private residence Living Arrangements: Alone Available Help at Discharge: Family;Friend(s);Available PRN/intermittently Type of Home: Mobile home Home Access: Stairs to enter Entrance Stairs-Number of Steps: 3 front, 7 back Entrance Stairs-Rails: Right;Left Home Layout: One level     Bathroom Shower/Tub: Occupational psychologist: Handicapped height     Home Equipment: Environmental consultant - 2 wheels;Bedside commode;Walker - 4 wheels;Crutches;Adaptive equipment Adaptive Equipment: Reacher        Prior Functioning/Environment Level of Independence: Independent        Comments: reports independent and driving, intermittently using rollator for long distances         OT Problem List: Decreased activity tolerance;Impaired balance (sitting and/or standing);Decreased safety awareness;Decreased knowledge of use of DME or AE;Decreased knowledge of precautions;Pain      OT Treatment/Interventions:      OT Goals(Current goals can be found in the care plan section) Acute Rehab OT Goals Patient Stated Goal: home today OT Goal Formulation: With patient  OT Frequency:     Barriers  to D/C:            Co-evaluation              AM-PAC PT "6 Clicks" Daily Activity     Outcome Measure Help from another person eating meals?: None Help from another person taking care of personal grooming?: None Help from another person toileting, which includes using toliet, bedpan, or urinal?: None Help from another person bathing (including washing, rinsing, drying)?: None Help from another person to put on and taking off regular upper body clothing?: None Help from another person to put on and taking off regular lower body clothing?: None 6 Click Score: 24   End of Session Equipment Utilized During Treatment: Back brace Nurse Communication: Mobility status  Activity Tolerance: Patient tolerated treatment well Patient left: with call bell/phone within reach;Other (comment)(seated EOB)  OT Visit Diagnosis: Other abnormalities of gait and mobility (R26.89);Pain Pain - part of body: (incisional)                Time: 4103-0131 OT Time Calculation (min): 25 min Charges:  OT General Charges $OT Visit: 1 Visit OT Evaluation $OT Eval Moderate Complexity: 1 Mod OT Treatments $Self Care/Home Management : 8-22 mins  Delight Stare, OT Acute Rehabilitation Services Pager [phone] Office [phone]   Delight Stare 07/27/2018, 8:48 AM

## 2018-07-27 NOTE — Progress Notes (Signed)
Patient is discharged from room 3C07 at this time. Alert and in stable condition. IV site d/c'd and instructions read to patient and daughter with understanding verbalized. Left unit via wheelchair with all belongings at side.  

## 2018-07-28 MED FILL — Thrombin (Recombinant) For Soln 20000 Unit: CUTANEOUS | Qty: 1 | Status: AC

## 2018-08-10 ENCOUNTER — Other Ambulatory Visit: Payer: Self-pay | Admitting: Family Medicine

## 2018-08-12 ENCOUNTER — Encounter: Payer: Self-pay | Admitting: Family Medicine

## 2018-08-12 DIAGNOSIS — H401123 Primary open-angle glaucoma, left eye, severe stage: Secondary | ICD-10-CM | POA: Diagnosis not present

## 2018-08-26 DIAGNOSIS — Z6833 Body mass index (BMI) 33.0-33.9, adult: Secondary | ICD-10-CM | POA: Diagnosis not present

## 2018-08-26 DIAGNOSIS — M4316 Spondylolisthesis, lumbar region: Secondary | ICD-10-CM | POA: Diagnosis not present

## 2018-10-05 DIAGNOSIS — M4316 Spondylolisthesis, lumbar region: Secondary | ICD-10-CM | POA: Diagnosis not present

## 2018-11-10 DIAGNOSIS — M4316 Spondylolisthesis, lumbar region: Secondary | ICD-10-CM | POA: Diagnosis not present

## 2018-11-11 ENCOUNTER — Other Ambulatory Visit: Payer: Self-pay | Admitting: Family Medicine

## 2018-11-11 DIAGNOSIS — E785 Hyperlipidemia, unspecified: Secondary | ICD-10-CM

## 2018-11-24 ENCOUNTER — Other Ambulatory Visit: Payer: Self-pay | Admitting: Family Medicine

## 2018-11-24 MED ORDER — METFORMIN HCL 500 MG PO TABS: ORAL_TABLET | ORAL | 0 refills | Status: DC

## 2018-11-25 ENCOUNTER — Other Ambulatory Visit: Payer: Self-pay | Admitting: Family Medicine

## 2018-12-05 ENCOUNTER — Encounter: Payer: Self-pay | Admitting: Internal Medicine

## 2018-12-17 ENCOUNTER — Other Ambulatory Visit: Payer: Self-pay | Admitting: Family Medicine

## 2019-01-05 ENCOUNTER — Other Ambulatory Visit: Payer: Self-pay | Admitting: *Deleted

## 2019-01-05 MED ORDER — FENOFIBRATE 160 MG PO TABS: 160.0000 mg | ORAL_TABLET | Freq: Every day | ORAL | 1 refills | Status: DC

## 2019-01-14 ENCOUNTER — Other Ambulatory Visit: Payer: Self-pay | Admitting: Family Medicine

## 2019-02-11 ENCOUNTER — Other Ambulatory Visit: Payer: Self-pay | Admitting: Family Medicine

## 2019-02-15 IMAGING — RF DG C-ARM 61-120 MIN
1 series · 2 of 2 positions shown · non-contrast
Comparison: 07/01/2018

CLINICAL DATA: PLIF L3-4

EXAM:
LUMBAR SPINE - 2-3 VIEW; DG C-ARM 61-120 MIN

[Series 1: run · 2 of 2 slices shown]
[im 1/2]
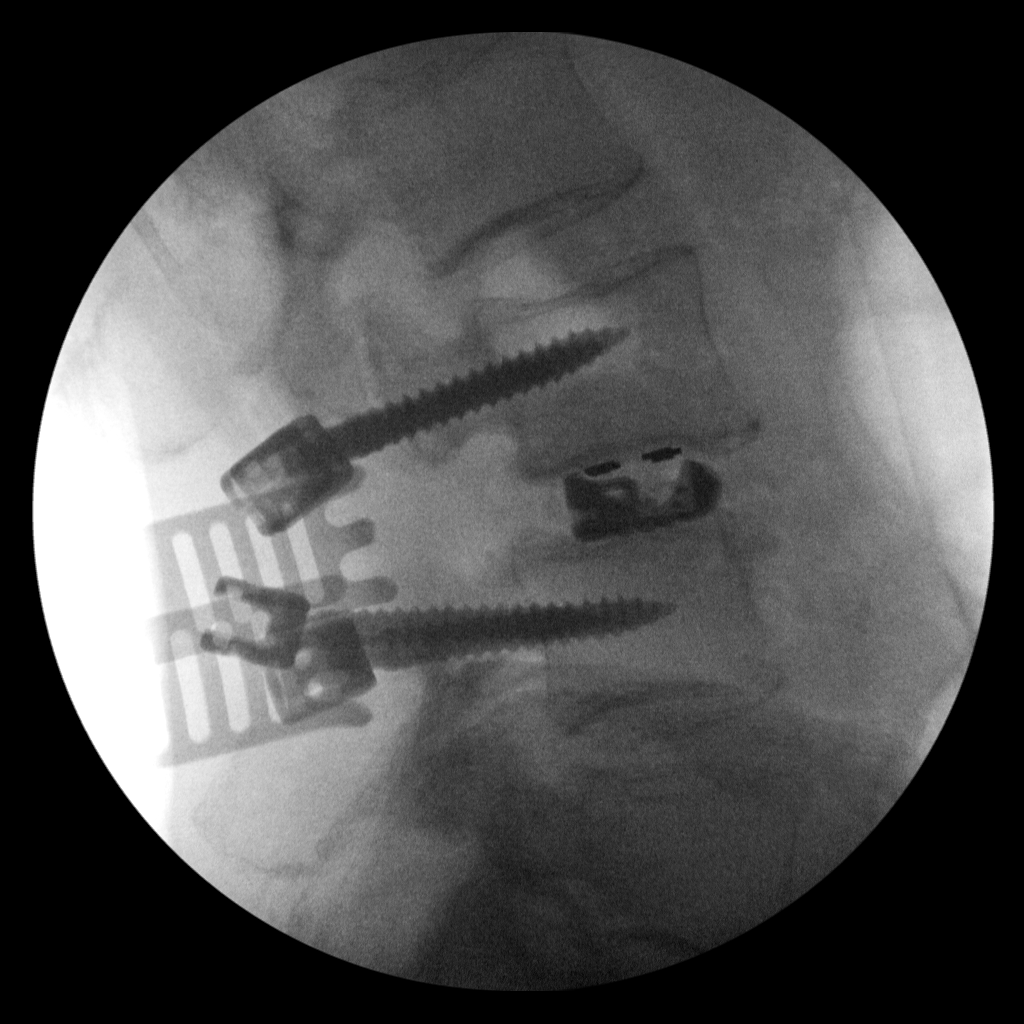
[im 2/2]
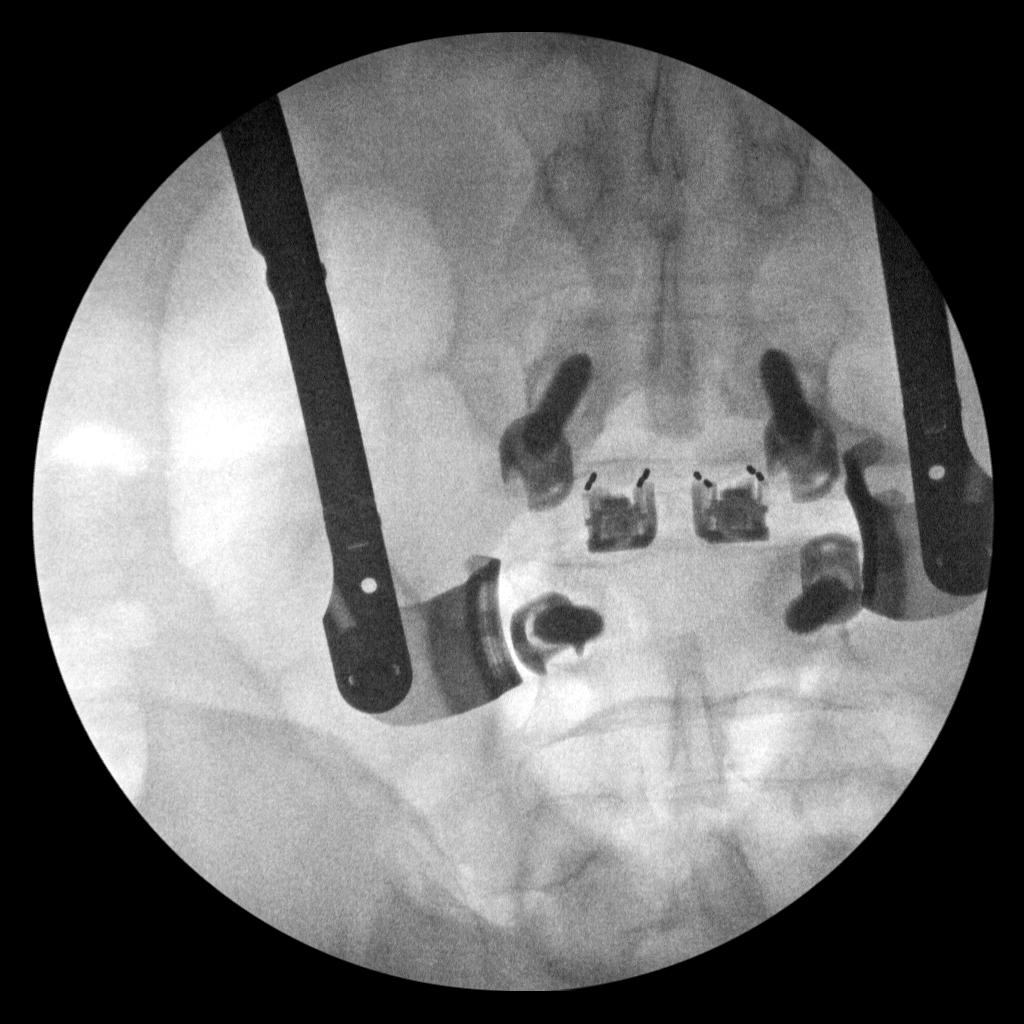

[2 of 2 positions shown; findings below may reference images not displayed]

FINDINGS: AP and lateral intraoperative images the lumbar spine. Bilateral
pedicle screw and interbody fusion in the lumbar spine. Level
appears to be L3-4.
IMPRESSION: PLIF L3-4.

## 2019-03-01 ENCOUNTER — Ambulatory Visit (AMBULATORY_SURGERY_CENTER): Payer: Self-pay

## 2019-03-01 ENCOUNTER — Other Ambulatory Visit: Payer: Self-pay

## 2019-03-01 VITALS — Ht 72.0 in | Wt 265.0 lb

## 2019-03-01 DIAGNOSIS — H401123 Primary open-angle glaucoma, left eye, severe stage: Secondary | ICD-10-CM | POA: Diagnosis not present

## 2019-03-01 DIAGNOSIS — Z86010 Personal history of colonic polyps: Secondary | ICD-10-CM

## 2019-03-01 MED ORDER — NA SULFATE-K SULFATE-MG SULF 17.5-3.13-1.6 GM/177ML PO SOLN: 1.0000 | Freq: Once | ORAL | 0 refills | Status: AC

## 2019-03-01 NOTE — Progress Notes (Signed)
Denies allergies to eggs or soy products. Denies complication of anesthesia or sedation. Denies use of weight loss medication. Denies use of O2.   Emmi instructions given for colonoscopy.   Pre-Visit was conducted by phone due to Covid 19. Instructionns were reviewed and mailed to patients confirmed home address. Patient was encouraged to call if he had any questions or concerns regarding instructions.

## 2019-03-09 ENCOUNTER — Telehealth: Payer: Self-pay | Admitting: Internal Medicine

## 2019-03-09 NOTE — Telephone Encounter (Signed)

## 2019-03-10 ENCOUNTER — Other Ambulatory Visit: Payer: Self-pay | Admitting: Family Medicine

## 2019-03-10 ENCOUNTER — Encounter: Payer: Self-pay | Admitting: Internal Medicine

## 2019-03-10 ENCOUNTER — Other Ambulatory Visit: Payer: Self-pay

## 2019-03-10 ENCOUNTER — Ambulatory Visit (AMBULATORY_SURGERY_CENTER): Payer: Medicare Other | Admitting: Internal Medicine

## 2019-03-10 VITALS — BP 130/72 | HR 69 | Temp 98.8°F | Resp 20 | Ht 72.0 in | Wt 277.0 lb

## 2019-03-10 DIAGNOSIS — D120 Benign neoplasm of cecum: Secondary | ICD-10-CM | POA: Diagnosis not present

## 2019-03-10 DIAGNOSIS — Z86010 Personal history of colonic polyps: Secondary | ICD-10-CM | POA: Diagnosis not present

## 2019-03-10 DIAGNOSIS — D123 Benign neoplasm of transverse colon: Secondary | ICD-10-CM

## 2019-03-10 MED ORDER — SODIUM CHLORIDE 0.9 % IV SOLN: 500.0000 mL | Freq: Once | INTRAVENOUS | Status: DC

## 2019-03-10 NOTE — Patient Instructions (Signed)
Impression/Recommendations:  Polyp handout given to patient. Diverticulosis handout given to patient. Hemorrhoid handout given to patient.  Resume previous diet. Continue present medications.  Await pathology results.  Repeat colonoscopy recommended for surveillance.  Date to be determined after pathology results reviewed.  YOU HAD AN ENDOSCOPIC PROCEDURE TODAY AT [facility]:   Refer to the procedure report that was given to you for any specific questions about what was found during the examination.  If the procedure report does not answer your questions, please call your gastroenterologist to clarify.  If you requested that your care partner not be given the details of your procedure findings, then the procedure report has been included in a sealed envelope for you to review at your convenience later.  YOU SHOULD EXPECT: Some feelings of bloating in the abdomen. Passage of more gas than usual.  Walking can help get rid of the air that was put into your GI tract during the procedure and reduce the bloating. If you had a lower endoscopy (such as a colonoscopy or flexible sigmoidoscopy) you may notice spotting of blood in your stool or on the toilet paper. If you underwent a bowel prep for your procedure, you may not have a normal bowel movement for a few days.  Please Note:  You might notice some irritation and congestion in your nose or some drainage.  This is from the oxygen used during your procedure.  There is no need for concern and it should clear up in a day or so.  SYMPTOMS TO REPORT IMMEDIATELY:   Following lower endoscopy (colonoscopy or flexible sigmoidoscopy):  Excessive amounts of blood in the stool  Significant tenderness or worsening of abdominal pains  Swelling of the abdomen that is new, acute  Fever of 100F or higher  For urgent or emergent issues, a gastroenterologist can be reached at any hour by calling [phone].   DIET:  We do recommend a  small meal at first, but then you may proceed to your regular diet.  Drink plenty of fluids but you should avoid alcoholic beverages for 24 hours.  ACTIVITY:  You should plan to take it easy for the rest of today and you should NOT DRIVE or use heavy machinery until tomorrow (because of the sedation medicines used during the test).    FOLLOW UP: Our staff will call the number listed on your records 48-72 hours following your procedure to check on you and address any questions or concerns that you may have regarding the information given to you following your procedure. If we do not reach you, we will leave a message.  We will attempt to reach you two times.  During this call, we will ask if you have developed any symptoms of COVID 19. If you develop any symptoms (ie: fever, flu-like symptoms, shortness of breath, cough etc.) before then, please call [phone].  If you test positive for Covid 19 in the 2 weeks post procedure, please call and report this information to [place].    If any biopsies were taken you will be contacted by phone or by letter within the next 1-3 weeks.  Please call us at [phone] if you have not heard about the biopsies in 3 weeks.    SIGNATURES/CONFIDENTIALITY: You and/or your care partner have signed paperwork which will be entered into your electronic medical record.  These signatures attest to the fact that that the information above on your After Visit Summary has been reviewed and is understood.  Full responsibility of the confidentiality of this discharge information lies with you and/or your care-partner.

## 2019-03-10 NOTE — Progress Notes (Signed)
PT taken to PACU. Monitors in place. VSS. Report given to RN. 

## 2019-03-10 NOTE — Op Note (Signed)
[facility] Patient Name: [name] Procedure Date: 03/10/2019 1:40 PM MRN: [id number] Endoscopist: Jerene Bears , MD Age: 68 Referring MD:  Date of Birth: 02/17/1951 Gender: Male Account #: [id number] Procedure:                Colonoscopy Indications:              High risk colon cancer surveillance: Personal                            history of multiple (>10) adenomas, Last                            colonoscopy 1 year ago Medicines:                Monitored Anesthesia Care Procedure:                Pre-Anesthesia Assessment:                           - Prior to the procedure, a History and Physical                            was performed, and patient medications and                            allergies were reviewed. The patient's tolerance of                            previous anesthesia was also reviewed. The risks                            and benefits of the procedure and the sedation                            options and risks were discussed with the patient.                            All questions were answered, and informed consent                            was obtained. Prior Anticoagulants: The patient has                            taken no previous anticoagulant or antiplatelet                            agents. ASA Grade Assessment: III - A patient with                            severe systemic disease. After reviewing the risks                            and benefits, the patient was deemed in  satisfactory condition to undergo the procedure.                           After obtaining informed consent, the colonoscope                            was passed under direct vision. Throughout the                            procedure, the patient's blood pressure, pulse, and                            oxygen saturations were monitored continuously. The                            Model CF-HQ190L [id number]) scope was introduced                             through the anus and advanced to the cecum,                            identified by appendiceal orifice and ileocecal                            valve. The colonoscopy was performed without                            difficulty. The patient tolerated the procedure                            well. The quality of the bowel preparation was                            good. The ileocecal valve, appendiceal orifice, and                            rectum were photographed. Scope In: 1:56:47 PM Scope Out: 2:16:09 PM Scope Withdrawal Time: 0 hours 14 minutes 44 seconds  Total Procedure Duration: 0 hours 19 minutes 22 seconds  Findings:                 The digital rectal exam was normal.                           Two sessile polyps were found in the cecum. The                            polyps were 2 to 6 mm in size. These polyps were                            removed with a cold forceps x 1 cold snare x 1.                            Resection and retrieval were complete.  Two sessile polyps were found in the transverse                            colon. The polyps were 4 to 5 mm in size. These                            polyps were removed with a cold snare. Resection                            and retrieval were complete.                           Multiple small and large-mouthed diverticula were                            found in the sigmoid colon, descending colon and                            ascending colon.                           Internal hemorrhoids were found during retroflexion. Complications:            No immediate complications. Estimated Blood Loss:     Estimated blood loss was minimal. Impression:               - Two 2 to 6 mm polyps in the cecum, removed with a                            cold forceps and cold snare. Resected and retrieved.                           - Two 4 to 5 mm polyps in the transverse colon,                             removed with a cold snare. Resected and retrieved.                           - Moderate diverticulosis in the sigmoid colon, in                            the descending colon and in the ascending colon.                           - Internal hemorrhoids. Recommendation:           - Patient has a contact number available for                            emergencies. The signs and symptoms of potential                            delayed complications were discussed with the  patient. Return to normal activities tomorrow.                            Written discharge instructions were provided to the                            patient.                           - Resume previous diet.                           - Continue present medications.                           - Await pathology results.                           - Repeat colonoscopy is recommended for                            surveillance. The colonoscopy date will be                            determined after pathology results from today's                            exam become available for review. Jerene Bears, MD 03/10/2019 2:20:42 PM This report has been signed electronically.

## 2019-03-10 NOTE — Progress Notes (Signed)
Temperature taken by [facility]  VS taken by Rica Mote

## 2019-03-10 NOTE — Progress Notes (Signed)
Called to room to assist during endoscopic procedure.  Patient ID and intended procedure confirmed with present staff. Received instructions for my participation in the procedure from the performing physician.  

## 2019-03-14 ENCOUNTER — Telehealth: Payer: Self-pay

## 2019-03-14 NOTE — Telephone Encounter (Signed)
First post procedure follow up call, no answer 

## 2019-03-14 NOTE — Telephone Encounter (Signed)
  Follow up Call-  Call back number 03/10/2019 12/02/2017  Post procedure Call Back phone  # [phone] cell  Permission to leave phone message Yes Yes  Some recent data might be hidden     Patient questions:  Do you have a fever, pain , or abdominal swelling? No. Pain Score  0 *  Have you tolerated food without any problems? Yes.    Have you been able to return to your normal activities? Yes.    Do you have any questions about your discharge instructions: Diet   No. Medications  No. Follow up visit  No.  Do you have questions or concerns about your Care? No.  Actions: * If pain score is 4 or above: No action needed, pain <4.  1. Have you developed a fever since your procedure? no  2.   Have you had an respiratory symptoms (SOB or cough) since your procedure? no  3.   Have you tested positive for COVID 19 since your procedure no  4.   Have you had any family members/close contacts diagnosed with the COVID 19 since your procedure?  no   If yes to any of these questions please route to Joylene John, RN and Alphonsa Gin, Therapist, sports.

## 2019-03-15 ENCOUNTER — Encounter: Payer: Self-pay | Admitting: Internal Medicine

## 2019-04-20 ENCOUNTER — Other Ambulatory Visit: Payer: Self-pay

## 2019-05-03 ENCOUNTER — Other Ambulatory Visit: Payer: Self-pay | Admitting: Family Medicine

## 2019-05-05 ENCOUNTER — Other Ambulatory Visit: Payer: Self-pay | Admitting: Family Medicine

## 2019-05-19 ENCOUNTER — Other Ambulatory Visit: Payer: Self-pay | Admitting: Family Medicine

## 2019-05-19 DIAGNOSIS — E785 Hyperlipidemia, unspecified: Secondary | ICD-10-CM

## 2019-06-06 ENCOUNTER — Other Ambulatory Visit: Payer: Self-pay | Admitting: Family Medicine

## 2019-06-20 ENCOUNTER — Other Ambulatory Visit: Payer: Self-pay | Admitting: Family Medicine

## 2019-07-12 ENCOUNTER — Other Ambulatory Visit: Payer: Self-pay | Admitting: Family Medicine

## 2019-07-21 ENCOUNTER — Other Ambulatory Visit: Payer: Self-pay

## 2019-07-22 ENCOUNTER — Ambulatory Visit (INDEPENDENT_AMBULATORY_CARE_PROVIDER_SITE_OTHER): Payer: Medicare Other | Admitting: Family Medicine

## 2019-07-22 ENCOUNTER — Encounter: Payer: Self-pay | Admitting: Family Medicine

## 2019-07-22 VITALS — BP 134/82 | HR 80 | Temp 97.9°F | Resp 16 | Ht 72.0 in | Wt 271.0 lb

## 2019-07-22 DIAGNOSIS — Z125 Encounter for screening for malignant neoplasm of prostate: Secondary | ICD-10-CM | POA: Diagnosis not present

## 2019-07-22 DIAGNOSIS — Z23 Encounter for immunization: Secondary | ICD-10-CM | POA: Diagnosis not present

## 2019-07-22 DIAGNOSIS — R7303 Prediabetes: Secondary | ICD-10-CM

## 2019-07-22 DIAGNOSIS — E7800 Pure hypercholesterolemia, unspecified: Secondary | ICD-10-CM | POA: Diagnosis not present

## 2019-07-22 DIAGNOSIS — F17200 Nicotine dependence, unspecified, uncomplicated: Secondary | ICD-10-CM | POA: Diagnosis not present

## 2019-07-22 DIAGNOSIS — N486 Induration penis plastica: Secondary | ICD-10-CM

## 2019-07-22 DIAGNOSIS — Z0001 Encounter for general adult medical examination with abnormal findings: Secondary | ICD-10-CM

## 2019-07-22 DIAGNOSIS — I10 Essential (primary) hypertension: Secondary | ICD-10-CM

## 2019-07-22 DIAGNOSIS — Z0000 Encounter for general adult medical examination without abnormal findings: Secondary | ICD-10-CM

## 2019-07-22 NOTE — Addendum Note (Signed)
Addended by: Shary Decamp B on: 07/22/2019 12:38 PM   Modules accepted: Orders

## 2019-07-22 NOTE — Progress Notes (Signed)
Subjective:    Patient ID: [name], male    DOB: 02-21-1951, 68 y.o.   MRN: [id number]  Medication Refill    Patient is a very pleasant 68 year old African-American male here today for complete physical exam.  He continues to gain weight.  He admits to being sedentary.  He states that the majority of the day he spends sitting in a chair.  He is retired.  The other reason he spends most of his day sitting is due to pain in his left knee.  He saw urology in 2019 secondary to Peyronie's disease.  He states that this is getting worse.  He would like to go back to see the urologist regarding this.  He had a colonoscopy this year in June which showed several tubular adenomas.  His gastroenterologist recommended a repeat colonoscopy in 2023.  He is due for a booster on Pneumovax 23 next year.  He had Prevnar 13 last year.  He is due for a flu shot today which he received.  He also has a history of borderline diabetes mellitus type 2.  He is due today to recheck hemoglobin A1c.  His blood pressure is well controlled at 134/82.  He denies any chest pain shortness of breath or dyspnea on exertion.  He is due to check a fasting lipid panel for his hyperlipidemia.  Unfortunately he has resumed smoking.  I strongly recommended smoking cessation.  Past Medical History:  Diagnosis Date  . Bladder neck obstruction   . Colon polyp   . Diabetes mellitus without complication ([place])   . GERD (gastroesophageal reflux disease)   . Glaucoma   . Hyperlipidemia   . Hypertension   . Prediabetes   . Prosthetic eye globe    Right eye  . Sleep apnea    does not use machine per pt.  Paulla Fore    Past Surgical History:  Procedure Laterality Date  . arthroscopic knee surgery    . BACK SURGERY     lumbar decompression and fusion (Dr. Annette Stable) 2019  . COLONOSCOPY     DP  . ELBOW SURGERY    . fractured radius surgery    . POLYPECTOMY    . right knee tka    . TONSILLECTOMY     removed as child unsure of age   . TOTAL KNEE ARTHROPLASTY    . traumatic eye enucleation  2011   right   Current Outpatient Medications on File Prior to Visit  Medication Sig Dispense Refill  . amLODipine (NORVASC) 5 MG tablet TAKE 1 TABLET BY MOUTH EVERY DAY 90 tablet 3  . cyclobenzaprine (FLEXERIL) 10 MG tablet Take 1 tablet (10 mg total) by mouth 3 (three) times daily as needed for muscle spasms. 30 tablet 0  . fenofibrate 160 MG tablet Take 1 tablet (160 mg total) by mouth daily. 90 tablet 1  . latanoprost (XALATAN) 0.005 % ophthalmic solution Place 1 drop into the left eye at bedtime.   11  . lisinopril-hydrochlorothiazide (ZESTORETIC) 20-25 MG tablet TAKE 1 TABLET BY MOUTH EVERY DAY 90 tablet 2  . metFORMIN (GLUCOPHAGE) 500 MG tablet TAKE 1 TABLET BY MOUTH TWICE A DAY WITH A MEAL NEEDS OFFICE VISIT AND LABS BEFORE FURTHER REFILLS 60 tablet 0  . omeprazole (PRILOSEC) 20 MG capsule Take 20 mg by mouth daily.    . pravastatin (PRAVACHOL) 40 MG tablet TAKE 1 TABLET BY MOUTH EVERY DAY 90 tablet 1   Current Facility-Administered Medications on File Prior to  Visit  Medication Dose Route Frequency Provider Last Rate Last Dose  . 0.9 %  sodium chloride infusion  500 mL Intravenous Once Pyrtle, Lajuan Lines, MD       No Known Allergies Social History   Socioeconomic History  . Marital status: Divorced    Spouse name: Not on file  . Number of children: Not on file  . Years of education: Not on file  . Highest education level: Not on file  Occupational History  . Not on file  Social Needs  . Financial resource strain: Not on file  . Food insecurity    Worry: Not on file    Inability: Not on file  . Transportation needs    Medical: Not on file    Non-medical: Not on file  Tobacco Use  . Smoking status: Current Some Day Smoker    Packs/day: 0.25    Types: Cigarettes  . Smokeless tobacco: Never Used  . Tobacco comment:  approm. 2 cigs a day since 09/2017  Substance and Sexual Activity  . Alcohol use: Yes    Comment:  weekends beer= 3-4   . Drug use: No  . Sexual activity: Yes  Lifestyle  . Physical activity    Days per week: Not on file    Minutes per session: Not on file  . Stress: Not on file  Relationships  . Social Herbalist on phone: Not on file    Gets together: Not on file    Attends religious service: Not on file    Active member of club or organization: Not on file    Attends meetings of clubs or organizations: Not on file    Relationship status: Not on file  . Intimate partner violence    Fear of current or ex partner: Not on file    Emotionally abused: Not on file    Physically abused: Not on file    Forced sexual activity: Not on file  Other Topics Concern  . Not on file  Social History Narrative  . Not on file      Review of Systems  All other systems reviewed and are negative.      Objective:   Physical Exam  Constitutional: He is oriented to person, place, and time. He appears well-developed and well-nourished.  HENT:  Head: Normocephalic and atraumatic.  Right Ear: External ear normal.  Left Ear: External ear normal.  Nose: Nose normal.  Mouth/Throat: Oropharynx is clear and moist. No oropharyngeal exudate.  Eyes: Conjunctivae are normal.    Neck: Neck supple. No JVD present. No tracheal deviation present. No thyromegaly present.  Cardiovascular: Normal rate, regular rhythm, normal heart sounds and intact distal pulses.  No murmur heard. Pulmonary/Chest: Effort normal and breath sounds normal. No stridor. No respiratory distress. He has no wheezes. He has no rales.  Abdominal: Soft. Bowel sounds are normal. He exhibits no distension and no mass. There is no abdominal tenderness. There is no rebound and no guarding.  Musculoskeletal:        General: No edema.  Lymphadenopathy:    He has no cervical adenopathy.  Neurological: He is alert and oriented to person, place, and time. He has normal reflexes. He displays normal reflexes. No cranial nerve  deficit. He exhibits normal muscle tone. Coordination normal.  Psychiatric: He has a normal mood and affect. His behavior is normal. Judgment and thought content normal.  Vitals reviewed.         Assessment &  Plan:  Benign essential HTN - Plan: PSA, Hemoglobin A1c, Lipid panel, COMPLETE METABOLIC PANEL WITH GFR, CBC with Differential/Platelet  Pure hypercholesterolemia - Plan: PSA, Hemoglobin A1c, Lipid panel, COMPLETE METABOLIC PANEL WITH GFR, CBC with Differential/Platelet  Prediabetes - Plan: PSA, Hemoglobin A1c, Lipid panel, COMPLETE METABOLIC PANEL WITH GFR, CBC with Differential/Platelet  Peyronie disease - Plan: Ambulatory referral to Urology  General medical exam  Smoker  Strongly recommended smoking cessation.  Patient's blood pressure today is well controlled.  Check a CMP and a fasting lipid panel.  Goal LDL cholesterol is less than 100 due to his diabetes.  Check hemoglobin A1c.  Goal hemoglobin A1c is less than 6.5.  Recommended the patient try to get 30 minutes a day 5 days a week of aerobic exercise regarding his blood sugar as well as his deconditioning.  Colonoscopy is up-to-date.  Immunizations are up-to-date except for his flu shot.  Patient received his flu shot today.  We also discussed the tetanus vaccine which he defers as well as Shingrix which I recommended if his insurance will cover.  He can check on this at his local pharmacy.  Colonoscopy is not due again until 2023.  I will consult urology regarding his Peyronie's disease.  I will screen for prostate cancer with a PSA.

## 2019-07-23 LAB — COMPLETE METABOLIC PANEL WITH GFR
AG Ratio: 1.6 (calc) (ref 1.0–2.5)
ALT: 22 U/L (ref 9–46)
AST: 16 U/L (ref 10–35)
Albumin: 4.5 g/dL (ref 3.6–5.1)
Alkaline phosphatase (APISO): 32 U/L — ABNORMAL LOW (ref 35–144)
BUN: 17 mg/dL (ref 7–25)
CO2: 25 mmol/L (ref 20–32)
Calcium: 10 mg/dL (ref 8.6–10.3)
Chloride: 102 mmol/L (ref 98–110)
Creat: 0.92 mg/dL (ref 0.70–1.25)
GFR, Est African American: 99 mL/min/{1.73_m2} (ref >=60)
GFR, Est Non African American: 86 mL/min/{1.73_m2} (ref >=60)
Globulin: 2.9 g/dL (calc) (ref 1.9–3.7)
Glucose, Bld: 117 mg/dL — ABNORMAL HIGH (ref 65–99)
Potassium: 4.2 mmol/L (ref 3.5–5.3)
Sodium: 138 mmol/L (ref 135–146)
Total Bilirubin: 0.4 mg/dL (ref 0.2–1.2)
Total Protein: 7.4 g/dL (ref 6.1–8.1)

## 2019-07-23 LAB — HEMOGLOBIN A1C
Hgb A1c MFr Bld: 6.5 % of total Hgb — ABNORMAL HIGH (ref <5.7)
Mean Plasma Glucose: 140 (calc)
eAG (mmol/L): 7.7 (calc)

## 2019-07-23 LAB — CBC WITH DIFFERENTIAL/PLATELET
Absolute Monocytes: 605 cells/uL (ref 200–950)
Basophils Absolute: 50 cells/uL (ref 0–200)
Basophils Relative: 0.8 %
Eosinophils Absolute: 302 cells/uL (ref 15–500)
Eosinophils Relative: 4.8 %
HCT: 39.2 % (ref 38.5–50.0)
Hemoglobin: 13.8 g/dL (ref 13.2–17.1)
Lymphs Abs: 2426 cells/uL (ref 850–3900)
MCH: 31.7 pg (ref 27.0–33.0)
MCHC: 35.2 g/dL (ref 32.0–36.0)
MCV: 89.9 fL (ref 80.0–100.0)
MPV: 10.3 fL (ref 7.5–12.5)
Monocytes Relative: 9.6 %
Neutro Abs: 2917 cells/uL (ref 1500–7800)
Neutrophils Relative %: 46.3 %
Platelets: 272 10*3/uL (ref 140–400)
RBC: 4.36 10*6/uL (ref 4.20–5.80)
RDW: 12.8 % (ref 11.0–15.0)
Total Lymphocyte: 38.5 %
WBC: 6.3 10*3/uL (ref 3.8–10.8)

## 2019-07-23 LAB — LIPID PANEL
Cholesterol: 220 mg/dL — ABNORMAL HIGH (ref <200)
HDL: 40 mg/dL (ref >=40)
Non-HDL Cholesterol (Calc): 180 mg/dL (calc) — ABNORMAL HIGH (ref <130)
Total CHOL/HDL Ratio: 5.5 (calc) — ABNORMAL HIGH (ref <5.0)
Triglycerides: 632 mg/dL — ABNORMAL HIGH (ref <150)

## 2019-07-23 LAB — PSA: PSA: 1.5 ng/mL (ref <=4.0)

## 2019-07-26 ENCOUNTER — Other Ambulatory Visit: Payer: Self-pay | Admitting: Family Medicine

## 2019-07-26 DIAGNOSIS — E785 Hyperlipidemia, unspecified: Secondary | ICD-10-CM

## 2019-07-26 MED ORDER — AMLODIPINE BESYLATE 5 MG PO TABS: 5.0000 mg | ORAL_TABLET | Freq: Every day | ORAL | 3 refills | Status: DC

## 2019-07-26 MED ORDER — LISINOPRIL-HYDROCHLOROTHIAZIDE 20-25 MG PO TABS: 1.0000 | ORAL_TABLET | Freq: Every day | ORAL | 2 refills | Status: DC

## 2019-07-26 MED ORDER — PRAVASTATIN SODIUM 40 MG PO TABS: 40.0000 mg | ORAL_TABLET | Freq: Every day | ORAL | 1 refills | Status: DC

## 2019-07-26 MED ORDER — FENOFIBRATE 160 MG PO TABS: 160.0000 mg | ORAL_TABLET | Freq: Every day | ORAL | 1 refills | Status: DC

## 2019-08-20 ENCOUNTER — Other Ambulatory Visit: Payer: Self-pay | Admitting: Family Medicine

## 2019-09-06 DIAGNOSIS — H401124 Primary open-angle glaucoma, left eye, indeterminate stage: Secondary | ICD-10-CM | POA: Diagnosis not present

## 2019-09-06 DIAGNOSIS — H2512 Age-related nuclear cataract, left eye: Secondary | ICD-10-CM | POA: Diagnosis not present

## 2019-09-18 ENCOUNTER — Other Ambulatory Visit: Payer: Self-pay | Admitting: Family Medicine

## 2019-10-11 ENCOUNTER — Other Ambulatory Visit: Payer: Self-pay | Admitting: Family Medicine

## 2019-10-27 ENCOUNTER — Ambulatory Visit: Payer: Medicare Other | Attending: Internal Medicine

## 2019-10-27 DIAGNOSIS — Z23 Encounter for immunization: Secondary | ICD-10-CM

## 2019-10-27 NOTE — Progress Notes (Signed)
   Covid-19 Vaccination Clinic  Name:  CION BENZING    MRN: [id number] DOB: [birth year]  10/27/2019  Mr. Sharman was observed post Covid-19 immunization for 15 minutes without incidence. He was provided with Vaccine Information Sheet and instruction to access the V-Safe system.   Mr. Rigo was instructed to call 911 with any severe reactions post vaccine: Marland Kitchen Difficulty breathing  . Swelling of your face and throat  . A fast heartbeat  . A bad rash all over your body  . Dizziness and weakness    Immunizations Administered    Name Date Dose VIS Date Route   Pfizer COVID-19 Vaccine 10/27/2019 10:02 AM 0.3 mL 09/02/2019 Intramuscular   Manufacturer: [place]   Lot: [id number]   [place]: [id number]

## 2019-11-21 ENCOUNTER — Ambulatory Visit: Payer: Medicare Other | Attending: Internal Medicine

## 2019-11-21 DIAGNOSIS — Z23 Encounter for immunization: Secondary | ICD-10-CM

## 2019-11-21 NOTE — Progress Notes (Signed)
   Covid-19 Vaccination Clinic  Name:  BURCHELL GONYA    MRN: [id number] DOB: 1951/07/21  11/21/2019  Mr. Dorschner was observed post Covid-19 immunization for 15 minutes without incidence. He was provided with Vaccine Information Sheet and instruction to access the V-Safe system.   Mr. Novey was instructed to call 911 with any severe reactions post vaccine: Marland Kitchen Difficulty breathing  . Swelling of your face and throat  . A fast heartbeat  . A bad rash all over your body  . Dizziness and weakness    Immunizations Administered    Name Date Dose VIS Date Route   Pfizer COVID-19 Vaccine 11/21/2019  9:54 AM 0.3 mL 09/02/2019 Intramuscular   Manufacturer: [place]   Lot: [id number]   [place]: [id number]

## 2019-12-06 DIAGNOSIS — N486 Induration penis plastica: Secondary | ICD-10-CM | POA: Diagnosis not present

## 2019-12-08 ENCOUNTER — Other Ambulatory Visit: Payer: Self-pay | Admitting: Urology

## 2019-12-15 ENCOUNTER — Encounter (HOSPITAL_BASED_OUTPATIENT_CLINIC_OR_DEPARTMENT_OTHER): Payer: Self-pay | Admitting: Urology

## 2019-12-15 ENCOUNTER — Other Ambulatory Visit: Payer: Self-pay

## 2019-12-15 NOTE — Progress Notes (Signed)
Spoke with: Radin NPO:  After Midnight, no gum, candy, or mints   Arrival time: 0800 AM Lab needs dos---- ISTAT 8, EKG           Lab results------N/A COVID test ------12/17/19 at 9:35 AM Medications to take morning of surgery: Amlodipine, Omeprazole Pre op orders in epic: Yes Diabetic medication -----None Patient Special Instructions -----N/A Pre-Op special Istructions -----N/A Ride home: Will call us back once he has confirmed his ride home  Patient verbalized understanding of instructions that were given at this phone interview. Patient denies shortness of breath, chest pain, fever, cough a this phone interview.

## 2019-12-17 ENCOUNTER — Other Ambulatory Visit (HOSPITAL_COMMUNITY)
Admission: RE | Admit: 2019-12-17 | Discharge: 2019-12-17 | Disposition: A | Discharge status: Home / Self Care | Payer: Medicare Other | Source: Ambulatory Visit | Attending: Urology | Admitting: Urology

## 2019-12-17 DIAGNOSIS — Z20822 Contact with and (suspected) exposure to covid-19: Secondary | ICD-10-CM | POA: Diagnosis not present

## 2019-12-17 DIAGNOSIS — Z01812 Encounter for preprocedural laboratory examination: Secondary | ICD-10-CM | POA: Diagnosis not present

## 2019-12-17 LAB — SARS CORONAVIRUS 2 (TAT 6-24 HRS): SARS Coronavirus 2: NEGATIVE

## 2019-12-20 NOTE — Progress Notes (Signed)
Patient called and son levi Spranger will be driver for day of surgery

## 2019-12-20 NOTE — Anesthesia Preprocedure Evaluation (Addendum)
Anesthesia Evaluation  Patient identified by MRN, date of birth, ID band Patient awake    Reviewed: Allergy & Precautions, NPO status , Patient's Chart, lab work & pertinent test results  Airway Mallampati: II  TM Distance: >3 FB Neck ROM: Full    Dental no notable dental hx. (+) Teeth Intact, Dental Advisory Given   Pulmonary sleep apnea , Patient abstained from smoking., former smoker,    Pulmonary exam normal breath sounds clear to auscultation       Cardiovascular hypertension, Pt. on medications Normal cardiovascular exam Rhythm:Regular Rate:Normal  EKG SR R 92    Neuro/Psych negative neurological ROS  negative psych ROS   GI/Hepatic Neg liver ROS, hiatal hernia, GERD  Medicated,  Endo/Other  diabetes, Type 2  Renal/GU K+ 3.6     Musculoskeletal   Abdominal   Peds  Hematology Hgb 13.6   Anesthesia Other Findings   Reproductive/Obstetrics                            Anesthesia Physical Anesthesia Plan  ASA: III  Anesthesia Plan: General   Post-op Pain Management:    Induction: Intravenous  PONV Risk Score and Plan: 3 and Treatment may vary due to age or medical condition, Ondansetron and Dexamethasone  Airway Management Planned: LMA  Additional Equipment: None  Intra-op Plan:   Post-operative Plan: Extubation in OR  Informed Consent: I have reviewed the patients History and Physical, chart, labs and discussed the procedure including the risks, benefits and alternatives for the proposed anesthesia with the patient or authorized representative who has indicated his/her understanding and acceptance.     Dental advisory given  Plan Discussed with:   Anesthesia Plan Comments:         Anesthesia Quick Evaluation

## 2019-12-21 ENCOUNTER — Ambulatory Visit (HOSPITAL_COMMUNITY)
Admission: RE | Admit: 2019-12-21 | Discharge: 2019-12-21 | Disposition: A | Discharge status: Home / Self Care | Payer: Medicare Other | Source: Ambulatory Visit | Attending: Urology | Admitting: Urology

## 2019-12-21 ENCOUNTER — Ambulatory Visit (HOSPITAL_BASED_OUTPATIENT_CLINIC_OR_DEPARTMENT_OTHER): Payer: Medicare Other | Admitting: Anesthesiology

## 2019-12-21 ENCOUNTER — Encounter (HOSPITAL_BASED_OUTPATIENT_CLINIC_OR_DEPARTMENT_OTHER)
Admission: RE | Disposition: A | Discharge status: Home / Self Care | Payer: Self-pay | Source: Ambulatory Visit | Attending: Urology

## 2019-12-21 ENCOUNTER — Encounter (HOSPITAL_BASED_OUTPATIENT_CLINIC_OR_DEPARTMENT_OTHER): Payer: Self-pay | Admitting: Urology

## 2019-12-21 DIAGNOSIS — M1990 Unspecified osteoarthritis, unspecified site: Secondary | ICD-10-CM | POA: Diagnosis not present

## 2019-12-21 DIAGNOSIS — G4730 Sleep apnea, unspecified: Secondary | ICD-10-CM | POA: Insufficient documentation

## 2019-12-21 DIAGNOSIS — I10 Essential (primary) hypertension: Secondary | ICD-10-CM | POA: Diagnosis not present

## 2019-12-21 DIAGNOSIS — Z6836 Body mass index (BMI) 36.0-36.9, adult: Secondary | ICD-10-CM | POA: Insufficient documentation

## 2019-12-21 DIAGNOSIS — Z96651 Presence of right artificial knee joint: Secondary | ICD-10-CM | POA: Insufficient documentation

## 2019-12-21 DIAGNOSIS — E119 Type 2 diabetes mellitus without complications: Secondary | ICD-10-CM | POA: Insufficient documentation

## 2019-12-21 DIAGNOSIS — E669 Obesity, unspecified: Secondary | ICD-10-CM | POA: Insufficient documentation

## 2019-12-21 DIAGNOSIS — N486 Induration penis plastica: Secondary | ICD-10-CM | POA: Insufficient documentation

## 2019-12-21 DIAGNOSIS — E785 Hyperlipidemia, unspecified: Secondary | ICD-10-CM | POA: Diagnosis not present

## 2019-12-21 DIAGNOSIS — K219 Gastro-esophageal reflux disease without esophagitis: Secondary | ICD-10-CM | POA: Diagnosis not present

## 2019-12-21 DIAGNOSIS — Z970 Presence of artificial eye: Secondary | ICD-10-CM | POA: Insufficient documentation

## 2019-12-21 DIAGNOSIS — Z87891 Personal history of nicotine dependence: Secondary | ICD-10-CM | POA: Diagnosis not present

## 2019-12-21 HISTORY — DX: Presence of spectacles and contact lenses: Z97.3

## 2019-12-21 HISTORY — DX: Barrett's esophagus without dysplasia: K22.70

## 2019-12-21 HISTORY — PX: NESBIT PROCEDURE: SHX2087

## 2019-12-21 HISTORY — DX: Personal history of other diseases of the digestive system: Z87.19

## 2019-12-21 HISTORY — DX: Induration penis plastica: N48.6

## 2019-12-21 HISTORY — DX: Esophageal obstruction: K22.2

## 2019-12-21 HISTORY — DX: Unspecified osteoarthritis, unspecified site: M19.90

## 2019-12-21 LAB — POCT I-STAT, CHEM 8
BUN: 20 mg/dL (ref 8–23)
Calcium, Ion: 1.17 mmol/L (ref 1.15–1.40)
Chloride: 102 mmol/L (ref 98–111)
Creatinine, Ser: 1 mg/dL (ref 0.61–1.24)
Glucose, Bld: 118 mg/dL — ABNORMAL HIGH (ref 70–99)
HCT: 40 % (ref 39.0–52.0)
Hemoglobin: 13.6 g/dL (ref 13.0–17.0)
Potassium: 3.6 mmol/L (ref 3.5–5.1)
Sodium: 139 mmol/L (ref 135–145)
TCO2: 28 mmol/L (ref 22–32)

## 2019-12-21 SURGERY — NESBIT PROCEDURE
Anesthesia: General | Site: Penis

## 2019-12-21 MED ORDER — MIDAZOLAM HCL 5 MG/5ML IJ SOLN
INTRAMUSCULAR | Status: DC | PRN
  Administered 2019-12-21: 2 mg via INTRAVENOUS

## 2019-12-21 MED ORDER — FENTANYL CITRATE (PF) 100 MCG/2ML IJ SOLN
INTRAMUSCULAR | Status: AC
  Filled 2019-12-21: qty 2

## 2019-12-21 MED ORDER — CLINDAMYCIN PHOSPHATE 600 MG/50ML IV SOLN
INTRAVENOUS | Status: AC
  Filled 2019-12-21: qty 50

## 2019-12-21 MED ORDER — BUPIVACAINE HCL (PF) 0.25 % IJ SOLN
INTRAMUSCULAR | Status: DC | PRN
  Administered 2019-12-21: 20 mL

## 2019-12-21 MED ORDER — LACTATED RINGERS IV SOLN
INTRAVENOUS | Status: DC
  Filled 2019-12-21: qty 1000

## 2019-12-21 MED ORDER — MIDAZOLAM HCL 2 MG/2ML IJ SOLN
INTRAMUSCULAR | Status: AC
  Filled 2019-12-21: qty 2

## 2019-12-21 MED ORDER — SENNOSIDES-DOCUSATE SODIUM 8.6-50 MG PO TABS: 1.0000 | ORAL_TABLET | Freq: Two times a day (BID) | ORAL | 0 refills | Status: DC

## 2019-12-21 MED ORDER — PHENYLEPHRINE 40 MCG/ML (10ML) SYRINGE FOR IV PUSH (FOR BLOOD PRESSURE SUPPORT)
PREFILLED_SYRINGE | INTRAVENOUS | Status: AC
  Filled 2019-12-21: qty 10

## 2019-12-21 MED ORDER — LACTATED RINGERS IV SOLN: INTRAVENOUS | Status: DC | PRN

## 2019-12-21 MED ORDER — LIDOCAINE 2% (20 MG/ML) 5 ML SYRINGE
INTRAMUSCULAR | Status: DC | PRN
  Administered 2019-12-21: 100 mg via INTRAVENOUS

## 2019-12-21 MED ORDER — ONDANSETRON HCL 4 MG/2ML IJ SOLN
INTRAMUSCULAR | Status: DC | PRN
  Administered 2019-12-21: 4 mg via INTRAVENOUS

## 2019-12-21 MED ORDER — PROPOFOL 10 MG/ML IV BOLUS
INTRAVENOUS | Status: AC
  Filled 2019-12-21: qty 20

## 2019-12-21 MED ORDER — OXYCODONE-ACETAMINOPHEN 5-325 MG PO TABS: 1.0000 | ORAL_TABLET | Freq: Three times a day (TID) | ORAL | 0 refills | Status: DC | PRN

## 2019-12-21 MED ORDER — CLINDAMYCIN PHOSPHATE 600 MG/50ML IV SOLN
600.0000 mg | INTRAVENOUS | Status: AC
  Administered 2019-12-21: 600 mg via INTRAVENOUS
  Filled 2019-12-21: qty 50

## 2019-12-21 MED ORDER — FENTANYL CITRATE (PF) 100 MCG/2ML IJ SOLN
INTRAMUSCULAR | Status: DC | PRN
  Administered 2019-12-21 (×4): 50 ug via INTRAVENOUS

## 2019-12-21 MED ORDER — PHENYLEPHRINE 40 MCG/ML (10ML) SYRINGE FOR IV PUSH (FOR BLOOD PRESSURE SUPPORT)
PREFILLED_SYRINGE | INTRAVENOUS | Status: DC | PRN
  Administered 2019-12-21 (×6): 80 ug via INTRAVENOUS

## 2019-12-21 MED ORDER — SODIUM CHLORIDE 0.9 % IV SOLN
INTRAVENOUS | Status: AC | PRN
  Administered 2019-12-21: 250 mL via INTRAMUSCULAR

## 2019-12-21 MED ORDER — PROPOFOL 10 MG/ML IV BOLUS
INTRAVENOUS | Status: DC | PRN
  Administered 2019-12-21: 200 mg via INTRAVENOUS

## 2019-12-21 MED ORDER — DEXAMETHASONE SODIUM PHOSPHATE 10 MG/ML IJ SOLN
INTRAMUSCULAR | Status: DC | PRN
  Administered 2019-12-21: 4 mg via INTRAVENOUS

## 2019-12-21 SURGICAL SUPPLY — 57 items
BLADE CLIPPER SENSICLIP SURGIC (BLADE) ×3 IMPLANT
BLADE HEX COATED 2.75 (ELECTRODE) ×3 IMPLANT
BLADE SURG 15 STRL LF DISP TIS (BLADE) ×1 IMPLANT
BLADE SURG 15 STRL SS (BLADE) ×3
BNDG COHESIVE 1X5 TAN STRL LF (GAUZE/BANDAGES/DRESSINGS) IMPLANT
BNDG COHESIVE 2X5 TAN STRL LF (GAUZE/BANDAGES/DRESSINGS) ×3 IMPLANT
BNDG COHESIVE 3X5 TAN STRL LF (GAUZE/BANDAGES/DRESSINGS) IMPLANT
BNDG CONFORM 2 STRL LF (GAUZE/BANDAGES/DRESSINGS) ×3 IMPLANT
BNDG CONFORM 3 STRL LF (GAUZE/BANDAGES/DRESSINGS) IMPLANT
CANISTER SUCT 1200ML W/VALVE (MISCELLANEOUS) IMPLANT
CANISTER SUCT 3000ML PPV (MISCELLANEOUS) ×3 IMPLANT
CATH FOLEY 2WAY SLVR  5CC 16FR (CATHETERS) ×3
CATH FOLEY 2WAY SLVR 5CC 16FR (CATHETERS) ×1 IMPLANT
CATH ROBINSON RED A/P 10FR (CATHETERS) ×3 IMPLANT
COVER BACK TABLE 60X90IN (DRAPES) ×3 IMPLANT
COVER MAYO STAND STRL (DRAPES) ×3 IMPLANT
COVER WAND RF STERILE (DRAPES) ×3 IMPLANT
DRAIN PENROSE 0.25X18 (DRAIN) ×3 IMPLANT
DRAIN PENROSE 18X1/2 LTX STRL (DRAIN) IMPLANT
DRAPE LAPAROTOMY 100X72 PEDS (DRAPES) ×3 IMPLANT
ELECT BLADE 6.5 .24CM SHAFT (ELECTRODE) ×3 IMPLANT
ELECT REM PT RETURN 9FT ADLT (ELECTROSURGICAL) ×3
ELECTRODE REM PT RTRN 9FT ADLT (ELECTROSURGICAL) ×1 IMPLANT
GAUZE SPONGE 4X4 12PLY STRL (GAUZE/BANDAGES/DRESSINGS) IMPLANT
GAUZE XEROFORM 1X8 LF (GAUZE/BANDAGES/DRESSINGS) ×3 IMPLANT
GLOVE BIO SURGEON STRL SZ7.5 (GLOVE) ×3 IMPLANT
GOWN STRL REUS W/TWL LRG LVL3 (GOWN DISPOSABLE) ×3 IMPLANT
IV NS 250ML (IV SOLUTION) ×3
IV NS 250ML BAXH (IV SOLUTION) ×1 IMPLANT
IV NS IRRIG 3000ML ARTHROMATIC (IV SOLUTION) IMPLANT
KIT TURNOVER CYSTO (KITS) ×3 IMPLANT
NDL SAFETY ECLIPSE 18X1.5 (NEEDLE) IMPLANT
NEEDLE HYPO 18GX1.5 SHARP (NEEDLE)
NEEDLE HYPO 22GX1.5 SAFETY (NEEDLE) ×3 IMPLANT
NS IRRIG 500ML POUR BTL (IV SOLUTION) ×3 IMPLANT
PACK BASIN DAY SURGERY FS (CUSTOM PROCEDURE TRAY) ×3 IMPLANT
PENCIL BUTTON HOLSTER BLD 10FT (ELECTRODE) ×3 IMPLANT
PLUG CATH AND CAP STER (CATHETERS) ×3 IMPLANT
SET COLLECT BLD 25X3/4 12 (NEEDLE) ×3 IMPLANT
SPONGE LAP 4X18 RFD (DISPOSABLE) IMPLANT
SUPPORT SCROTAL LG STRP (MISCELLANEOUS) IMPLANT
SUPPORTER ATHLETIC LG (MISCELLANEOUS)
SUT CHROMIC 3 0 SH 27 (SUTURE) ×3 IMPLANT
SUT ETHIBOND 2 OS 4 DA (SUTURE) ×18 IMPLANT
SUT ETHIBOND 3-0 V-5 (SUTURE) IMPLANT
SUT VIC AB 3-0 SH 27 (SUTURE) ×6
SUT VIC AB 3-0 SH 27X BRD (SUTURE) IMPLANT
SUT VIC AB 3-0 SH 27XBRD (SUTURE) ×2 IMPLANT
SYR 10ML LL (SYRINGE) ×6 IMPLANT
SYR 50ML LL SCALE MARK (SYRINGE) ×3 IMPLANT
SYR CONTROL 10ML LL (SYRINGE) ×3 IMPLANT
TOWEL OR 17X26 10 PK STRL BLUE (TOWEL DISPOSABLE) ×3 IMPLANT
TUBE CONNECTING 12'X1/4 (SUCTIONS) ×1
TUBE CONNECTING 12X1/4 (SUCTIONS) ×2 IMPLANT
WATER STERILE IRR 3000ML UROMA (IV SOLUTION) IMPLANT
WATER STERILE IRR 500ML POUR (IV SOLUTION) IMPLANT
YANKAUER SUCT BULB TIP NO VENT (SUCTIONS) ×3 IMPLANT

## 2019-12-21 NOTE — Anesthesia Postprocedure Evaluation (Signed)
Anesthesia Post Note  Patient: [name]  Procedure(s) Performed: NESBIT PROCEDURE PENILE PLICATION AND PENILE BLOCK (N/A Penis)     Patient location during evaluation: PACU Anesthesia Type: General Level of consciousness: awake and alert Pain management: pain level controlled Vital Signs Assessment: post-procedure vital signs reviewed and stable Respiratory status: spontaneous breathing, nonlabored ventilation, respiratory function stable and patient connected to nasal cannula oxygen Cardiovascular status: blood pressure returned to baseline and stable Postop Assessment: no apparent nausea or vomiting Anesthetic complications: no    Last Vitals:  Vitals:   12/21/19 1152 12/21/19 1215  BP:  (!) 132/91  Pulse: 79 81  Resp: 18 20  Temp: 36.9 C   SpO2: 98% 97%    Last Pain:  Vitals:   12/21/19 1152  TempSrc: Oral  PainSc:                  [name]

## 2019-12-21 NOTE — Brief Op Note (Signed)
12/21/2019  10:44 AM  PATIENT:  [name]  69 y.o. male  PRE-OPERATIVE DIAGNOSIS:  PEYRONIES DISEASE  POST-OPERATIVE DIAGNOSIS:  PEYRONIES DISEASE  PROCEDURE:  Procedure(s) with comments: NESBIT PROCEDURE PENILE PLICATION AND PENILE BLOCK (N/A) - 1 HR  SURGEON:  Surgeon(s) and Role:    * Alexis Frock, MD - Primary  PHYSICIAN ASSISTANT:   ASSISTANTS: none   ANESTHESIA:   local and general  EBL:  minimal   BLOOD ADMINISTERED:none  DRAINS: none   LOCAL MEDICATIONS USED:  MARCAINE     SPECIMEN:  No Specimen  DISPOSITION OF SPECIMEN:  N/A  COUNTS:  YES  TOURNIQUET:  * No tourniquets in log *  DICTATION: .Other Dictation: Dictation Number [id number]  PLAN OF CARE: Discharge to home after PACU  PATIENT DISPOSITION:  PACU - hemodynamically stable.   Delay start of Pharmacological VTE agent (>24hrs) due to surgical blood loss or risk of bleeding: yes

## 2019-12-21 NOTE — Anesthesia Procedure Notes (Signed)
Procedure Name: LMA Insertion Date/Time: 12/21/2019 9:45 AM Performed by: Mitzie Na, CRNA Pre-anesthesia Checklist: Patient identified, Emergency Drugs available, Suction available and Patient being monitored Patient Re-evaluated:Patient Re-evaluated prior to induction Oxygen Delivery Method: Circle system utilized Preoxygenation: Pre-oxygenation with 100% oxygen Induction Type: IV induction LMA: LMA inserted LMA Size: 5.0 Number of attempts: 1 Placement Confirmation: positive ETCO2 and breath sounds checked- equal and bilateral Tube secured with: Tape Dental Injury: Teeth and Oropharynx as per pre-operative assessment

## 2019-12-21 NOTE — H&P (Signed)
[name] is an 69 y.o. male.    Chief Complaint: Pre-OP Penile Plication  HPI:   1 - Peyronie's Disease - pt with dorsal curvature with erections slowlyi progressive for about a year 2019. Estimate about 90 degrees. NO h/o Duptryen's. No known penile trauma. Able to get reliable eretions and maintain to orgasm. Exam with palpable dorsal plaque. AS of 08/2019 he thinks it is stable for few months, but not sure. 11/2019 stable curvature dorsal at abtou 45 degrees by his photos.   PMH sig for HLD, HTN, Obesity, DM2 (A1c 6's), prosthetic eye, ortho surgery. NO ischemic CV disease / blood thinners. His PCP is Jenna Luo MD with [facility].   Today " [name]" is seen to proceed with elective plication for stable bothersome penile curvature.     Past Medical History:  Diagnosis Date  . Arthritis   . Barrett esophagus   . Bladder neck obstruction   . Colon polyp   . Diabetes mellitus without complication ([place])   . Esophageal stricture   . GERD (gastroesophageal reflux disease)   . Glaucoma   . History of hiatal hernia   . Hyperlipidemia   . Hypertension   . Peyronie disease   . Prosthetic eye globe    Right eye  . Sleep apnea    does not use machine per pt.  . Smoker   . Wears glasses     Past Surgical History:  Procedure Laterality Date  . BACK SURGERY     lumbar decompression and fusion (Dr. Annette Stable) 2019  . COLONOSCOPY     DP  . ELBOW SURGERY Right   . fractured radius surgery    . KNEE ARTHROSCOPY N/A   . NASAL SINUS SURGERY     after injury has a metal plate  . POLYPECTOMY    . TONSILLECTOMY     removed as child unsure of age  . TOTAL KNEE ARTHROPLASTY Right   . traumatic eye enucleation  2011   right artifical eye    Family History  Problem Relation Age of Onset  . Colon cancer Brother 75  . Cancer Father        lung (smoker)  . Colon polyps Neg Hx   . Esophageal cancer Neg Hx   . Rectal cancer Neg Hx   . Stomach cancer Neg Hx   .  Pancreatic cancer Neg Hx   . Liver cancer Neg Hx   . Prostate cancer Neg Hx    Social History:  reports that he has quit smoking. His smoking use included cigarettes. He smoked 0.25 packs per day. He has never used smokeless tobacco. He reports current alcohol use. He reports that he does not use drugs.  Allergies: No Known Allergies  No medications prior to admission.    No results found for this or any previous visit (from the past 48 hour(s)). No results found.  Review of Systems  Constitutional: Negative.   HENT: Negative.   Eyes: Negative.   All other systems reviewed and are negative.   Height 6' (1.829 m), weight 122.5 kg. Physical Exam  Constitutional: He appears well-developed.  HENT:  Head: Normocephalic.  Eyes: Pupils are equal, round, and reactive to light.  Cardiovascular: Normal rate.  Respiratory: Effort normal.  GI: Soft.  Genitourinary:    Genitourinary Comments: Stable dorsal plaque   Musculoskeletal:     Cervical back: Normal range of motion.  Neurological: He is alert.  Skin: Skin is warm.  Psychiatric:  He has a normal mood and affect.     Assessment/Plan  Proceed as planned with penile plication. Risks, benefits, alternatives, expected peri-op course discussed previously and reiterated today.   Alexis Frock, MD 12/21/2019, 7:09 AM

## 2019-12-21 NOTE — Transfer of Care (Signed)
Immediate Anesthesia Transfer of Care Note  Patient: [name]  Procedure(s) Performed: NESBIT PROCEDURE PENILE PLICATION AND PENILE BLOCK (N/A Penis)  Patient Location: PACU  Anesthesia Type:General  Level of Consciousness: awake, alert  and oriented  Airway & Oxygen Therapy: Patient Spontanous Breathing and Patient connected to face mask oxygen  Post-op Assessment: Report given to RN, Post -op Vital signs reviewed and stable and Patient moving all extremities  Post vital signs: Reviewed and stable  Last Vitals:  Vitals Value Taken Time  BP 139/98 12/21/19 1050  Temp 36.7 C 12/21/19 1050  Pulse 80 12/21/19 1057  Resp 15 12/21/19 1057  SpO2 100 % 12/21/19 1057  Vitals shown include unvalidated device data.  Last Pain:  Vitals:   12/21/19 1050  TempSrc:   PainSc: 0-No pain      Patients Stated Pain Goal: 4 ([id number])  Complications: No apparent anesthesia complications

## 2019-12-21 NOTE — Discharge Instructions (Signed)
1 - Remove dressing tomorrow mid morning. You can shower starting tomorrow. No sexual stimulation until cleared by Dr. Tresa Moore.   2 - Call MD or go to ER for fever >102, severe pain / nausea / vomiting not relieved by medications, or acute change in medical status   Post Anesthesia Home Care Instructions  Activity: Get plenty of rest for the remainder of the day. A responsible individual must stay with you for 24 hours following the procedure.  For the next 24 hours, DO NOT: -Drive a car -Paediatric nurse -Drink alcoholic beverages -Take any medication unless instructed by your physician -Make any legal decisions or sign important papers.  Meals: Start with liquid foods such as gelatin or soup. Progress to regular foods as tolerated. Avoid greasy, spicy, heavy foods. If nausea and/or vomiting occur, drink only clear liquids until the nausea and/or vomiting subsides. Call your physician if vomiting continues.  Special Instructions/Symptoms: Your throat may feel dry or sore from the anesthesia or the breathing tube placed in your throat during surgery. If this causes discomfort, gargle with warm salt water. The discomfort should disappear within 24 hours.

## 2019-12-21 NOTE — Op Note (Signed)
[name], [name] [id number] ACCOUNT [id number] DATE OF BIRTH:[birth year] FACILITY: [facility] LOCATION: [facility] PHYSICIAN:Lindsay Soulliere, MD  OPERATIVE REPORT  DATE OF PROCEDURE:  12/21/2019  PREOPERATIVE DIAGNOSIS:  Peyronie's disease.  PROCEDURE: 1.  Penile plication. 2.  Penile block.  ESTIMATED BLOOD LOSS:  Nil.  COMPLICATIONS:  None.  SPECIMENS:  None.  FINDINGS:   1.  Dorsal curvature estimated approximately 45 degrees preplication.   2.  Resolution of dorsal curvature post plication.  INDICATIONS:  [name] is a pleasant 69 year old man with progressive history of dorsal penile curvature and this is becoming very problematic having sexual relations as well as having some initial pain.  His pain has since resolved and his curvature has  been stable for estimated approximately a year and has been verified on photodocumentation that it is stable condition.  Options were discussed for management including observation versus oral therapies versus injection medications versus surgical  approach and as he does have adequate erections he has elected to proceed with plication procedure.  Informed consent had previously been placed in medical Pollard.  DESCRIPTION OF PROCEDURE:  Patient identified as patient, procedure being penile plication, penile block was confirmed.  Procedure timeout was performed.  General anesthesia induced.  The patient was placed into a supine position.  Sterile field was  created prepping and draping the patient's penis, scrotum, an infraumbilical abdomen to the proximal thigh using iodine.  The patient is uncircumcised.  No evidence of phimosis.  A degloving incision was made approximately 1 cm proximal to corona of the  glans and carried down proximally and a tourniquet of double wrapped quarter 1/4-inch Penrose applied to base of the penis and a modified injection needle was placed into the left corporal body.  Artificial erection was  performed using injectable saline  approximately 100 mL.  This revealed approximately 45 degrees of pure dorsal curvature as anticipated.  This did again appear favorable for plication.  Point of maximal curvature was marked.  Next, using a 16-dot technique, 2-0 Ethibond was placed 4 dots  of the right, 4 dots to the left of the urethra.  Obvious that this was at the site of maximal flexion and this was tightened down.  This resulted in approximately 70% reduction of the dorsal curvature.  Additional 16-dot was placed proximal to this and  tied to tension corresponding to visually straight penis with artificial erection resulted in complete resolution of the clinical Peyronie's.  The tourniquet was removed.  The butterfly needle was removed.  The site of the butterfly injection was  cauterized resulted in excellent hemostasis and the skin was reapproximated using interrupted Vicryl at 6 o'clock, 12 o'clock positions and then 2 separate running suture lines of running Vicryl, which revealed an excellent skin apposition.  Next, a  penile block was performed.  10 mL of 0.25% Marcaine was placed along the presumed course the dorsal penile nerve with additional 10 mL in a ring block type fashion.  Next, a dressing of Xeroform at the area of the suture line followed by reduction of the foreskin and a dressing of Kling and Coban was applied without compression.  The procedure was terminated.  The patient tolerated the procedure well.  No immediate  complications.  The patient taken to postanesthesia care in stable condition.  Plan for discharge home after void.  CN/NUANCE  D:12/21/2019 T:12/21/2019 JOB:[id number]

## 2020-01-09 DIAGNOSIS — H401123 Primary open-angle glaucoma, left eye, severe stage: Secondary | ICD-10-CM | POA: Diagnosis not present

## 2020-01-10 DIAGNOSIS — N486 Induration penis plastica: Secondary | ICD-10-CM | POA: Diagnosis not present

## 2020-01-22 ENCOUNTER — Other Ambulatory Visit: Payer: Self-pay | Admitting: Family Medicine

## 2020-02-12 ENCOUNTER — Other Ambulatory Visit: Payer: Self-pay | Admitting: Family Medicine

## 2020-02-12 DIAGNOSIS — E785 Hyperlipidemia, unspecified: Secondary | ICD-10-CM

## 2020-04-05 ENCOUNTER — Other Ambulatory Visit: Payer: Self-pay | Admitting: Family Medicine

## 2020-05-08 ENCOUNTER — Other Ambulatory Visit: Payer: Self-pay

## 2020-05-08 ENCOUNTER — Ambulatory Visit (INDEPENDENT_AMBULATORY_CARE_PROVIDER_SITE_OTHER): Payer: Medicare Other | Admitting: Family Medicine

## 2020-05-08 VITALS — BP 124/82 | HR 85 | Temp 95.7°F | Ht 72.0 in | Wt 277.0 lb

## 2020-05-08 DIAGNOSIS — E7800 Pure hypercholesterolemia, unspecified: Secondary | ICD-10-CM | POA: Diagnosis not present

## 2020-05-08 DIAGNOSIS — R053 Chronic cough: Secondary | ICD-10-CM

## 2020-05-08 DIAGNOSIS — I10 Essential (primary) hypertension: Secondary | ICD-10-CM | POA: Diagnosis not present

## 2020-05-08 DIAGNOSIS — R05 Cough: Secondary | ICD-10-CM | POA: Diagnosis not present

## 2020-05-08 DIAGNOSIS — E118 Type 2 diabetes mellitus with unspecified complications: Secondary | ICD-10-CM | POA: Diagnosis not present

## 2020-05-08 NOTE — Progress Notes (Signed)
Subjective:    Patient ID: [name], male    DOB: November 23, 1950, 69 y.o.   MRN: [id number]  Patient is a very pleasant 69 year old African-American gentleman who is here today for medication check.  Past medical history significant for type 2 diabetes mellitus, hypertension, and dyslipidemia.  Around January, the patient quit smoking!.  However he states that he has a cough productive of clear mucus off and on ever since January.  He denies any chest pain or shortness of breath however he admits to being very sedentary.  He denies any hemoptysis.  He denies any fevers or chills.  He denies any acid reflux.  Patient admits he is not exercising.  His blood pressure is well controlled at 124/82.  He denies any polyuria or polydipsia.  He denies any neuropathy in his feet.  Diabetic foot exam was performed today and is normal.  Patient has had his Covid vaccine.  Although not listed on his chart, he had a diabetic eye exam approximately 3 months ago as he sees an ophthalmologist annually due to his glaucoma.  Past Medical History:  Diagnosis Date  . Arthritis   . Barrett esophagus   . Bladder neck obstruction   . Colon polyp   . Diabetes mellitus without complication ([place])   . Esophageal stricture   . GERD (gastroesophageal reflux disease)   . Glaucoma   . History of hiatal hernia   . Hyperlipidemia   . Hypertension   . Peyronie disease   . Prosthetic eye globe    Right eye  . Sleep apnea    does not use machine per pt.  . Smoker   . Wears glasses    Past Surgical History:  Procedure Laterality Date  . BACK SURGERY     lumbar decompression and fusion (Dr. Annette Stable) 2019  . COLONOSCOPY     DP  . ELBOW SURGERY Right   . fractured radius surgery    . KNEE ARTHROSCOPY N/A   . NASAL SINUS SURGERY     after injury has a metal plate  . NESBIT PROCEDURE N/A 12/21/2019   Procedure: NESBIT PROCEDURE PENILE PLICATION AND PENILE BLOCK;  Surgeon: Alexis Frock, MD;  Location: [facility];  Service: Urology;  Laterality: N/A;  1 HR  . POLYPECTOMY    . TONSILLECTOMY     removed as child unsure of age  . TOTAL KNEE ARTHROPLASTY Right   . 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 amLODipine (NORVASC) 5 MG tablet Take 1 tablet (5 mg total) by mouth daily. (Patient taking differently: Take 5 mg by mouth every morning. ) 90 tablet 3  . fenofibrate 160 MG tablet TAKE 1 TABLET BY MOUTH EVERY DAY 90 tablet 1  . ibuprofen (ADVIL) 400 MG tablet Take 400 mg by mouth every 6 (six) hours as needed.    . latanoprost (XALATAN) 0.005 % ophthalmic solution Place 1 drop into the left eye at bedtime.   11  . lisinopril-hydrochlorothiazide (ZESTORETIC) 20-25 MG tablet TAKE 1 TABLET BY MOUTH EVERY DAY 90 tablet 0  . metFORMIN (GLUCOPHAGE) 500 MG tablet TAKE 1 TABLET BY MOUTH TWICE A DAY WITH A MEAL NEEDS OFFICE VISIT AND LABS BEFORE FURTHER REFILLS 180 tablet 0  . omeprazole (PRILOSEC) 20 MG capsule Take 20 mg by mouth every morning.     Marland Kitchen oxyCODONE-acetaminophen (PERCOCET) 5-325 MG  tablet Take 1-2 tablets by mouth every 8 (eight) hours as needed for moderate pain or severe pain. Post-operatively 20 tablet 0  . pravastatin (PRAVACHOL) 40 MG tablet TAKE 1 TABLET BY MOUTH EVERY DAY 90 tablet 1  . senna-docusate (SENOKOT-S) 8.6-50 MG tablet Take 1 tablet by mouth 2 (two) times daily. While taking strong pain meds to prevent constipation. 10 tablet 0  . cyclobenzaprine (FLEXERIL) 10 MG tablet Take 1 tablet (10 mg total) by mouth 3 (three) times daily as needed for muscle spasms. (Patient not taking: Reported on 12/15/2019) 30 tablet 0   Current Facility-Administered Medications on File Prior to Visit  Medication Dose Route Frequency Provider Last Rate Last Admin  . 0.9 %  sodium chloride infusion  500 mL Intravenous Once Pyrtle, Lajuan Lines, MD       No Known Allergies Social History    Socioeconomic History  . Marital status: Divorced    Spouse name: Not on file  . Number of children: Not on file  . Years of education: Not on file  . Highest education level: Not on file  Occupational History  . Not on file  Tobacco Use  . Smoking status: Former Smoker    Packs/day: 0.25    Types: Cigarettes  . Smokeless tobacco: Never Used  . Tobacco comment: quit Jan. 2021  Vaping Use  . Vaping Use: Never used  Substance and Sexual Activity  . Alcohol use: Yes    Comment: weekends beer= 3-4   . Drug use: No  . Sexual activity: Yes  Other Topics Concern  . Not on file  Social History Narrative  . Not on file   Social Determinants of Health   Financial Resource Strain:   . Difficulty of Paying Living Expenses:   Food Insecurity:   . Worried About Charity fundraiser in the Last Year:   . Arboriculturist in the Last Year:   Transportation Needs:   . Film/video editor (Medical):   Marland Kitchen Lack of Transportation (Non-Medical):   Physical Activity:   . Days of Exercise per Week:   . Minutes of Exercise per Session:   Stress:   . Feeling of Stress :   Social Connections:   . Frequency of Communication with Friends and Family:   . Frequency of Social Gatherings with Friends and Family:   . Attends Religious Services:   . Active Member of Clubs or Organizations:   . Attends Archivist Meetings:   Marland Kitchen Marital Status:   Intimate Partner Violence:   . Fear of Current or Ex-Partner:   . Emotionally Abused:   Marland Kitchen Physically Abused:   . Sexually Abused:       Review of Systems  All other systems reviewed and are negative.      Objective:   Physical Exam Vitals reviewed.  Constitutional:      Appearance: He is well-developed.  HENT:     Head: Normocephalic and atraumatic.     Right Ear: External ear normal.     Left Ear: External ear normal.     Nose: Nose normal.     Mouth/Throat:     Pharynx: No oropharyngeal exudate.  Eyes:      Conjunctiva/sclera: Conjunctivae normal.   Neck:     Thyroid: No thyromegaly.     Vascular: No JVD.     Trachea: No tracheal deviation.  Cardiovascular:     Rate and Rhythm: Normal rate and regular rhythm.  Heart sounds: Normal heart sounds. No murmur heard.   Pulmonary:     Effort: Pulmonary effort is normal. No respiratory distress.     Breath sounds: Normal breath sounds. No stridor. No wheezing or rales.  Abdominal:     General: Bowel sounds are normal. There is no distension.     Palpations: Abdomen is soft. There is no mass.     Tenderness: There is no abdominal tenderness. There is no guarding or rebound.  Musculoskeletal:     Cervical back: Neck supple.  Lymphadenopathy:     Cervical: No cervical adenopathy.  Neurological:     Mental Status: He is alert and oriented to person, place, and time.     Cranial Nerves: No cranial nerve deficit.     Motor: No abnormal muscle tone.     Coordination: Coordination normal.     Deep Tendon Reflexes: Reflexes are normal and symmetric.  Psychiatric:        Behavior: Behavior normal.        Thought Content: Thought content normal.        Judgment: Judgment normal.           Assessment & Plan:  Benign essential HTN  Diabetes mellitus type 2 with complications ([place]) - Plan: Hemoglobin A1c, CBC with Differential/Platelet, COMPLETE METABOLIC PANEL WITH GFR, Lipid panel, Microalbumin, urine  Pure hypercholesterolemia  Chronic cough - Plan: DG Chest 2 View  Blood pressure today is well controlled.  We will make no changes in his antihypertensives.  Patient has had his Covid vaccine.  Diabetic eye exam and diabetic foot exam are up-to-date.  I will check a hemoglobin A1c.  Goal hemoglobin A1c is less than 6.5.  I will check a fasting lipid panel.  Goal LDL cholesterol is less than 100.  I will check a urine microalbumin to evaluate for any diabetic nephropathy.  My biggest concern is this chronic cough that the patient has  developed.  I am extremely proud of him from quitting smoking.  However I would like to obtain a basic chest x-ray to see if there is any evidence of malignancy in the lung or possibly even any signs of possible emphysema.  I am concerned that the patient may have chronic bronchitis related to smoking.

## 2020-05-09 LAB — CBC WITH DIFFERENTIAL/PLATELET
Absolute Monocytes: 635 cells/uL (ref 200–950)
Basophils Absolute: 41 cells/uL (ref 0–200)
Basophils Relative: 0.6 %
Eosinophils Absolute: 283 cells/uL (ref 15–500)
Eosinophils Relative: 4.1 %
HCT: 40.3 % (ref 38.5–50.0)
Hemoglobin: 13.8 g/dL (ref 13.2–17.1)
Lymphs Abs: 2905 cells/uL (ref 850–3900)
MCH: 31.1 pg (ref 27.0–33.0)
MCHC: 34.2 g/dL (ref 32.0–36.0)
MCV: 90.8 fL (ref 80.0–100.0)
MPV: 10.8 fL (ref 7.5–12.5)
Monocytes Relative: 9.2 %
Neutro Abs: 3036 cells/uL (ref 1500–7800)
Neutrophils Relative %: 44 %
Platelets: 269 10*3/uL (ref 140–400)
RBC: 4.44 10*6/uL (ref 4.20–5.80)
RDW: 13 % (ref 11.0–15.0)
Total Lymphocyte: 42.1 %
WBC: 6.9 10*3/uL (ref 3.8–10.8)

## 2020-05-09 LAB — COMPLETE METABOLIC PANEL WITH GFR
AG Ratio: 1.5 (calc) (ref 1.0–2.5)
ALT: 24 U/L (ref 9–46)
AST: 17 U/L (ref 10–35)
Albumin: 4.4 g/dL (ref 3.6–5.1)
Alkaline phosphatase (APISO): 31 U/L — ABNORMAL LOW (ref 35–144)
BUN: 20 mg/dL (ref 7–25)
CO2: 24 mmol/L (ref 20–32)
Calcium: 9.8 mg/dL (ref 8.6–10.3)
Chloride: 101 mmol/L (ref 98–110)
Creat: 0.99 mg/dL (ref 0.70–1.25)
GFR, Est African American: 90 mL/min/{1.73_m2} (ref >=60)
GFR, Est Non African American: 78 mL/min/{1.73_m2} (ref >=60)
Globulin: 2.9 g/dL (calc) (ref 1.9–3.7)
Glucose, Bld: 118 mg/dL — ABNORMAL HIGH (ref 65–99)
Potassium: 3.9 mmol/L (ref 3.5–5.3)
Sodium: 138 mmol/L (ref 135–146)
Total Bilirubin: 0.5 mg/dL (ref 0.2–1.2)
Total Protein: 7.3 g/dL (ref 6.1–8.1)

## 2020-05-09 LAB — HEMOGLOBIN A1C
Hgb A1c MFr Bld: 6.8 % of total Hgb — ABNORMAL HIGH (ref <5.7)
Mean Plasma Glucose: 148 (calc)
eAG (mmol/L): 8.2 (calc)

## 2020-05-09 LAB — LIPID PANEL
Cholesterol: 235 mg/dL — ABNORMAL HIGH (ref <200)
HDL: 43 mg/dL (ref >=40)
Non-HDL Cholesterol (Calc): 192 mg/dL (calc) — ABNORMAL HIGH (ref <130)
Total CHOL/HDL Ratio: 5.5 (calc) — ABNORMAL HIGH (ref <5.0)
Triglycerides: 883 mg/dL — ABNORMAL HIGH (ref <150)

## 2020-05-09 LAB — MICROALBUMIN, URINE: Microalb, Ur: 5.6 mg/dL

## 2020-05-10 ENCOUNTER — Other Ambulatory Visit: Payer: Self-pay

## 2020-05-10 MED ORDER — ICOSAPENT ETHYL 1 G PO CAPS
2.0000 g | ORAL_CAPSULE | Freq: Two times a day (BID) | ORAL | 1 refills | Status: DC
Start: 2020-05-10 — End: 2021-05-17

## 2020-05-11 ENCOUNTER — Ambulatory Visit
Admission: RE | Admit: 2020-05-11 | Discharge: 2020-05-11 | Disposition: A | Discharge status: Home / Self Care | Payer: Medicare Other | Source: Ambulatory Visit | Attending: Family Medicine | Admitting: Family Medicine

## 2020-05-11 DIAGNOSIS — R05 Cough: Secondary | ICD-10-CM | POA: Diagnosis not present

## 2020-05-11 DIAGNOSIS — R053 Chronic cough: Secondary | ICD-10-CM

## 2020-05-16 DIAGNOSIS — H524 Presbyopia: Secondary | ICD-10-CM | POA: Diagnosis not present

## 2020-05-16 DIAGNOSIS — H401123 Primary open-angle glaucoma, left eye, severe stage: Secondary | ICD-10-CM | POA: Diagnosis not present

## 2020-05-16 DIAGNOSIS — H25012 Cortical age-related cataract, left eye: Secondary | ICD-10-CM | POA: Diagnosis not present

## 2020-05-16 DIAGNOSIS — H2512 Age-related nuclear cataract, left eye: Secondary | ICD-10-CM | POA: Diagnosis not present

## 2020-06-13 DIAGNOSIS — H401123 Primary open-angle glaucoma, left eye, severe stage: Secondary | ICD-10-CM | POA: Diagnosis not present

## 2020-07-02 ENCOUNTER — Other Ambulatory Visit: Payer: Self-pay | Admitting: Family Medicine

## 2020-07-04 ENCOUNTER — Ambulatory Visit: Payer: Medicare Other | Attending: Internal Medicine

## 2020-07-04 ENCOUNTER — Other Ambulatory Visit (HOSPITAL_COMMUNITY): Payer: Self-pay | Admitting: Internal Medicine

## 2020-07-04 DIAGNOSIS — Z23 Encounter for immunization: Secondary | ICD-10-CM

## 2020-07-04 NOTE — Progress Notes (Signed)
   Covid-19 Vaccination Clinic  Name:  [name]    MRN: [id number] DOB: 1951/09/02  07/04/2020  [name] was observed post Covid-19 immunization for 15 minutes without incident. He was provided with Vaccine Information Sheet and instruction to access the V-Safe system.   [name] was instructed to call 911 with any severe reactions post vaccine: Marland Kitchen Difficulty breathing  . Swelling of face and throat  . A fast heartbeat  . A bad rash all over body  . Dizziness and weakness

## 2020-07-06 ENCOUNTER — Other Ambulatory Visit: Payer: Self-pay | Admitting: Family Medicine

## 2020-07-20 ENCOUNTER — Other Ambulatory Visit: Payer: Self-pay | Admitting: Family Medicine

## 2020-08-09 ENCOUNTER — Other Ambulatory Visit: Payer: Self-pay | Admitting: Family Medicine

## 2020-08-09 DIAGNOSIS — E785 Hyperlipidemia, unspecified: Secondary | ICD-10-CM

## 2020-08-31 ENCOUNTER — Encounter: Payer: Self-pay | Admitting: Nurse Practitioner

## 2020-08-31 ENCOUNTER — Ambulatory Visit (INDEPENDENT_AMBULATORY_CARE_PROVIDER_SITE_OTHER): Payer: Medicare Other | Admitting: Nurse Practitioner

## 2020-08-31 ENCOUNTER — Other Ambulatory Visit: Payer: Self-pay

## 2020-08-31 VITALS — BP 120/76 | HR 105 | Temp 97.9°F | Ht 72.0 in | Wt 271.8 lb

## 2020-08-31 DIAGNOSIS — M25571 Pain in right ankle and joints of right foot: Secondary | ICD-10-CM | POA: Diagnosis not present

## 2020-08-31 DIAGNOSIS — M25572 Pain in left ankle and joints of left foot: Secondary | ICD-10-CM | POA: Diagnosis not present

## 2020-08-31 MED ORDER — PREDNISONE 20 MG PO TABS: 40.0000 mg | ORAL_TABLET | Freq: Every day | ORAL | 0 refills | Status: DC

## 2020-08-31 NOTE — Patient Instructions (Signed)
Gout  Gout is a condition that causes painful swelling of the joints. Gout is a type of inflammation of the joints (arthritis). This condition is caused by having too much uric acid in the body. Uric acid is a chemical that forms when the body breaks down substances called purines. Purines are important for building body proteins. When the body has too much uric acid, sharp crystals can form and build up inside the joints. This causes pain and swelling. Gout attacks can happen quickly and may be very painful (acute gout). Over time, the attacks can affect more joints and become more frequent (chronic gout). Gout can also cause uric acid to build up under the skin and inside the kidneys. What are the causes? This condition is caused by too much uric acid in your blood. This can happen because:  Your kidneys do not remove enough uric acid from your blood. This is the most common cause.  Your body makes too much uric acid. This can happen with some cancers and cancer treatments. It can also occur if your body is breaking down too many red blood cells (hemolytic anemia).  You eat too many foods that are high in purines. These foods include organ meats and some seafood. Alcohol, especially beer, is also high in purines. A gout attack may be triggered by trauma or stress. What increases the risk? You are more likely to develop this condition if you:  Have a family history of gout.  Are male and middle-aged.  Are male and have gone through menopause.  Are obese.  Frequently drink alcohol, especially beer.  Are dehydrated.  Lose weight too quickly.  Have an organ transplant.  Have lead poisoning.  Take certain medicines, including aspirin, cyclosporine, diuretics, levodopa, and niacin.  Have kidney disease.  Have a skin condition called psoriasis. What are the signs or symptoms? An attack of acute gout happens quickly. It usually occurs in just one joint. The most common place is  the big toe. Attacks often start at night. Other joints that may be affected include joints of the feet, ankle, knee, fingers, wrist, or elbow. Symptoms of this condition may include:  Severe pain.  Warmth.  Swelling.  Stiffness.  Tenderness. The affected joint may be very painful to touch.  Shiny, red, or purple skin.  Chills and fever. Chronic gout may cause symptoms more frequently. More joints may be involved. You may also have white or yellow lumps (tophi) on your hands or feet or in other areas near your joints. How is this diagnosed? This condition is diagnosed based on your symptoms, medical history, and physical exam. You may have tests, such as:  Blood tests to measure uric acid levels.  Removal of joint fluid with a thin needle (aspiration) to look for uric acid crystals.  X-rays to look for joint damage. How is this treated? Treatment for this condition has two phases: treating an acute attack and preventing future attacks. Acute gout treatment may include medicines to reduce pain and swelling, including:  NSAIDs.  Steroids. These are strong anti-inflammatory medicines that can be taken by mouth (orally) or injected into a joint.  Colchicine. This medicine relieves pain and swelling when it is taken soon after an attack. It can be given by mouth or through an IV. Preventive treatment may include:  Daily use of smaller doses of NSAIDs or colchicine.  Use of a medicine that reduces uric acid levels in your blood.  Changes to your diet. You may   need to see a dietitian about what to eat and drink to prevent gout. Follow these instructions at home: During a gout attack   If directed, put ice on the affected area: ? Put ice in a plastic bag. ? Place a towel between your skin and the bag. ? Leave the ice on for 20 minutes, 2-3 times a day.  Raise (elevate) the affected joint above the level of your heart as often as possible.  Rest the joint as much as possible.  If the affected joint is in your leg, you may be given crutches to use.  Follow instructions from your health care provider about eating or drinking restrictions. Avoiding future gout attacks  Follow a low-purine diet as told by your dietitian or health care provider. Avoid foods and drinks that are high in purines, including liver, kidney, anchovies, asparagus, herring, mushrooms, mussels, and beer.  Maintain a healthy weight or lose weight if you are overweight. If you want to lose weight, talk with your health care provider. It is important that you do not lose weight too quickly.  Start or maintain an exercise program as told by your health care provider. Eating and drinking  Drink enough fluids to keep your urine pale yellow.  If you drink alcohol: ? Limit how much you use to:  0-1 drink a day for women.  0-2 drinks a day for men. ? Be aware of how much alcohol is in your drink. In the [place] one drink equals one 12 oz bottle of beer (355 mL) one 5 oz glass of wine (148 mL), or one 1 oz glass of hard liquor (44 mL). General instructions  Take over-the-counter and prescription medicines only as told by your health care provider.  Do not drive or use heavy machinery while taking prescription pain medicine.  Return to your normal activities as told by your health care provider. Ask your health care provider what activities are safe for you.  Keep all follow-up visits as told by your health care provider. This is important. Contact a health care provider if you have:  Another gout attack.  Continuing symptoms of a gout attack after 10 days of treatment.  Side effects from your medicines.  Chills or a fever.  Burning pain when you urinate.  Pain in your lower back or belly. Get help right away if you:  Have severe or uncontrolled pain.  Cannot urinate. Summary  Gout is painful swelling of the joints caused by inflammation.  The most common site of pain is the big  toe, but it can affect other joints in the body.  Medicines and dietary changes can help to prevent and treat gout attacks. This information is not intended to replace advice given to you by your health care provider. Make sure you discuss any questions you have with your health care provider. Document Revised: 03/31/2018 Document Reviewed: 03/31/2018 Elsevier Patient Education  2020 [employer] Inc.  

## 2020-08-31 NOTE — Assessment & Plan Note (Addendum)
Acute, ongoing.  Differentials include gout, cellulitis, ankle injury (sprain or fracture), septic arthritis.  With pain and swelling very localized to medial malleolus, and with no fever or other systemic signs of infection, unlikely that this is cellulitis or septic arthritis.  Not likely due to injury without fall or acute strain.  Will check uric acid level today and treat with prednisone 40 mg daily x 5 days for gout.  Encouraged to obtain ankle x-ray when able if pain does not improve with prednisone.  Follow up if pain persists or does not improve.  May consider allopurinol in future if flares recur.

## 2020-08-31 NOTE — Progress Notes (Signed)
Subjective:    Patient ID: [name], male    DOB: 07/08/1951, 69 y.o.   MRN: [id number]  HPI: GRANTHAM Pollard is a 69 y.o. male presenting for  Chief Complaint  Patient presents with  . Pain    Pain in both feet, has knot on the side of left foot for the past 2 days. Did lots of walking in boots, says knot developed then. Both feet very swollen   FOOT PAIN Duration: days Involved foot: bilateral Mechanism of injury: unknown Location: both foot, on inside of ankle Onset: gradual  Severity: severe  Quality: throbbing, only when walking Frequency: with walking Radiation: yes Aggravating factors: walking, certain movements  Alleviating factors: elevation, tried soaking in hot water, ibuprofen Status: worse Treatments attempted: hot water, ibuprofen  Relief with NSAIDs?:  mild Weakness with weight bearing or walking: yes Morning stiffness: yes Swelling: yes Redness: yes Bruising: no Paresthesias / decreased sensation: no  Fevers:no  No Known Allergies  Outpatient Encounter Medications as of 08/31/2020  Medication Sig  . amLODipine (NORVASC) 5 MG tablet TAKE 1 TABLET BY MOUTH EVERY DAY  . fenofibrate 160 MG tablet TAKE 1 TABLET BY MOUTH EVERY DAY  . ibuprofen (ADVIL) 400 MG tablet Take 400 mg by mouth every 6 (six) hours as needed.  Marland Kitchen icosapent Ethyl (VASCEPA) 1 g capsule Take 2 capsules (2 g total) by mouth 2 (two) times daily.  Marland Kitchen latanoprost (XALATAN) 0.005 % ophthalmic solution Place 1 drop into the left eye at bedtime.   Marland Kitchen lisinopril-hydrochlorothiazide (ZESTORETIC) 20-25 MG tablet TAKE 1 TABLET BY MOUTH EVERY DAY  . metFORMIN (GLUCOPHAGE) 500 MG tablet TAKE 1 TABLET BY MOUTH TWICE A DAY WITH A MEAL NEEDS OFFICE VISIT AND LABS FOR REFILLS  . omeprazole (PRILOSEC) 20 MG capsule Take 20 mg by mouth every morning.   . pravastatin (PRAVACHOL) 40 MG tablet TAKE 1 TABLET BY MOUTH EVERY DAY  . senna-docusate (SENOKOT-S) 8.6-50 MG tablet Take 1 tablet by mouth 2  (two) times daily. While taking strong pain meds to prevent constipation.  . predniSONE (DELTASONE) 20 MG tablet Take 2 tablets (40 mg total) by mouth daily with breakfast.  . [DISCONTINUED] cyclobenzaprine (FLEXERIL) 10 MG tablet Take 1 tablet (10 mg total) by mouth 3 (three) times daily as needed for muscle spasms. (Patient not taking: No sig reported)  . [DISCONTINUED] oxyCODONE-acetaminophen (PERCOCET) 5-325 MG tablet Take 1-2 tablets by mouth every 8 (eight) hours as needed for moderate pain or severe pain. Post-operatively (Patient not taking: Reported on 08/31/2020)   Facility-Administered Encounter Medications as of 08/31/2020  Medication  . 0.9 %  sodium chloride infusion    Patient Active Problem List   Diagnosis Date Noted  . Acute bilateral ankle pain 08/31/2020  . Spondylolisthesis at L3-L4 level 07/26/2018  . Prediabetes   . Sleep apnea   . Smoker   . Prosthetic eye globe   . Obesity (BMI 30-39.9) 07/08/2013  . GERD 01/17/2010  . SLEEP APNEA 01/17/2010  . HYPERTENSION 01/11/2008  . HYPERLIPIDEMIA 07/06/2006    Past Medical History:  Diagnosis Date  . Arthritis   . Barrett esophagus   . Bladder neck obstruction   . Colon polyp   . Diabetes mellitus without complication ([place])   . Esophageal stricture   . GERD (gastroesophageal reflux disease)   . Glaucoma   . History of hiatal hernia   . Hyperlipidemia   . Hypertension   . Peyronie disease   . Prosthetic eye  globe    Right eye  . Sleep apnea    does not use machine per pt.  . Smoker   . Wears glasses     Relevant past medical, surgical, family and social history reviewed and updated as indicated. Interim medical history since our last visit reviewed.  Review of Systems  Constitutional: Negative.  Negative for activity change, appetite change, fatigue and fever.  Musculoskeletal: Positive for arthralgias (ankle pain), gait problem (due to ankle pain) and joint swelling (left ankle and foot). Negative for  back pain and myalgias.  Skin: Negative.  Negative for rash and wound.  Neurological: Negative for dizziness and headaches.  Psychiatric/Behavioral: Negative.     Per HPI unless specifically indicated above     Objective:    BP 120/76   Pulse (!) 105   Temp 97.9 F (36.6 C)   Ht 6' (1.829 m)   Wt 271 lb 12.8 oz (123.3 kg)   SpO2 95%   BMI 36.86 kg/m   Wt Readings from Last 3 Encounters:  08/31/20 271 lb 12.8 oz (123.3 kg)  05/08/20 277 lb (125.6 kg)  12/21/19 271 lb 1.6 oz (123 kg)    Physical Exam Vitals and nursing note reviewed.  Constitutional:      General: He is not in acute distress.    Appearance: Normal appearance. He is not toxic-appearing.  Musculoskeletal:        General: Swelling (left medial ankle with mild erythema to ankle) and tenderness (left ankle) present. No signs of injury.     Right lower leg: No swelling or tenderness. No edema.     Left lower leg: No swelling or tenderness. No edema.     Right ankle: No swelling or ecchymosis. Tenderness present over the medial malleolus. Normal range of motion. Normal pulse.     Left ankle: Swelling (localized to medial ankle) present. No ecchymosis. Tenderness present over the medial malleolus. Decreased range of motion. Normal pulse.  Skin:    General: Skin is warm and dry.     Capillary Refill: Capillary refill takes less than 2 seconds.     Coloration: Skin is not jaundiced or pale.     Findings: Erythema (left ankle) present.  Neurological:     General: No focal deficit present.     Mental Status: He is alert and oriented to person, place, and time.     Sensory: No sensory deficit.     Motor: No weakness.     Coordination: Coordination normal.  Psychiatric:        Mood and Affect: Mood normal.        Behavior: Behavior normal.        Thought Content: Thought content normal.        Judgment: Judgment normal.        Assessment & Plan:   Problem List Items Addressed This Visit      Other   Acute  bilateral ankle pain - Primary    Acute, ongoing.  Differentials include gout, cellulitis, ankle injury (sprain or fracture), septic arthritis.  With pain and swelling very localized to medial malleolus, and with no fever or other systemic signs of infection, unlikely that this is cellulitis or septic arthritis.  Not likely due to injury without fall or acute strain.  Will check uric acid level today and treat with prednisone 40 mg daily x 5 days for gout.  Encouraged to obtain ankle x-ray when able if pain does not improve with prednisone.  Follow up if pain persists or does not improve.  May consider allopurinol in future if flares recur.      Relevant Orders   Uric acid   DG Ankle Complete Left       Follow up plan: Return if symptoms worsen or fail to improve.

## 2020-09-01 LAB — URIC ACID: Uric Acid, Serum: 8.8 mg/dL — ABNORMAL HIGH (ref 4.0–8.0)

## 2020-09-20 ENCOUNTER — Other Ambulatory Visit: Payer: Self-pay | Admitting: Family Medicine

## 2020-10-15 DIAGNOSIS — H401123 Primary open-angle glaucoma, left eye, severe stage: Secondary | ICD-10-CM | POA: Diagnosis not present

## 2020-12-10 ENCOUNTER — Other Ambulatory Visit: Payer: Self-pay | Admitting: Family Medicine

## 2020-12-10 DIAGNOSIS — E785 Hyperlipidemia, unspecified: Secondary | ICD-10-CM

## 2020-12-20 ENCOUNTER — Other Ambulatory Visit: Payer: Self-pay | Admitting: Family Medicine

## 2021-01-16 ENCOUNTER — Encounter: Payer: Self-pay | Admitting: Nurse Practitioner

## 2021-01-16 ENCOUNTER — Telehealth (INDEPENDENT_AMBULATORY_CARE_PROVIDER_SITE_OTHER): Payer: 59 | Admitting: Nurse Practitioner

## 2021-01-16 ENCOUNTER — Other Ambulatory Visit: Payer: Self-pay

## 2021-01-16 DIAGNOSIS — E781 Pure hyperglyceridemia: Secondary | ICD-10-CM

## 2021-01-16 DIAGNOSIS — Z0001 Encounter for general adult medical examination with abnormal findings: Secondary | ICD-10-CM | POA: Diagnosis not present

## 2021-01-16 DIAGNOSIS — E785 Hyperlipidemia, unspecified: Secondary | ICD-10-CM

## 2021-01-16 DIAGNOSIS — Z0000 Encounter for general adult medical examination without abnormal findings: Secondary | ICD-10-CM

## 2021-01-16 DIAGNOSIS — I10 Essential (primary) hypertension: Secondary | ICD-10-CM | POA: Diagnosis not present

## 2021-01-16 DIAGNOSIS — E1169 Type 2 diabetes mellitus with other specified complication: Secondary | ICD-10-CM | POA: Diagnosis not present

## 2021-01-16 NOTE — Progress Notes (Signed)
Patient: [name], Male    DOB: Oct 02, 1950, 70 y.o.   MRN: [id number]  Visit Date: 01/16/2021  Today's Provider: Eulogio Bear, NP   Chief Complaint  Patient presents with  . Medicare Wellness    Subjective:   ERIQUE Pollard is a 70 y.o. male who presents today for his Subsequent Annual Wellness Visit.  Care Team: Primary care provider - Jenna Luo Ophthalmologist - Marygrace Drought [facility] - Alexis Frock Gastroenterology - Zenovia Jarred Neurosurgery - Earnie Larsson  HPI   Left eye cataract -follows with ophthalmologist and is taking eyedrops.  Right eye is prosthetic.  Hyperlipidemia- reports he is currently taking pravastatin 40 mg, Vascepa 2 g twice daily, and fenofibrate 160 mg daily.  No issues with these medications.  Type 2 diabetes- currently taking metformin 500 mg twice daily with meals.  Tolerating this medication well.  Hypertension- currently taking lisinopril-hydrochlorothiazide 20-25 and amlodipine 5 mg daily.  No issues with these medications.  Does not check his blood pressure at home.  Review of Systems  Constitutional: Negative.   HENT: Negative.   Eyes: Negative.   Respiratory: Negative.   Cardiovascular: Negative.   Gastrointestinal: Negative.   Endocrine: Negative.   Genitourinary: Negative.   Musculoskeletal: Negative.   Skin: Negative.   Neurological: Negative.   Hematological: Negative.   Psychiatric/Behavioral: Negative.     Past Medical History:  Diagnosis Date  . Arthritis   . Barrett esophagus   . Bladder neck obstruction   . Colon polyp   . Diabetes mellitus without complication ([place])   . Esophageal stricture   . GERD (gastroesophageal reflux disease)   . Glaucoma   . History of hiatal hernia   . Hyperlipidemia   . Hypertension   . Peyronie disease   . Prosthetic eye globe    Right eye  . Sleep apnea    does not use machine per pt.  . Smoker   . Wears glasses     Past Surgical History:  Procedure  Laterality Date  . BACK SURGERY     lumbar decompression and fusion (Dr. Annette Stable) 2019  . COLONOSCOPY     DP  . ELBOW SURGERY Right   . fractured radius surgery    . KNEE ARTHROSCOPY N/A   . NASAL SINUS SURGERY     after injury has a metal plate  . NESBIT PROCEDURE N/A 12/21/2019   Procedure: NESBIT PROCEDURE PENILE PLICATION AND PENILE BLOCK;  Surgeon: Alexis Frock, MD;  Location: [facility];  Service: Urology;  Laterality: N/A;  1 HR  . POLYPECTOMY    . TONSILLECTOMY     removed as child unsure of age  . TOTAL KNEE ARTHROPLASTY Right   . traumatic eye enucleation  2011   right artifical eye    Family History  Problem Relation Age of Onset  . Colon cancer Brother 15  . Cancer Father        lung (smoker)  . Colon polyps Neg Hx   . Esophageal cancer Neg Hx   . Rectal cancer Neg Hx   . Stomach cancer Neg Hx   . Pancreatic cancer Neg Hx   . Liver cancer Neg Hx   . Prostate cancer Neg Hx     Social History   Socioeconomic History  . Marital status: Divorced    Spouse name: Not on file  . Number of children: Not on file  . Years of education: Not on file  . Highest education level:  Not on file  Occupational History  . Not on file  Tobacco Use  . Smoking status: Former Smoker    Packs/day: 0.25    Types: Cigarettes  . Smokeless tobacco: Never Used  . Tobacco comment: quit Jan. 2021  Vaping Use  . Vaping Use: Never used  Substance and Sexual Activity  . Alcohol use: Yes    Comment: weekends beer= 3-4   . Drug use: No  . Sexual activity: Yes  Other Topics Concern  . Not on file  Social History Narrative  . Not on file   Social Determinants of Health   Financial Resource Strain: Not on file  Food Insecurity: Not on file  Transportation Needs: Not on file  Physical Activity: Not on file  Stress: Not on file  Social Connections: Not on file  Intimate Partner Violence: Not on file    Outpatient Encounter Medications as of 01/16/2021   Medication Sig  . amLODipine (NORVASC) 5 MG tablet TAKE 1 TABLET BY MOUTH EVERY DAY  . COVID-19 mRNA vaccine, Pfizer, 30 MCG/0.3ML injection TO BE ADMINISTERED BY THE PHARMACIST  . fenofibrate 160 MG tablet TAKE 1 TABLET BY MOUTH EVERY DAY  . ibuprofen (ADVIL) 400 MG tablet Take 400 mg by mouth every 6 (six) hours as needed.  Marland Kitchen icosapent Ethyl (VASCEPA) 1 g capsule Take 2 capsules (2 g total) by mouth 2 (two) times daily.  . influenza vaccine adjuvanted (FLUAD) 0.5 ML injection TO BE ADMINISTERED BY PHARMACIST  . latanoprost (XALATAN) 0.005 % ophthalmic solution Place 1 drop into the left eye at bedtime.   Marland Kitchen lisinopril-hydrochlorothiazide (ZESTORETIC) 20-25 MG tablet TAKE 1 TABLET BY MOUTH EVERY DAY  . metFORMIN (GLUCOPHAGE) 500 MG tablet TAKE 1 TABLET BY MOUTH TWICE A DAY WITH A MEAL NEEDS OFFICE VISIT AND LABS FOR REFILLS  . omeprazole (PRILOSEC) 20 MG capsule Take 20 mg by mouth every morning.   . pravastatin (PRAVACHOL) 40 MG tablet TAKE 1 TABLET BY MOUTH EVERY DAY  . timolol (TIMOPTIC) 0.5 % ophthalmic solution Place 1 drop into the left eye 2 (two) times daily.  . [DISCONTINUED] predniSONE (DELTASONE) 20 MG tablet Take 2 tablets (40 mg total) by mouth daily with breakfast.  . [DISCONTINUED] senna-docusate (SENOKOT-S) 8.6-50 MG tablet Take 1 tablet by mouth 2 (two) times daily. While taking strong pain meds to prevent constipation.  . [DISCONTINUED] 0.9 %  sodium chloride infusion    No facility-administered encounter medications on file as of 01/16/2021.   Functional Status Survey: Is the patient deaf or have difficulty hearing?: No Does the patient have difficulty seeing, even when wearing glasses/contacts?: Yes (cataract in left eye) Does the patient have difficulty concentrating, remembering, or making decisions?: No Does the patient have difficulty walking or climbing stairs?: Yes Does the patient have difficulty dressing or bathing?: No Does the patient have difficulty doing  errands alone such as visiting a doctor's office or shopping?: No  Fall Risk Assessment The patient does not have a history of falls. I did not complete a risk assessment for falls. A plan of care for falls was not documented.  Depression Screen Depression screen [facility] 2/9 01/16/2021 07/22/2019 10/02/2017 07/02/2017 07/08/2013  Decreased Interest 0 0 0 0 0  Down, Depressed, Hopeless 0 0 0 0 0  PHQ - 2 Score 0 0 0 0 0   6CIT Screen 01/16/2021  What Year? 0 points  What month? 0 points  What time? 0 points  Count back from 20 0 points  Months in reverse 0 points  Repeat phrase 4 points  Total Score 4   Advanced Directives Does patient have a HCPOA?    no If yes, name and contact information:  Does patient have a living will or MOST form?  no  Objective:   Vitals: There were no vitals taken for this visit. There is no height or weight on file to calculate BMI. No exam data present  Physical Exam Neurological:     Mental Status: He is alert and oriented to person, place, and time.  Psychiatric:        Mood and Affect: Mood normal.        Behavior: Behavior normal.        Thought Content: Thought content normal.        Judgment: Judgment normal.    Assessment & Plan:     Annual Wellness Visit  Hyperlipidemia with high triglycerides -continue current medications.  Due for fasting lab work-we will order and have patient schedule follow-up visit with PCP.  Continue current medications for now.  Type 2 diabetes- due for hemoglobin A1c recheck.  We will continue metformin as prescribed for now.  He is up-to-date on foot exam, eye exam.  Hypertension- continue lisinopril hydrochlorothiazide 20-25 and amlodipine 5 mg daily for now.  Due for kidney function check-we will order CMP and schedule follow-up with PCP.  Reviewed patient's Family Medical History Reviewed and updated list of patient's medical providers Assessment of cognitive impairment was done Assessed patient's  functional ability Established a written schedule for health screening [place] Completed and Reviewed   Immunization History  Administered Date(s) Administered  . Fluad Quad(high Dose 65+) 07/22/2019, 07/04/2020  . PFIZER(Purple Top)SARS-COV-2 Vaccination 10/27/2019, 11/21/2019, 07/04/2020  . Pneumococcal Conjugate-13 10/02/2017  . Pneumococcal Polysaccharide-23 08/15/2015  . Td 09/23/2007  . Zoster 07/08/2013    Health Maintenance  Topic Date Due  . OPHTHALMOLOGY EXAM  Never done  . TETANUS/TDAP  09/22/2017  . PNA vac Low Risk Adult (2 of 2 - PPSV23) 08/14/2020  . HEMOGLOBIN A1C  11/08/2020  . INFLUENZA VACCINE  04/22/2021  . FOOT EXAM  05/08/2021  . COLONOSCOPY (Pts 45-78yrs Insurance coverage will need to be confirmed)  03/09/2022  . COVID-19 Vaccine  Completed  . Hepatitis C Screening  Completed  . HPV VACCINES  Aged Out    Discussed health benefits of physical activity, and encouraged him to engage in regular exercise appropriate for his age and condition.   No orders of the defined types were placed in this encounter.   Current Outpatient Medications:  .  amLODipine (NORVASC) 5 MG tablet, TAKE 1 TABLET BY MOUTH EVERY DAY, Disp: 90 tablet, Rfl: 3 .  COVID-19 mRNA vaccine, Pfizer, 30 MCG/0.3ML injection, TO BE ADMINISTERED BY THE PHARMACIST, Disp: .3 mL, Rfl: 0 .  fenofibrate 160 MG tablet, TAKE 1 TABLET BY MOUTH EVERY DAY, Disp: 90 tablet, Rfl: 1 .  ibuprofen (ADVIL) 400 MG tablet, Take 400 mg by mouth every 6 (six) hours as needed., Disp: , Rfl:  .  icosapent Ethyl (VASCEPA) 1 g capsule, Take 2 capsules (2 g total) by mouth 2 (two) times daily., Disp: 120 capsule, Rfl: 1 .  influenza vaccine adjuvanted (FLUAD) 0.5 ML injection, TO BE ADMINISTERED BY PHARMACIST, Disp: .5 mL, Rfl: 0 .  latanoprost (XALATAN) 0.005 % ophthalmic solution, Place 1 drop into the left eye at bedtime. , Disp: , Rfl: 11 .  lisinopril-hydrochlorothiazide (ZESTORETIC) 20-25  MG tablet, TAKE 1  TABLET BY MOUTH EVERY DAY, Disp: 90 tablet, Rfl: 1 .  metFORMIN (GLUCOPHAGE) 500 MG tablet, TAKE 1 TABLET BY MOUTH TWICE A DAY WITH A MEAL NEEDS OFFICE VISIT AND LABS FOR REFILLS, Disp: 180 tablet, Rfl: 0 .  omeprazole (PRILOSEC) 20 MG capsule, Take 20 mg by mouth every morning. , Disp: , Rfl:  .  pravastatin (PRAVACHOL) 40 MG tablet, TAKE 1 TABLET BY MOUTH EVERY DAY, Disp: 90 tablet, Rfl: 1 .  timolol (TIMOPTIC) 0.5 % ophthalmic solution, Place 1 drop into the left eye 2 (two) times daily., Disp: , Rfl:  Medications Discontinued During This Encounter  Medication Reason  . predniSONE (DELTASONE) 20 MG tablet Completed Course  . senna-docusate (SENOKOT-S) 8.6-50 MG tablet Completed Course  . 0.9 %  sodium chloride infusion     Next Medicare Wellness Visit in 12+ months   This visit was completed via telephone due to the restrictions of the COVID-19 pandemic. All issues as above were discussed and addressed but no physical exam was performed. If it was felt that the patient should be evaluated in the office, they were directed there. The patient verbally consented to this visit. Patient was unable to complete an audio/visual visit due to Lack of equipment. . Location of the patient: home . Location of the provider: work . Those involved with this call:  . Provider: Noemi Chapel, DNP, FNP-C . CMA: n/a . Front Desk/Registration: Vevelyn Pat  . Time spent on call: 18 minutes on the phone discussing health concerns. 15 minutes total spent in review of patient's record and preparation of their chart.  I verified patient identity using two factors (patient name and date of birth). Patient consents verbally to being seen via telemedicine visit today.

## 2021-04-03 ENCOUNTER — Telehealth: Payer: Self-pay | Admitting: *Deleted

## 2021-04-03 MED ORDER — PREDNISONE 20 MG PO TABS: 40.0000 mg | ORAL_TABLET | Freq: Every day | ORAL | 0 refills | Status: AC

## 2021-04-03 NOTE — Telephone Encounter (Signed)
Spoke with NP and new orders given for prednisone taper. Prescription sent to pharmacy.   Call placed to patient and patient daughter Carolee Rota made aware. Appointment scheduled for next week to F/U gout and discuss preventative measures.

## 2021-04-03 NOTE — Telephone Encounter (Signed)
Agree with plan 

## 2021-04-03 NOTE — Telephone Encounter (Signed)
Received call from patient [name] [phone]~ telephone.   Reports that patient is voicing C/O pain in foot and wrist. States that patient has Hx of gout, but only noted to have had x1 episode while under care of [facility]. Last uric acid noted at 8.8 in December 2021.  Agreeable to bringing patient in to discuss preventative medications at a later date.   Requested order for prednisone taper for flare. Please advise.

## 2021-04-12 ENCOUNTER — Ambulatory Visit (INDEPENDENT_AMBULATORY_CARE_PROVIDER_SITE_OTHER): Payer: 59 | Admitting: Family Medicine

## 2021-04-12 ENCOUNTER — Encounter: Payer: Self-pay | Admitting: Family Medicine

## 2021-04-12 ENCOUNTER — Other Ambulatory Visit: Payer: Self-pay

## 2021-04-12 VITALS — BP 132/88 | HR 80 | Temp 97.9°F | Resp 14 | Ht 72.0 in | Wt 275.0 lb

## 2021-04-12 DIAGNOSIS — M109 Gout, unspecified: Secondary | ICD-10-CM

## 2021-04-12 MED ORDER — COLCHICINE 0.6 MG PO TABS: 0.6000 mg | ORAL_TABLET | Freq: Every day | ORAL | 0 refills | Status: DC

## 2021-04-12 MED ORDER — ALLOPURINOL 300 MG PO TABS: 300.0000 mg | ORAL_TABLET | Freq: Every day | ORAL | 6 refills | Status: DC

## 2021-04-12 NOTE — Progress Notes (Signed)
Subjective:    Patient ID: [name], male    DOB: Jan 19, 1951, 70 y.o.   MRN: [id number]  HPI Patient is overdue for a physical exam and diabetes check.  He recently had a gout exacerbation.  It began in his foot.  He subsequently had another flare that involved his right wrist.  He was given a prednisone taper pack.  The pain has improved in his wrist and in his foot however he is interested in options to help prevent gout.  He is long overdue for fasting lab work.  Uric acid level in December was 8.8 No visits with results within 1 Month(s) from this visit.  Latest known visit with results is:  Office Visit on 08/31/2020  Component Date Value Ref Range Status   Uric Acid, Serum 08/31/2020 8.8 (A) 4.0 - 8.0 mg/dL Final   Comment: Therapeutic target for gout patients: <6.0 mg/dL .     Past Medical History:  Diagnosis Date   Arthritis    Barrett esophagus    Bladder neck obstruction    Colon polyp    Diabetes mellitus without complication (HCC)    Esophageal stricture    GERD (gastroesophageal reflux disease)    Glaucoma    History of hiatal hernia    Hyperlipidemia    Hypertension    Peyronie disease    Prosthetic eye globe    Right eye   Sleep apnea    does not use machine per pt.   Smoker    Wears glasses    Past Surgical History:  Procedure Laterality Date   BACK SURGERY     lumbar decompression and fusion (Dr. Annette Stable) 2019   COLONOSCOPY     DP   ELBOW SURGERY Right    fractured radius surgery     KNEE ARTHROSCOPY N/A    NASAL SINUS SURGERY     after injury has a metal plate   NESBIT PROCEDURE N/A 12/21/2019   Procedure: NESBIT PROCEDURE PENILE PLICATION AND PENILE BLOCK;  Surgeon: Alexis Frock, MD;  Location: [facility];  Service: Urology;  Laterality: N/A;  1 HR   POLYPECTOMY     TONSILLECTOMY     removed as child unsure of age   TOTAL KNEE ARTHROPLASTY Right    traumatic eye enucleation  2011   right artifical eye   Current  Outpatient Medications on File Prior to Visit  Medication Sig Dispense Refill   amLODipine (NORVASC) 5 MG tablet TAKE 1 TABLET BY MOUTH EVERY DAY 90 tablet 3   fenofibrate 160 MG tablet TAKE 1 TABLET BY MOUTH EVERY DAY 90 tablet 1   ibuprofen (ADVIL) 400 MG tablet Take 400 mg by mouth every 6 (six) hours as needed.     icosapent Ethyl (VASCEPA) 1 g capsule Take 2 capsules (2 g total) by mouth 2 (two) times daily. 120 capsule 1   latanoprost (XALATAN) 0.005 % ophthalmic solution Place 1 drop into the left eye at bedtime.   11   lisinopril-hydrochlorothiazide (ZESTORETIC) 20-25 MG tablet TAKE 1 TABLET BY MOUTH EVERY DAY 90 tablet 1   metFORMIN (GLUCOPHAGE) 500 MG tablet TAKE 1 TABLET BY MOUTH TWICE A DAY WITH A MEAL NEEDS OFFICE VISIT AND LABS FOR REFILLS 180 tablet 0   omeprazole (PRILOSEC) 20 MG capsule Take 20 mg by mouth every morning.      pravastatin (PRAVACHOL) 40 MG tablet TAKE 1 TABLET BY MOUTH EVERY DAY 90 tablet 1   timolol (TIMOPTIC) 0.5 %  ophthalmic solution Place 1 drop into the left eye 2 (two) times daily.     No current facility-administered medications on file prior to visit.   No Known Allergies Social History   Socioeconomic History   Marital status: Divorced    Spouse name: Not on file   Number of children: Not on file   Years of education: Not on file   Highest education level: Not on file  Occupational History   Not on file  Tobacco Use   Smoking status: Former    Packs/day: 0.25    Types: Cigarettes   Smokeless tobacco: Never   Tobacco comments:    quit Jan. 2021  Vaping Use   Vaping Use: Never used  Substance and Sexual Activity   Alcohol use: Yes    Comment: weekends beer= 3-4    Drug use: No   Sexual activity: Yes  Other Topics Concern   Not on file  Social History Narrative   Not on file   Social Determinants of Health   Financial Resource Strain: Not on file  Food Insecurity: Not on file  Transportation Needs: Not on file  Physical  Activity: Not on file  Stress: Not on file  Social Connections: Not on file  Intimate Partner Violence: Not on file      Review of Systems     Objective:   Physical Exam Constitutional:      Appearance: He is obese.  Cardiovascular:     Rate and Rhythm: Normal rate and regular rhythm.     Heart sounds: Normal heart sounds. No murmur heard.   No friction rub. No gallop.  Pulmonary:     Effort: Pulmonary effort is normal.     Breath sounds: Normal breath sounds.  Neurological:     Mental Status: He is alert.          Assessment & Plan:  Exacerbation of gout Recommended avoidance of alcohol and meat such as hamburgers hot dog pork and red meat is much as possible.  Also explained to him that shrimp triggers gout exacerbations.  Recommended that he drink plenty of fluids and avoid dehydration.  He would still like to start allopurinol 300 mg p.o. daily in addition to colchicine 0.6 mg daily.  Come back in August fasting for lab work regarding his diabetes and check a uric acid level at that time.  Ideal uric acid level will be less than 6.

## 2021-05-06 ENCOUNTER — Other Ambulatory Visit: Payer: Self-pay | Admitting: Family Medicine

## 2021-05-17 ENCOUNTER — Ambulatory Visit (INDEPENDENT_AMBULATORY_CARE_PROVIDER_SITE_OTHER): Payer: 59 | Admitting: Family Medicine

## 2021-05-17 ENCOUNTER — Other Ambulatory Visit: Payer: Self-pay

## 2021-05-17 ENCOUNTER — Encounter: Payer: Self-pay | Admitting: Family Medicine

## 2021-05-17 VITALS — BP 130/78 | HR 78 | Temp 98.0°F | Resp 16 | Ht 72.0 in | Wt 276.0 lb

## 2021-05-17 DIAGNOSIS — M25562 Pain in left knee: Secondary | ICD-10-CM

## 2021-05-17 DIAGNOSIS — Z23 Encounter for immunization: Secondary | ICD-10-CM

## 2021-05-17 DIAGNOSIS — Z0000 Encounter for general adult medical examination without abnormal findings: Secondary | ICD-10-CM

## 2021-05-17 DIAGNOSIS — E7800 Pure hypercholesterolemia, unspecified: Secondary | ICD-10-CM | POA: Diagnosis not present

## 2021-05-17 DIAGNOSIS — Z125 Encounter for screening for malignant neoplasm of prostate: Secondary | ICD-10-CM

## 2021-05-17 DIAGNOSIS — G8929 Other chronic pain: Secondary | ICD-10-CM

## 2021-05-17 DIAGNOSIS — E1169 Type 2 diabetes mellitus with other specified complication: Secondary | ICD-10-CM | POA: Diagnosis not present

## 2021-05-17 DIAGNOSIS — E118 Type 2 diabetes mellitus with unspecified complications: Secondary | ICD-10-CM

## 2021-05-17 DIAGNOSIS — I10 Essential (primary) hypertension: Secondary | ICD-10-CM

## 2021-05-17 DIAGNOSIS — E785 Hyperlipidemia, unspecified: Secondary | ICD-10-CM

## 2021-05-17 MED ORDER — METFORMIN HCL 500 MG PO TABS: 500.0000 mg | ORAL_TABLET | Freq: Two times a day (BID) | ORAL | 0 refills | Status: DC

## 2021-05-17 NOTE — Progress Notes (Signed)
Subjective:    Patient ID: [name], male    DOB: 1951-01-17, 70 y.o.   MRN: [id number]  Patient is a very pleasant 70 year old African-American gentleman who is here today for complete physical exam.  He continues to have an elevated weight at 276 pounds with a BMI of 37.  He is not engaging in any regular exercise but he attributes this due to chronic pain in his left knee.  He would like to see an orthopedist to discuss treatment options as he feels that this knee pain is keeping him from exercising.  He also admits to eating a diet with more than 2000 cal a day.  He drinks beer.  He denies drinking any sodas however he does have excessive calories in his beverages.  He also admits to eating a lot at night before going to bed.  His last colonoscopy was in 2020.  They found 4 tubular adenomas and recommended a repeat colonoscopy next year in 2023.  He is due for a PSA to screen for prostate cancer.  He is due for Shingrix, flu shot, and a booster on Pneumovax 23.  He denies any falls or depression or memory loss.  He was never able to start Vascepa last year for hypertriglyceridemia.  He is still taking fenofibrate and pravastatin and denies any myalgias on the medication.  Unfortunately he is smoking again  Past Medical History:  Diagnosis Date   Arthritis    Barrett esophagus    Bladder neck obstruction    Colon polyp    Diabetes mellitus without complication (HCC)    Esophageal stricture    GERD (gastroesophageal reflux disease)    Glaucoma    History of hiatal hernia    Hyperlipidemia    Hypertension    Peyronie disease    Prosthetic eye globe    Right eye   Sleep apnea    does not use machine per pt.   Smoker    Wears glasses    Past Surgical History:  Procedure Laterality Date   BACK SURGERY     lumbar decompression and fusion (Dr. Annette Stable) 2019   COLONOSCOPY     DP   ELBOW SURGERY Right    fractured radius surgery     KNEE ARTHROSCOPY N/A    NASAL SINUS SURGERY      after injury has a metal plate   NESBIT PROCEDURE N/A 12/21/2019   Procedure: NESBIT PROCEDURE PENILE PLICATION AND PENILE BLOCK;  Surgeon: Alexis Frock, MD;  Location: [facility];  Service: Urology;  Laterality: N/A;  1 HR   POLYPECTOMY     TONSILLECTOMY     removed as child unsure of age   TOTAL KNEE ARTHROPLASTY Right    traumatic eye enucleation  2011   right artifical eye   Current Outpatient Medications on File Prior to Visit  Medication Sig Dispense Refill   amLODipine (NORVASC) 5 MG tablet TAKE 1 TABLET BY MOUTH EVERY DAY 90 tablet 3   fenofibrate 160 MG tablet TAKE 1 TABLET BY MOUTH EVERY DAY 90 tablet 1   ibuprofen (ADVIL) 400 MG tablet Take 400 mg by mouth every 6 (six) hours as needed.     latanoprost (XALATAN) 0.005 % ophthalmic solution Place 1 drop into the left eye at bedtime.   11   lisinopril-hydrochlorothiazide (ZESTORETIC) 20-25 MG tablet TAKE 1 TABLET BY MOUTH EVERY DAY 90 tablet 1   omeprazole (PRILOSEC) 20 MG capsule Take 20 mg by mouth every  morning.      pravastatin (PRAVACHOL) 40 MG tablet TAKE 1 TABLET BY MOUTH EVERY DAY 90 tablet 1   timolol (TIMOPTIC) 0.5 % ophthalmic solution Place 1 drop into the left eye 2 (two) times daily.     No current facility-administered medications on file prior to visit.   No Known Allergies Social History   Socioeconomic History   Marital status: Divorced    Spouse name: Not on file   Number of children: Not on file   Years of education: Not on file   Highest education level: Not on file  Occupational History   Not on file  Tobacco Use   Smoking status: Former    Packs/day: 0.25    Types: Cigarettes   Smokeless tobacco: Never   Tobacco comments:    quit Jan. 2021  Vaping Use   Vaping Use: Never used  Substance and Sexual Activity   Alcohol use: Yes    Comment: weekends beer= 3-4    Drug use: No   Sexual activity: Yes  Other Topics Concern   Not on file  Social History Narrative   Not on  file   Social Determinants of Health   Financial Resource Strain: Not on file  Food Insecurity: Not on file  Transportation Needs: Not on file  Physical Activity: Not on file  Stress: Not on file  Social Connections: Not on file  Intimate Partner Violence: Not on file      Review of Systems  All other systems reviewed and are negative.     Objective:   Physical Exam Vitals reviewed.  Constitutional:      Appearance: He is well-developed.  HENT:     Head: Normocephalic and atraumatic.     Right Ear: External ear normal.     Left Ear: External ear normal.     Nose: Nose normal.     Mouth/Throat:     Pharynx: No oropharyngeal exudate.  Eyes:     Conjunctiva/sclera: Conjunctivae normal.   Neck:     Thyroid: No thyromegaly.     Vascular: No JVD.     Trachea: No tracheal deviation.  Cardiovascular:     Rate and Rhythm: Normal rate and regular rhythm.     Heart sounds: Normal heart sounds. No murmur heard. Pulmonary:     Effort: Pulmonary effort is normal. No respiratory distress.     Breath sounds: Normal breath sounds. No stridor. No wheezing or rales.  Abdominal:     General: Bowel sounds are normal. There is no distension.     Palpations: Abdomen is soft. There is no mass.     Tenderness: There is no abdominal tenderness. There is no guarding or rebound.  Musculoskeletal:     Cervical back: Neck supple.  Lymphadenopathy:     Cervical: No cervical adenopathy.  Neurological:     Mental Status: He is alert and oriented to person, place, and time.     Cranial Nerves: No cranial nerve deficit.     Motor: No abnormal muscle tone.     Coordination: Coordination normal.     Deep Tendon Reflexes: Reflexes are normal and symmetric. Reflexes normal.  Psychiatric:        Behavior: Behavior normal.        Thought Content: Thought content normal.        Judgment: Judgment normal.          Assessment & Plan:  Encounter for annual wellness exam in Medicare  patient  Hyperlipidemia associated with type 2 diabetes mellitus (HCC)  Benign essential HTN  Pure hypercholesterolemia  Diabetes mellitus type 2 with complications ([place]) - Plan: CBC with Differential/Platelet, COMPLETE METABOLIC PANEL WITH GFR, Lipid panel, Microalbumin, urine, Hemoglobin A1c  Prostate cancer screening - Plan: PSA  Chronic pain of left knee - Plan: Ambulatory referral to Orthopedic Surgery  Strongly recommended smoking cessation.  Patient's blood pressure today is well controlled.  Check a CMP and a fasting lipid panel.  Goal LDL cholesterol is less than 100 due to his diabetes.  Check hemoglobin A1c.  Goal hemoglobin A1c is less than 6.5.  Recommended the patient try to get 30 minutes a day 5 days a week of aerobic exercise regarding his blood sugar as well as his deconditioning.  Colonoscopy is up-to-date.  Recommended repeat colonoscopy in 2023.  Recommended a booster on Pneumovax 23.  Recommended Shingrix and a flu shot when available.  Consult orthopedic surgery due to chronic left knee pain due to osteoarthritis.

## 2021-05-17 NOTE — Addendum Note (Signed)
Addended by: Sheral Flow on: 05/17/2021 11:05 AM   Modules accepted: Orders

## 2021-05-18 LAB — CBC WITH DIFFERENTIAL/PLATELET
Absolute Monocytes: 576 cells/uL (ref 200–950)
Basophils Absolute: 40 cells/uL (ref 0–200)
Basophils Relative: 0.7 %
Eosinophils Absolute: 371 cells/uL (ref 15–500)
Eosinophils Relative: 6.5 %
HCT: 39.3 % (ref 38.5–50.0)
Hemoglobin: 13.3 g/dL (ref 13.2–17.1)
Lymphs Abs: 2189 cells/uL (ref 850–3900)
MCH: 30.7 pg (ref 27.0–33.0)
MCHC: 33.8 g/dL (ref 32.0–36.0)
MCV: 90.8 fL (ref 80.0–100.0)
MPV: 10.8 fL (ref 7.5–12.5)
Monocytes Relative: 10.1 %
Neutro Abs: 2525 cells/uL (ref 1500–7800)
Neutrophils Relative %: 44.3 %
Platelets: 277 10*3/uL (ref 140–400)
RBC: 4.33 10*6/uL (ref 4.20–5.80)
RDW: 13 % (ref 11.0–15.0)
Total Lymphocyte: 38.4 %
WBC: 5.7 10*3/uL (ref 3.8–10.8)

## 2021-05-18 LAB — LIPID PANEL
Cholesterol: 207 mg/dL — ABNORMAL HIGH (ref <200)
HDL: 40 mg/dL (ref >=40)
LDL Cholesterol (Calc): 112 mg/dL (calc) — ABNORMAL HIGH
Non-HDL Cholesterol (Calc): 167 mg/dL (calc) — ABNORMAL HIGH (ref <130)
Total CHOL/HDL Ratio: 5.2 (calc) — ABNORMAL HIGH (ref <5.0)
Triglycerides: 383 mg/dL — ABNORMAL HIGH (ref <150)

## 2021-05-18 LAB — COMPLETE METABOLIC PANEL WITH GFR
AG Ratio: 1.6 (calc) (ref 1.0–2.5)
ALT: 15 U/L (ref 9–46)
AST: 12 U/L (ref 10–35)
Albumin: 4.5 g/dL (ref 3.6–5.1)
Alkaline phosphatase (APISO): 28 U/L — ABNORMAL LOW (ref 35–144)
BUN: 20 mg/dL (ref 7–25)
CO2: 24 mmol/L (ref 20–32)
Calcium: 9.5 mg/dL (ref 8.6–10.3)
Chloride: 103 mmol/L (ref 98–110)
Creat: 0.98 mg/dL (ref 0.70–1.35)
Globulin: 2.8 g/dL (calc) (ref 1.9–3.7)
Glucose, Bld: 113 mg/dL — ABNORMAL HIGH (ref 65–99)
Potassium: 4 mmol/L (ref 3.5–5.3)
Sodium: 139 mmol/L (ref 135–146)
Total Bilirubin: 0.4 mg/dL (ref 0.2–1.2)
Total Protein: 7.3 g/dL (ref 6.1–8.1)
eGFR: 83 mL/min/{1.73_m2} (ref >=60)

## 2021-05-18 LAB — MICROALBUMIN, URINE: Microalb, Ur: 1.4 mg/dL

## 2021-05-18 LAB — HEMOGLOBIN A1C
Hgb A1c MFr Bld: 6.7 % of total Hgb — ABNORMAL HIGH (ref <5.7)
Mean Plasma Glucose: 146 mg/dL
eAG (mmol/L): 8.1 mmol/L

## 2021-05-18 LAB — PSA: PSA: 1.94 ng/mL (ref <=4.00)

## 2021-05-20 ENCOUNTER — Other Ambulatory Visit: Payer: Self-pay | Admitting: *Deleted

## 2021-05-20 MED ORDER — ICOSAPENT ETHYL 1 G PO CAPS: 2.0000 g | ORAL_CAPSULE | Freq: Two times a day (BID) | ORAL | 3 refills | Status: DC

## 2021-05-31 DIAGNOSIS — H401123 Primary open-angle glaucoma, left eye, severe stage: Secondary | ICD-10-CM | POA: Diagnosis not present

## 2021-06-08 ENCOUNTER — Other Ambulatory Visit: Payer: Self-pay | Admitting: Family Medicine

## 2021-06-13 ENCOUNTER — Other Ambulatory Visit: Payer: Self-pay | Admitting: Family Medicine

## 2021-06-18 ENCOUNTER — Other Ambulatory Visit: Payer: Self-pay | Admitting: Family Medicine

## 2021-07-05 DIAGNOSIS — M25562 Pain in left knee: Secondary | ICD-10-CM | POA: Diagnosis not present

## 2021-07-05 DIAGNOSIS — M1712 Unilateral primary osteoarthritis, left knee: Secondary | ICD-10-CM | POA: Diagnosis not present

## 2021-07-16 ENCOUNTER — Other Ambulatory Visit: Payer: Self-pay | Admitting: Family Medicine

## 2021-07-16 DIAGNOSIS — E785 Hyperlipidemia, unspecified: Secondary | ICD-10-CM

## 2021-07-18 ENCOUNTER — Other Ambulatory Visit: Payer: Self-pay | Admitting: *Deleted

## 2021-07-18 DIAGNOSIS — E1169 Type 2 diabetes mellitus with other specified complication: Secondary | ICD-10-CM

## 2021-07-18 DIAGNOSIS — Z136 Encounter for screening for cardiovascular disorders: Secondary | ICD-10-CM

## 2021-07-18 DIAGNOSIS — Z13220 Encounter for screening for lipoid disorders: Secondary | ICD-10-CM

## 2021-07-18 DIAGNOSIS — I10 Essential (primary) hypertension: Secondary | ICD-10-CM

## 2021-07-18 DIAGNOSIS — E118 Type 2 diabetes mellitus with unspecified complications: Secondary | ICD-10-CM

## 2021-07-18 DIAGNOSIS — E7800 Pure hypercholesterolemia, unspecified: Secondary | ICD-10-CM

## 2021-07-18 DIAGNOSIS — E785 Hyperlipidemia, unspecified: Secondary | ICD-10-CM

## 2021-07-26 ENCOUNTER — Other Ambulatory Visit: Payer: Self-pay | Admitting: Family Medicine

## 2021-07-26 ENCOUNTER — Other Ambulatory Visit: Payer: Self-pay

## 2021-08-25 ENCOUNTER — Other Ambulatory Visit: Payer: Self-pay | Admitting: Family Medicine

## 2021-10-13 ENCOUNTER — Other Ambulatory Visit: Payer: Self-pay | Admitting: Family Medicine

## 2021-10-15 DIAGNOSIS — H31002 Unspecified chorioretinal scars, left eye: Secondary | ICD-10-CM | POA: Diagnosis not present

## 2021-10-15 DIAGNOSIS — H5202 Hypermetropia, left eye: Secondary | ICD-10-CM | POA: Diagnosis not present

## 2021-10-15 DIAGNOSIS — H401123 Primary open-angle glaucoma, left eye, severe stage: Secondary | ICD-10-CM | POA: Diagnosis not present

## 2021-10-15 DIAGNOSIS — H2512 Age-related nuclear cataract, left eye: Secondary | ICD-10-CM | POA: Diagnosis not present

## 2021-10-16 ENCOUNTER — Other Ambulatory Visit: Payer: Self-pay | Admitting: Family Medicine

## 2021-10-25 ENCOUNTER — Other Ambulatory Visit: Payer: Self-pay | Admitting: Family Medicine

## 2021-10-25 DIAGNOSIS — E785 Hyperlipidemia, unspecified: Secondary | ICD-10-CM

## 2022-01-29 DIAGNOSIS — H2512 Age-related nuclear cataract, left eye: Secondary | ICD-10-CM | POA: Diagnosis not present

## 2022-01-29 DIAGNOSIS — H401123 Primary open-angle glaucoma, left eye, severe stage: Secondary | ICD-10-CM | POA: Diagnosis not present

## 2022-01-31 ENCOUNTER — Other Ambulatory Visit: Payer: Self-pay | Admitting: Family Medicine

## 2022-01-31 NOTE — Telephone Encounter (Signed)
Received [facility] from [facility] [address] requesting a refill on lisinopril-hydrochlorothiazide (ZESTORETIC) 20-25 MG tablet [[id number]]  ?  Order Details ?Dose, Route, Frequency: As Directed  ?Dispense Quantity: 90 tablet Refills: 0   ?     ?Sig: TAKE 1 TABLET BY MOUTH EVERY DAY  ?     ?Start Date: 10/25/21 End Date: --  ?Written Date: 10/25/21 Expiration Date: 10/25/22  ?Original Order:  lisinopril-hydrochlorothiazide (ZESTORETIC) 20-25 MG tablet [[id number]]  ?Providers ? ?Ordering and Authorizing Provider:   ?Susy Frizzle, MD  ?[address] [place], [place] [address]  ?Phone:  [phone]   Fax:  [phone]  ?DEA #:  [id number]   NPI:  [id number]   ?   ?Ordering User:  Amado Coe, CMA   ?   ? ?Ph[facility] ? [name] [facility] [facility]   ? ?

## 2022-02-03 MED ORDER — LISINOPRIL-HYDROCHLOROTHIAZIDE 20-25 MG PO TABS: 1.0000 | ORAL_TABLET | Freq: Every day | ORAL | 0 refills | Status: DC

## 2022-02-03 NOTE — Telephone Encounter (Signed)
Requested medication (s) are due for refill today: yes ? ?Requested medication (s) are on the active medication list: yes ? ?Last refill:  10/25/21 #90 ? ?Future visit scheduled: yes ? ?Notes to clinic:  needs blood work ? ? ?Requested Prescriptions  ?Pending Prescriptions Disp Refills  ? lisinopril-hydrochlorothiazide (ZESTORETIC) 20-25 MG tablet 90 tablet 0  ?  Sig: Take 1 tablet by mouth daily.  ?  ? Cardiovascular:  ACEI + Diuretic Combos Failed - 02/03/2022  7:44 AM  ?  ?  Failed - Na in normal range and within 180 days  ?  Sodium  ?Date Value Ref Range Status  ?05/17/2021 139 135 - 146 mmol/L Final  ?   ?  ?  Failed - K in normal range and within 180 days  ?  Potassium  ?Date Value Ref Range Status  ?05/17/2021 4.0 3.5 - 5.3 mmol/L Final  ?   ?  ?  Failed - Cr in normal range and within 180 days  ?  Creat  ?Date Value Ref Range Status  ?05/17/2021 0.98 0.70 - 1.35 mg/dL Final  ?   ?  ?  Failed - eGFR is 30 or above and within 180 days  ?  GFR, Est African American  ?Date Value Ref Range Status  ?05/08/2020 90 > OR = 60 mL/min/1.60m Final  ? ?GFR, Est Non African American  ?Date Value Ref Range Status  ?05/08/2020 78 > OR = 60 mL/min/1.768mFinal  ? ?GFR  ?Date Value Ref Range Status  ?07/08/2013 106.04 >60.00 mL/min Final  ? ?eGFR  ?Date Value Ref Range Status  ?05/17/2021 83 > OR = 60 mL/min/1.7332minal  ?  Comment:  ?  The eGFR is based on the CKD-EPI 2021 equation. To calculate  ?the new eGFR from a previous Creatinine or Cystatin C ?result, go to https://www.kidney.org/professionals/ ?kdoqi/gfr%5Fcalculator ?  ?   ?  ?  Failed - Valid encounter within last 6 months  ?  Recent Outpatient Visits   ? ?      ? 8 months ago Encounter for annual wellness exam in Medicare patient  ? [facility], WarCammie McgeeD  ? 9 months ago Exacerbation of gout  ? [facility] Pick[facility], WarCammie McgeeD  ? 1 year ago Encounter for annual wellness exam in Medicare patient  ? [place]  ? 1 year ago Acute bilateral ankle pain  ? [place]P  ? 1 year ago Benign essential HTN  ? [facility] Pick[facility], WarCammie McgeeD  ? ?  ?  ?Future Appointments   ? ?        ? In 4 days Pickard, WarCammie McgeeD [facility]  ? ?  ? ? ?  ?  ?  Passed - Patient is not pregnant  ?  ?  Passed - Last BP in normal range  ?  BP Readings from Last 1 Encounters:  ?05/17/21 130/78  ?   ?  ?  ? ? ? ? ?

## 2022-02-07 ENCOUNTER — Ambulatory Visit (INDEPENDENT_AMBULATORY_CARE_PROVIDER_SITE_OTHER): Payer: 59 | Admitting: Family Medicine

## 2022-02-07 VITALS — BP 130/97 | HR 77 | Temp 97.8°F | Ht 73.0 in | Wt 278.8 lb

## 2022-02-07 DIAGNOSIS — E7800 Pure hypercholesterolemia, unspecified: Secondary | ICD-10-CM | POA: Diagnosis not present

## 2022-02-07 DIAGNOSIS — I10 Essential (primary) hypertension: Secondary | ICD-10-CM | POA: Diagnosis not present

## 2022-02-07 DIAGNOSIS — E781 Pure hyperglyceridemia: Secondary | ICD-10-CM

## 2022-02-07 DIAGNOSIS — E118 Type 2 diabetes mellitus with unspecified complications: Secondary | ICD-10-CM | POA: Diagnosis not present

## 2022-02-07 MED ORDER — AMLODIPINE BESYLATE 10 MG PO TABS: 10.0000 mg | ORAL_TABLET | Freq: Every day | ORAL | 3 refills | Status: DC

## 2022-02-07 MED ORDER — ALLOPURINOL 300 MG PO TABS: 300.0000 mg | ORAL_TABLET | Freq: Every day | ORAL | 2 refills | Status: DC

## 2022-02-07 NOTE — Progress Notes (Signed)
Subjective:    Patient ID: [name], male    DOB: [birth year], 71 y.o.   MRN: [id number]  Patient is a very pleasant 71 year old gentleman history of type II diabetes mellitus, hypertriglyceridemia, dyslipidemia, and hypertension.  He states that he is run out of his gout pills/allopurinol.  He recently had a gout exacerbation in his foot.  He is recovering from this.  His blood pressure today is elevated with a diastolic blood pressure of 97.  He states that he see similar readings at home.  He denies any chest pain shortness of breath or dyspnea on exertion.  He continues to drink approximately 4-6 beers a day and occasionally some moonshine.  He avoids sodas.  He does eat a high carbohydrate diet and he admits that he has not been exercising due to the pain in his knees.  Past Medical History:  Diagnosis Date   Arthritis    Barrett esophagus    Bladder neck obstruction    Colon polyp    Diabetes mellitus without complication (HCC)    Esophageal stricture    GERD (gastroesophageal reflux disease)    Glaucoma    History of hiatal hernia    Hyperlipidemia    Hypertension    Peyronie disease    Prosthetic eye globe    Right eye   Sleep apnea    does not use machine per pt.   Smoker    Wears glasses    Past Surgical History:  Procedure Laterality Date   BACK SURGERY     lumbar decompression and fusion (Dr. 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     icosapent Ethyl (VASCEPA) 1 g capsule Take 2 capsules (2 g total) by mouth 2 (two) times daily. 120 capsule 3   latanoprost (XALATAN) 0.005 % ophthalmic solution Place 1 drop into the left eye at bedtime.   11   lisinopril-hydrochlorothiazide (ZESTORETIC) 20-25 MG tablet Take 1 tablet by mouth daily. 90 tablet 0   metFORMIN (GLUCOPHAGE) 500 MG tablet TAKE 1 TABLET BY MOUTH 2 TIMES DAILY WITH A MEAL. 180 tablet 1   omeprazole (PRILOSEC) 20 MG capsule Take 20 mg by mouth every morning.      pravastatin (PRAVACHOL) 40 MG tablet TAKE 1 TABLET BY MOUTH EVERY DAY 90 tablet 1   timolol (TIMOPTIC) 0.5 % ophthalmic solution Place 1 drop into the left eye 2 (two) times daily.     No current facility-administered medications on file prior to visit.   No Known Allergies Social History   Socioeconomic History  Marital status: Divorced    Spouse name: Not on file   Number of children: Not on file   Years of education: Not on file   Highest education level: Not on file  Occupational History   Not on file  Tobacco Use   Smoking status: Former    Packs/day: 0.25    Types: Cigarettes   Smokeless tobacco: Never   Tobacco comments:    quit Jan. 2021  Vaping Use   Vaping Use: Never used  Substance and Sexual Activity   Alcohol use: Yes    Comment: weekends beer= 3-4    Drug use: No   Sexual activity: Yes  Other Topics Concern   Not on file  Social History Narrative   Not on file   Social Determinants of Health   Financial Resource Strain: Not on file  Food Insecurity: Not on file  Transportation Needs: Not on file  Physical Activity: Not on file  Stress: Not on file  Social Connections: Not on file  Intimate Partner Violence: Not on file      Review of Systems  All  other systems reviewed and are negative.     Objective:   Physical Exam Vitals reviewed.  Constitutional:      Appearance: He is well-developed.  HENT:     Head: Normocephalic and atraumatic.     Right Ear: External ear normal.     Left Ear: External ear normal.     Nose: Nose normal.     Mouth/Throat:     Pharynx: No oropharyngeal exudate.  Eyes:     Conjunctiva/sclera: Conjunctivae normal.   Neck:     Thyroid: No thyromegaly.     Vascular: No JVD.     Trachea: No tracheal deviation.  Cardiovascular:     Rate and Rhythm: Normal rate and regular rhythm.     Heart sounds: Normal heart sounds. No murmur heard. Pulmonary:     Effort: Pulmonary effort is normal. No respiratory distress.     Breath sounds: Normal breath sounds. No stridor. No wheezing or rales.  Abdominal:     General: Bowel sounds are normal. There is no distension.     Palpations: Abdomen is soft. There is no mass.     Tenderness: There is no abdominal tenderness. There is no guarding or rebound.  Musculoskeletal:     Cervical back: Neck supple.  Lymphadenopathy:     Cervical: No cervical adenopathy.  Neurological:     Mental Status: He is alert and oriented to person, place, and time.     Cranial Nerves: No cranial nerve deficit.     Motor: No abnormal muscle tone.     Coordination: Coordination normal.     Deep Tendon Reflexes: Reflexes are normal and symmetric.  Psychiatric:        Behavior: Behavior normal.        Thought Content: Thought content normal.        Judgment: Judgment normal.          Assessment & Plan:  Diabetes mellitus type 2 with complications (HCC) - Plan: Lipid panel, Microalbumin, urine, Hemoglobin A1c, COMPLETE METABOLIC PANEL WITH GFR  Hypertriglyceridemia  Pure hypercholesterolemia  Benign essential HTN Patient's blood pressure today is elevated.  Increase amlodipine to 10 mg a day and continue lisinopril hydrochlorothiazide.  He is unable to afford Vascepa but he is  taking pravastatin and fenofibrate.  Check a fasting lipid panel today.  Goal LDL cholesterol is less than 100.  Goal triglycerides are less than 250.  Check a hemoglobin A1c.  Goal hemoglobin A1c is less than 6.5.  Recommended reducing his consumption of alcohol as I believe this is a large contributor to his gout and his carbohydrate intake.

## 2022-02-08 LAB — LIPID PANEL
Cholesterol: 179 mg/dL (ref <200)
HDL: 43 mg/dL (ref >=40)
LDL Cholesterol (Calc): 84 mg/dL (calc)
Non-HDL Cholesterol (Calc): 136 mg/dL (calc) — ABNORMAL HIGH (ref <130)
Total CHOL/HDL Ratio: 4.2 (calc) (ref <5.0)
Triglycerides: 391 mg/dL — ABNORMAL HIGH (ref <150)

## 2022-02-08 LAB — COMPLETE METABOLIC PANEL WITH GFR
AG Ratio: 1.6 (calc) (ref 1.0–2.5)
ALT: 15 U/L (ref 9–46)
AST: 15 U/L (ref 10–35)
Albumin: 4.2 g/dL (ref 3.6–5.1)
Alkaline phosphatase (APISO): 44 U/L (ref 35–144)
BUN: 18 mg/dL (ref 7–25)
CO2: 24 mmol/L (ref 20–32)
Calcium: 9 mg/dL (ref 8.6–10.3)
Chloride: 102 mmol/L (ref 98–110)
Creat: 0.82 mg/dL (ref 0.70–1.28)
Globulin: 2.7 g/dL (calc) (ref 1.9–3.7)
Glucose, Bld: 105 mg/dL — ABNORMAL HIGH (ref 65–99)
Potassium: 3.6 mmol/L (ref 3.5–5.3)
Sodium: 141 mmol/L (ref 135–146)
Total Bilirubin: 0.4 mg/dL (ref 0.2–1.2)
Total Protein: 6.9 g/dL (ref 6.1–8.1)
eGFR: 94 mL/min/{1.73_m2} (ref >=60)

## 2022-02-08 LAB — HEMOGLOBIN A1C
Hgb A1c MFr Bld: 6.6 % of total Hgb — ABNORMAL HIGH (ref <5.7)
Mean Plasma Glucose: 143 mg/dL
eAG (mmol/L): 7.9 mmol/L

## 2022-04-11 ENCOUNTER — Encounter: Payer: Self-pay | Admitting: Internal Medicine

## 2022-04-25 ENCOUNTER — Other Ambulatory Visit: Payer: Self-pay

## 2022-04-25 MED ORDER — METFORMIN HCL 500 MG PO TABS: 500.0000 mg | ORAL_TABLET | Freq: Two times a day (BID) | ORAL | 1 refills | Status: DC

## 2022-04-25 NOTE — Telephone Encounter (Signed)
Pharmacy faxed a refill request for metFORMIN (GLUCOPHAGE) 500 MG tablet [[id number]]    Order Details Dose, Route, Frequency: As Directed  Dispense Quantity: 180 tablet Refills: 1        Sig: TAKE 1 TABLET BY MOUTH 2 TIMES DAILY WITH A MEAL.       Start Date: 10/17/21 End Date: --  Written Date: 10/17/21 Expiration Date: 10/17/22

## 2022-04-25 NOTE — Telephone Encounter (Signed)
Requested medications are due for refill today.  yes  Requested medications are on the active medications list.  yes  Last refill. 10/17/2021 #180 1 refill  Future visit scheduled.   no  Notes to clinic.  No protocol attached - provider to review.    Requested Prescriptions  Pending Prescriptions Disp Refills   metFORMIN (GLUCOPHAGE) 500 MG tablet 180 tablet 1    Sig: Take 1 tablet (500 mg total) by mouth 2 (two) times daily with a meal.     Endocrinology:  Diabetes - Biguanides Failed - 04/25/2022 11:16 AM      Failed - B12 Level in normal range and within 720 days    No results found for: "VITAMINB12"       Passed - Cr in normal range and within 360 days    Creat  Date Value Ref Range Status  02/07/2022 0.82 0.70 - 1.28 mg/dL Final         Passed - HBA1C is between 0 and 7.9 and within 180 days    Hgb A1c MFr Bld  Date Value Ref Range Status  02/07/2022 6.6 (H) <5.7 % of total Hgb Final    Comment:    For someone without known diabetes, a hemoglobin A1c value of 6.5% or greater indicates that they may have  diabetes and this should be confirmed with a follow-up  test. . For someone with known diabetes, a value <7% indicates  that their diabetes is well controlled and a value  greater than or equal to 7% indicates suboptimal  control. A1c targets should be individualized based on  duration of diabetes, age, comorbid conditions, and  other considerations. . Currently, no consensus exists regarding use of hemoglobin A1c for diagnosis of diabetes for children. .          Passed - eGFR in normal range and within 360 days    GFR, Est African American  Date Value Ref Range Status  05/08/2020 90 > OR = 60 mL/min/1.17m Final   GFR, Est Non African American  Date Value Ref Range Status  05/08/2020 78 > OR = 60 mL/min/1.749mFinal   GFR  Date Value Ref Range Status  07/08/2013 106.04 >60.00 mL/min Final   eGFR  Date Value Ref Range Status  02/07/2022 94 > OR =  60 mL/min/1.7330minal    Comment:    The eGFR is based on the CKD-EPI 2021 equation. To calculate  the new eGFR from a previous Creatinine or Cystatin C result, go to https://www.kidney.org/professionals/ kdoqi/gfr%5Fcalculator          Passed - Valid encounter within last 6 months    Recent Outpatient Visits           2 months ago Diabetes mellitus type 2 with complications ([place] [facility], WarCammie McgeeD   11 months ago Encounter for annual wellness exam in Medicare patient   BroHolsteinckard, WarCammie McgeeD   1 year ago Exacerbation of gout   [facility]enn[facility] SchaumannarCammie McgeeD   1 year ago Encounter for annual wellness exam in Medicare patient   BroBroomerEulogio BearP   1 year ago Acute bilateral ankle pain   [facility] Mar[facility] BearP              Passed - CBC within normal limits and completed in the last 12 months    WBC  Date Value Ref Range Status  05/17/2021 5.7 3.8 - 10.8 Thousand/uL Final   RBC  Date Value Ref Range Status  05/17/2021 4.33 4.20 - 5.80 Million/uL Final   Hemoglobin  Date Value Ref Range Status  05/17/2021 13.3 13.2 - 17.1 g/dL Final   HCT  Date Value Ref Range Status  05/17/2021 39.3 38.5 - 50.0 % Final   MCHC  Date Value Ref Range Status  05/17/2021 33.8 32.0 - 36.0 g/dL Final   [facility]  Date Value Ref Range Status  05/17/2021 30.7 27.0 - 33.0 pg Final   MCV  Date Value Ref Range Status  05/17/2021 90.8 80.0 - 100.0 fL Final   No results found for: "PLTCOUNTKUC", "LABPLAT", "POCPLA" RDW  Date Value Ref Range Status  05/17/2021 13.0 11.0 - 15.0 % Final

## 2022-05-02 ENCOUNTER — Other Ambulatory Visit: Payer: Self-pay | Admitting: Family Medicine

## 2022-05-02 NOTE — Telephone Encounter (Signed)
Requested Prescriptions  Pending Prescriptions Disp Refills  . lisinopril-hydrochlorothiazide (ZESTORETIC) 20-25 MG tablet [Pharmacy Med Name: LISINOPRIL-HCTZ 20-25 MG TAB] 90 tablet 0    Sig: TAKE 1 TABLET BY MOUTH EVERY DAY     Cardiovascular:  ACEI + Diuretic Combos Failed - 05/02/2022  2:35 AM      Failed - Last BP in normal range    BP Readings from Last 1 Encounters:  02/07/22 (!) 130/97         Passed - Na in normal range and within 180 days    Sodium  Date Value Ref Range Status  02/07/2022 141 135 - 146 mmol/L Final         Passed - K in normal range and within 180 days    Potassium  Date Value Ref Range Status  02/07/2022 3.6 3.5 - 5.3 mmol/L Final         Passed - Cr in normal range and within 180 days    Creat  Date Value Ref Range Status  02/07/2022 0.82 0.70 - 1.28 mg/dL Final         Passed - eGFR is 30 or above and within 180 days    GFR, Est African American  Date Value Ref Range Status  05/08/2020 90 > OR = 60 mL/min/1.73m2 Final   GFR, Est Non African American  Date Value Ref Range Status  05/08/2020 78 > OR = 60 mL/min/1.73m2 Final   GFR  Date Value Ref Range Status  07/08/2013 106.04 >60.00 mL/min Final   eGFR  Date Value Ref Range Status  02/07/2022 94 > OR = 60 mL/min/1.73m2 Final    Comment:    The eGFR is based on the CKD-EPI 2021 equation. To calculate  the new eGFR from a previous Creatinine or Cystatin C result, go to https://www.kidney.org/professionals/ kdoqi/gfr%5Fcalculator          Passed - Patient is not pregnant      Passed - Valid encounter within last 6 months    Recent Outpatient Visits          2 months ago Diabetes mellitus type 2 with complications (HCC)   [facility] Pickard, Warren T, MD   11 months ago Encounter for annual wellness exam in Medicare patient   [facility] Pickard, Warren T, MD   1 year ago Exacerbation of gout   [facility] Pickard, Warren T, MD    1 year ago Encounter for annual wellness exam in Medicare patient   [facility] Martinez, Jessica A, NP   1 year ago Acute bilateral ankle pain   [facility] Martinez, Jessica A, NP               

## 2022-05-23 LAB — HM DIABETES EYE EXAM

## 2022-06-03 DIAGNOSIS — H401123 Primary open-angle glaucoma, left eye, severe stage: Secondary | ICD-10-CM | POA: Diagnosis not present

## 2022-06-03 LAB — HM DIABETES EYE EXAM

## 2022-08-01 ENCOUNTER — Other Ambulatory Visit: Payer: Self-pay | Admitting: Family Medicine

## 2022-08-01 NOTE — Telephone Encounter (Signed)
Per last OV- continue BP medication- RF per protocol Requested Prescriptions  Pending Prescriptions Disp Refills   lisinopril-hydrochlorothiazide (ZESTORETIC) 20-25 MG tablet [Pharmacy Med Name: LISINOPRIL-HCTZ 20-25 MG TAB] 90 tablet 0    Sig: TAKE 1 TABLET BY MOUTH EVERY DAY     Cardiovascular:  ACEI + Diuretic Combos Failed - 08/01/2022  2:28 AM      Failed - Last BP in normal range    BP Readings from Last 1 Encounters:  02/07/22 (!) 130/97         Passed - Na in normal range and within 180 days    Sodium  Date Value Ref Range Status  02/07/2022 141 135 - 146 mmol/L Final         Passed - K in normal range and within 180 days    Potassium  Date Value Ref Range Status  02/07/2022 3.6 3.5 - 5.3 mmol/L Final         Passed - Cr in normal range and within 180 days    Creat  Date Value Ref Range Status  02/07/2022 0.82 0.70 - 1.28 mg/dL Final         Passed - eGFR is 30 or above and within 180 days    GFR, Est African American  Date Value Ref Range Status  05/08/2020 90 > OR = 60 mL/min/1.86m Final   GFR, Est Non African American  Date Value Ref Range Status  05/08/2020 78 > OR = 60 mL/min/1.724mFinal   GFR  Date Value Ref Range Status  07/08/2013 106.04 >60.00 mL/min Final   eGFR  Date Value Ref Range Status  02/07/2022 94 > OR = 60 mL/min/1.7380minal    Comment:    The eGFR is based on the CKD-EPI 2021 equation. To calculate  the new eGFR from a previous Creatinine or Cystatin C result, go to https://www.kidney.org/professionals/ kdoqi/gfr%5Fcalculator          Passed - Patient is not pregnant      Passed - Valid encounter within last 6 months    Recent Outpatient Visits           5 months ago Diabetes mellitus type 2 with complications ([place]CC[place] BroRe[place] Springckard, WarCammie McgeeD   1 year ago Encounter for annual wellness exam in Medicare patient   BroSt. Josephckard, WarCammie McgeeD   1 year ago Exacerbation of gout    BroSmithlandcDennard SchaumannrCammie McgeeD   1 year ago Encounter for annual wellness exam in Medicare patient   BroAmidonrEulogio BearP   1 year ago Acute bilateral ankle pain   BroKayrEulogio BearP

## 2022-08-19 ENCOUNTER — Telehealth: Payer: Self-pay | Admitting: Family Medicine

## 2022-08-19 NOTE — Telephone Encounter (Signed)
  Prescription Request  08/19/2022  Is this a "Controlled Substance" medicine? No  LOV: 04/25/2022   What is the name of the medication or equipment?   timolol (TIMOPTIC) 0.5 % ophthalmic solution   Have you contacted your pharmacy to request a refill? Yes   Which pharmacy would you like this sent to?  [name] [place]: [phone]: [phone]  Patient notified that their request is being sent to the clinical staff for review and that they should receive a response within 2 business days.   Please advise pharmacist at [phone]

## 2022-09-01 ENCOUNTER — Other Ambulatory Visit: Payer: Self-pay

## 2022-09-01 DIAGNOSIS — E785 Hyperlipidemia, unspecified: Secondary | ICD-10-CM

## 2022-09-01 NOTE — Telephone Encounter (Signed)
Prescription Request  09/01/2022  Is this a "Controlled Substance" medicine? No  LOV: 02/07/22  What is the name of the medication or equipment? pravastatin (PRAVACHOL) 40 MG tablet [[id number]]   Have you contacted your pharmacy to request a refill? Yes   Which pharmacy would you like this sent to?  [name]Phone: [phone]: [phone]   Patient notified that their request is being sent to the clinical staff for review and that they should receive a response within 2 business days.   Please advise at [phone]

## 2022-09-02 MED ORDER — PRAVASTATIN SODIUM 40 MG PO TABS: 40.0000 mg | ORAL_TABLET | Freq: Every day | ORAL | 0 refills | Status: DC

## 2022-09-02 NOTE — Telephone Encounter (Signed)
Requested Prescriptions  Pending Prescriptions Disp Refills   pravastatin (PRAVACHOL) 40 MG tablet 90 tablet 0    Sig: Take 1 tablet (40 mg total) by mouth daily.     Cardiovascular:  Antilipid - Statins Failed - 09/01/2022 10:26 AM      Failed - Lipid Panel in normal range within the last 12 months    Cholesterol  Date Value Ref Range Status  02/07/2022 179 <200 mg/dL Final   LDL Cholesterol (Calc)  Date Value Ref Range Status  02/07/2022 84 mg/dL (calc) Final    Comment:    Reference range: <100 . Desirable range <100 mg/dL for primary prevention;   <70 mg/dL for patients with CHD or diabetic patients  with > or = 2 CHD risk factors. Marland Kitchen LDL-C is now calculated using the Martin-Hopkins  calculation, which is a validated novel method providing  better accuracy than the Friedewald equation in the  estimation of LDL-C.  Cresenciano Genre et al. Annamaria Helling. 8295;621(30): 2061-2068  (http://education.[employer].com/faq/FAQ164)    Direct LDL  Date Value Ref Range Status  08/29/2013 84.7 mg/dL Final    Comment:    Optimal:  <100 mg/dLNear or Above Optimal:  100-129 mg/dLBorderline High:  130-159 mg/dLHigh:  160-189 mg/dLVery High:  >190 mg/dL   HDL  Date Value Ref Range Status  02/07/2022 43 > OR = 40 mg/dL Final   Triglycerides  Date Value Ref Range Status  02/07/2022 391 (H) <150 mg/dL Final    Comment:    . If a non-fasting specimen was collected, consider repeat triglyceride testing on a fasting specimen if clinically indicated.  Yates Decamp et al. J. of Clin. Lipidol. 8657;8:469-629. .    Triglyceride fasting, serum  Date Value Ref Range Status  08/26/2006 64 0 - 149 mg/dL Final    Comment:    See lab report for associated comment(s)         Passed - Patient is not pregnant      Passed - Valid encounter within last 12 months    Recent Outpatient Visits           6 months ago Diabetes mellitus type 2 with complications ([place])   [place] Pickard,  Cammie Mcgee, MD   1 year ago Encounter for annual wellness exam in Medicare patient   [place] Pickard, Cammie Mcgee, MD   1 year ago Exacerbation of gout   [place] Dennard Schaumann Cammie Mcgee, MD   1 year ago Encounter for annual wellness exam in Medicare patient   [place] Eulogio Bear, NP   2 years ago Acute bilateral ankle pain   [place] Eulogio Bear, NP

## 2022-09-04 ENCOUNTER — Ambulatory Visit (INDEPENDENT_AMBULATORY_CARE_PROVIDER_SITE_OTHER): Payer: 59

## 2022-09-04 VITALS — BP 122/68 | Ht 73.0 in | Wt 277.6 lb

## 2022-09-04 DIAGNOSIS — Z0000 Encounter for general adult medical examination without abnormal findings: Secondary | ICD-10-CM | POA: Diagnosis not present

## 2022-09-04 NOTE — Progress Notes (Signed)
Subjective:   [name] is a 71 y.o. male who presents for Medicare Annual/Subsequent preventive examination.  Review of Systems     Cardiac Risk Factors include: advanced age (>31mn, >>30women);diabetes mellitus;dyslipidemia;hypertension;male gender     Objective:    Today's Vitals   09/04/22 1541  BP: 122/68  Weight: 277 lb 9.6 oz (125.9 kg)  Height: _0  (1.854 m)   Body mass index is 36.62 kg/m.     09/04/2022    3:48 PM 12/21/2019    8:20 AM 07/26/2018   11:50 AM 07/19/2018   10:24 AM 11/18/2017   10:09 AM  Advanced Directives  Does Patient Have a Medical Advance Directive? Yes No No No No  Does patient want to make changes to medical advance directive? No - Patient declined      Would patient like information on creating a medical advance directive?  No - Patient declined No - Patient declined No - Patient declined     Current Medications (verified) Outpatient Encounter Medications as of 09/04/2022  Medication Sig   amLODipine (NORVASC) 10 MG tablet Take 1 tablet (10 mg total) by mouth daily.   fenofibrate 160 MG tablet TAKE 1 TABLET BY MOUTH EVERY DAY   ibuprofen (ADVIL) 400 MG tablet Take 400 mg by mouth every 6 (six) hours as needed.   icosapent Ethyl (VASCEPA) 1 g capsule Take 2 capsules (2 g total) by mouth 2 (two) times daily.   latanoprost (XALATAN) 0.005 % ophthalmic solution Place 1 drop into the left eye at bedtime.    lisinopril-hydrochlorothiazide (ZESTORETIC) 20-25 MG tablet TAKE 1 TABLET BY MOUTH EVERY DAY   metFORMIN (GLUCOPHAGE) 500 MG tablet Take 1 tablet (500 mg total) by mouth 2 (two) times daily with a meal.   omeprazole (PRILOSEC) 20 MG capsule Take 20 mg by mouth every morning.    pravastatin (PRAVACHOL) 40 MG tablet Take 1 tablet (40 mg total) by mouth daily.   timolol (TIMOPTIC) 0.5 % ophthalmic solution Place 1 drop into the left eye 2 (two) times daily.   allopurinol (ZYLOPRIM) 300 MG tablet Take 1 tablet (300 mg total) by mouth  daily. (Patient not taking: Reported on 09/04/2022)   No facility-administered encounter medications on file as of 09/04/2022.    Allergies (verified) Patient has no known allergies.   History: Past Medical History:  Diagnosis Date   Arthritis    Barrett esophagus    Bladder neck obstruction    Colon polyp    Diabetes mellitus without complication (HCC)    Esophageal stricture    GERD (gastroesophageal reflux disease)    Glaucoma    History of hiatal hernia    Hyperlipidemia    Hypertension    Peyronie disease    Prosthetic eye globe    Right eye   Sleep apnea    does not use machine per pt.   Smoker    Wears glasses    Past Surgical History:  Procedure Laterality Date   BACK SURGERY     lumbar decompression and fusion (Dr. PAnnette Stable 2019   COLONOSCOPY     DP   ELBOW SURGERY Right    fractured radius surgery     KNEE ARTHROSCOPY N/A    NASAL SINUS SURGERY     after injury has a metal plate   NESBIT PROCEDURE N/A 12/21/2019   Procedure: NESBIT PROCEDURE PENILE PLICATION AND PENILE BLOCK;  Surgeon: MAlexis Frock MD;  [facility]  Service: Urology;  Laterality: N/A;  1 HR   POLYPECTOMY     TONSILLECTOMY     removed as child unsure of age   TOTAL KNEE ARTHROPLASTY Right    traumatic eye enucleation  2011   right artifical eye   Family History  Problem Relation Age of Onset   Colon cancer Brother 56   Cancer Father        lung (smoker)   Colon polyps Neg Hx    Esophageal cancer Neg Hx    Rectal cancer Neg Hx    Stomach cancer Neg Hx    Pancreatic cancer Neg Hx    Liver cancer Neg Hx    Prostate cancer Neg Hx    Social History   Socioeconomic History   Marital status: Divorced    Spouse name: Not on file   Number of children: Not on file   Years of education: Not on file   Highest education level: Not on file  Occupational History   Not on file  Tobacco Use   Smoking status: Former    Packs/day: 0.25    Types: Cigarettes    Smokeless tobacco: Never   Tobacco comments:    quit Jan. 2021  Vaping Use   Vaping Use: Never used  Substance and Sexual Activity   Alcohol use: Yes    Comment: weekends beer= 3-4    Drug use: No   Sexual activity: Yes  Other Topics Concern   Not on file  Social History Narrative   Not on file   Social Determinants of Health   Financial Resource Strain: Low Risk  (09/04/2022)   Overall Financial Resource Strain (CARDIA)    Difficulty of Paying Living Expenses: Not hard at all  Food Insecurity: No Food Insecurity (09/04/2022)   Hunger Vital Sign    Worried About Running Out of Food in the Last Year: Never true    Ran Out of Food in the Last Year: Never true  Transportation Needs: No Transportation Needs (09/04/2022)   PRAPARE - Hydrologist (Medical): No    Lack of Transportation (Non-Medical): No  Physical Activity: Inactive (09/04/2022)   Exercise Vital Sign    Days of Exercise per Week: 0 days    Minutes of Exercise per Session: 0 min  Stress: No Stress Concern Present (09/04/2022)   [place]    Feeling of Stress : Not at all  Social Connections: Moderately Integrated (09/04/2022)   Social Connection and Isolation Panel [NHANES]    Frequency of Communication with Friends and Family: More than three times a week    Frequency of Social Gatherings with Friends and Family: Three times a week    Attends Religious Services: More than 4 times per year    Active Member of Clubs or Organizations: Yes    Attends Archivist Meetings: More than 4 times per year    Marital Status: Divorced    Tobacco Counseling Counseling given: Not Answered Tobacco comments: quit Jan. 2021   Clinical Intake:  Pre-visit preparation completed: Yes  Pain : No/denies pain     Diabetes: Yes CBG done?: No Did pt. bring in CBG monitor from home?: No  How often do you need to have  someone help you when you read instructions, pamphlets, or other written materials from your doctor or pharmacy?: 1 - Never  Diabetic?Yes Nutrition Risk Assessment:  Has the patient had any N/V/D within the last 2  months?  No  Does the patient have any non-healing wounds?  No  Has the patient had any unintentional weight loss or weight gain?  No   Diabetes:  Is the patient diabetic?  Yes  If diabetic, was a CBG obtained today?  No  Did the patient bring in their glucometer from home?  No  How often do you monitor your CBG's? Does not monitor .   Financial Strains and Diabetes Management:  Are you having any financial strains with the device, your supplies or your medication? No .  Does the patient want to be seen by Chronic Care Management for management of their diabetes?  No  Would the patient like to be referred to a Nutritionist or for Diabetic Management?  No   Diabetic Exams:  Diabetic Eye Exam: Completed with Dr. Satira Sark; will request records  Diabetic Foot Exam: Completed at next office visit     Interpreter Needed?: No  Information entered by :: Denman George LPN   Activities of Daily Living    09/04/2022    3:48 PM  In your present state of health, do you have any difficulty performing the following activities:  Hearing? 0  Vision? 0  Difficulty concentrating or making decisions? 0  Walking or climbing stairs? 0  Dressing or bathing? 0  Doing errands, shopping? 0  Preparing Food and eating ? N  Using the Toilet? N  In the past six months, have you accidently leaked urine? N  Do you have problems with loss of bowel control? N  Managing your Medications? N  Managing your Finances? N  Housekeeping or managing your Housekeeping? N    Patient Care Team: Susy Frizzle, MD as PCP - General (Family Medicine) Marygrace Drought, MD as Consulting Physician (Ophthalmology)  Indicate any recent Medical Services you may have received from other than [facility]  providers in the past year (date may be approximate).     Assessment:   This is a routine wellness examination for [place].  Hearing/Vision screen Hearing Screening - Comments:: No concerns  Vision Screening - Comments:: Wears rx glasses - up to date with routine eye exams with Dr. Satira Sark   Dietary issues and exercise activities discussed: Current Exercise Habits: The patient does not participate in regular exercise at present   Goals Addressed   None   Depression Screen    09/04/2022    3:53 PM 02/07/2022   10:20 AM 01/16/2021   12:53 PM 07/22/2019   10:38 AM 10/02/2017    8:03 AM 07/02/2017    8:18 AM 07/08/2013    8:43 AM  PHQ 2/9 Scores  PHQ - 2 Score 0 0 0 0 0 0 0  PHQ- 9 Score  0         Fall Risk    09/04/2022    3:46 PM 01/16/2021   12:53 PM 08/31/2020    2:55 PM 07/22/2019   10:38 AM 04/20/2019    5:38 PM  [place] in the past year? 0 0 0 0 0  Comment     Emmi Telephone Survey: data to providers prior to load  Number falls in past yr: 0 0 0    Injury with Fall? 0 0 0    Risk for fall due to : No Fall Risks      Follow up Falls evaluation completed;Education provided;Falls prevention discussed   Falls evaluation completed     FALL RISK PREVENTION PERTAINING TO THE HOME:  Any  stairs in or around the home? Yes  If so, are there any without handrails? No  Home free of loose throw rugs in walkways, pet beds, electrical cords, etc? Yes  Adequate lighting in your home to reduce risk of falls? Yes   ASSISTIVE DEVICES UTILIZED TO PREVENT FALLS:  Life alert? No  Use of a cane, walker or w/c? No  Grab bars in the bathroom? No  Shower chair or bench in shower? No  Elevated toilet seat or a handicapped toilet? No   TIMED UP AND GO:  Was the test performed? Yes .  Length of time to ambulate 10 feet: 8 sec.   Gait slow and steady without use of assistive device  Cognitive Function:        09/04/2022    3:49 PM 01/16/2021   12:54 PM  6CIT Screen   What Year? 0 points 0 points  What month? 0 points 0 points  What time? 0 points 0 points  Count back from 20 0 points 0 points  Months in reverse 0 points 0 points  Repeat phrase 0 points 4 points  Total Score 0 points 4 points    Immunizations Immunization History  Administered Date(s) Administered   Fluad Quad(high Dose 65+) 07/22/2019, 07/04/2020   PFIZER(Purple Top)SARS-COV-2 Vaccination 10/27/2019, 11/21/2019, 07/04/2020   Pneumococcal Conjugate-13 10/02/2017   Pneumococcal Polysaccharide-23 08/15/2015, 05/17/2021   Td 09/23/2007   Zoster, Live 07/08/2013    TDAP status: Due, Education has been provided regarding the importance of this vaccine. Advised may receive this vaccine at local pharmacy or Health Dept. Aware to provide a copy of the vaccination record if obtained from local pharmacy or Health Dept. Verbalized acceptance and understanding.  Flu Vaccine status: Declined, Education has been provided regarding the importance of this vaccine but patient still declined. Advised may receive this vaccine at local pharmacy or Health Dept. Aware to provide a copy of the vaccination record if obtained from local pharmacy or Health Dept. Verbalized acceptance and understanding.  Pneumococcal vaccine status: Up to date  Covid-19 vaccine status: Information provided on how to obtain vaccines.   Qualifies for Shingles Vaccine? Yes   Zostavax completed No   Shingrix Completed?: No.    Education has been provided regarding the importance of this vaccine. Patient has been advised to call insurance company to determine out of pocket expense if they have not yet received this vaccine. Advised may also receive vaccine at local pharmacy or Health Dept. Verbalized acceptance and understanding.  Screening Tests Health Maintenance  Topic Date Due   Diabetic kidney evaluation - Urine ACR  Never done   Zoster Vaccines- Shingrix (1 of 2) Never done   DTaP/Tdap/Td (2 - Tdap) 09/22/2017    FOOT EXAM  05/08/2021   OPHTHALMOLOGY EXAM  05/16/2021   COLONOSCOPY (Pts 45-33yr Insurance coverage will need to be confirmed)  03/09/2022   INFLUENZA VACCINE  04/22/2022   COVID-19 Vaccine (4 - 2023-24 season) 05/23/2022   HEMOGLOBIN A1C  08/10/2022   Diabetic kidney evaluation - eGFR measurement  02/08/2023   Pneumonia Vaccine 70 Years old  Completed   Hepatitis C Screening  Completed   HPV VACCINES  Aged Out    Health Maintenance  Health Maintenance Due  Topic Date Due   Diabetic kidney evaluation - Urine ACR  Never done   Zoster Vaccines- Shingrix (1 of 2) Never done   DTaP/Tdap/Td (2 - Tdap) 09/22/2017   FOOT EXAM  05/08/2021   OPHTHALMOLOGY EXAM  05/16/2021  COLONOSCOPY (Pts 45-73yr Insurance coverage will need to be confirmed)  03/09/2022   INFLUENZA VACCINE  04/22/2022   COVID-19 Vaccine (4 - 2023-24 season) 05/23/2022   HEMOGLOBIN A1C  08/10/2022    Colorectal cancer screening: Type of screening: Colonoscopy. Completed 03/10/19. Repeat every 3 years  Lung Cancer Screening: (Low Dose CT Chest recommended if Age 70-80years, 30 pack-year currently smoking OR have quit w/in 15years.) does not qualify.   Lung Cancer Screening Referral: n/a  Additional Screening:  Hepatitis C Screening: does qualify; Completed 03/26/16  Vision Screening: Recommended annual ophthalmology exams for early detection of glaucoma and other disorders of the eye. Is the patient up to date with their annual eye exam?  Yes  Who is the provider or what is the name of the office in which the patient attends annual eye exams? Dr. TSatira SarkIf pt is not established with a provider, would they like to be referred to a provider to establish care? No .   Dental Screening: Recommended annual dental exams for proper oral hygiene  Community Resource Referral / Chronic Care Management: CRR required this visit?  No   CCM required this visit?  No      Plan:     I have personally reviewed and noted  the following in the patient's chart:   Medical and social history Use of alcohol, tobacco or illicit drugs  Current medications and supplements including opioid prescriptions. Patient is not currently taking opioid prescriptions. Functional ability and status Nutritional status Physical activity Advanced directives List of other physicians Hospitalizations, surgeries, and ER visits in previous 12 months Vitals Screenings to include cognitive, depression, and falls Referrals and appointments  In addition, I have reviewed and discussed with patient certain preventive protocols, quality metrics, and best practice recommendations. A written personalized care plan for preventive services as well as general preventive health recommendations were provided to patient.     SDenman GeorgeBHuntsville [place]  111/11/1592  Nurse Notes: No concerns

## 2022-09-04 NOTE — Patient Instructions (Signed)
[name] , Thank you for taking time to come for your Medicare Wellness Visit. I appreciate your ongoing commitment to your health goals. Please review the following plan we discussed and let me know if I can assist you in the future.   These are the goals we discussed:  Goals      Quit smoking / using tobacco        This is a list of the screening recommended for you and due dates:  Health Maintenance  Topic Date Due   Yearly kidney health urinalysis for diabetes  Never done   Zoster (Shingles) Vaccine (1 of 2) Never done   DTaP/Tdap/Td vaccine (2 - Tdap) 09/22/2017   Complete foot exam   05/08/2021   Eye exam for diabetics  05/16/2021   Colon Cancer Screening  03/09/2022   Flu Shot  04/22/2022   COVID-19 Vaccine (4 - 2023-24 season) 05/23/2022   Hemoglobin A1C  08/10/2022   Yearly kidney function blood test for diabetes  02/08/2023   Pneumonia Vaccine  Completed   Hepatitis C Screening: USPSTF Recommendation to screen - Ages 36-79 yo.  Completed   HPV Vaccine  Aged Out    Advanced directives: Advance directive discussed with you today. I have provided a copy for you to complete at home and have notarized. Once this is complete please bring a copy in to our office so we can scan it into your chart.   Conditions/risks identified: Aim for 30 minutes of exercise or brisk walking, 6-8 glasses of water, and 5 servings of fruits and vegetables each day.   Next appointment: Follow up in one year for your annual wellness visit.   Preventive Care 54 Years and Older, Male  Preventive care refers to lifestyle choices and visits with your health care provider that can promote health and wellness. What does preventive care include? A yearly physical exam. This is also called an annual well check. Dental exams once or twice a year. Routine eye exams. Ask your health care provider how often you should have your eyes checked. Personal lifestyle choices, including: Daily care of your  teeth and gums. Regular physical activity. Eating a healthy diet. Avoiding tobacco and drug use. Limiting alcohol use. Practicing safe sex. Taking low doses of aspirin every day. Taking vitamin and mineral supplements as recommended by your health care provider. What happens during an annual well check? The services and screenings done by your health care provider during your annual well check will depend on your age, overall health, lifestyle risk factors, and family history of disease. Counseling  Your health care provider may ask you questions about your: Alcohol use. Tobacco use. Drug use. Emotional well-being. Home and relationship well-being. Sexual activity. Eating habits. History of falls. Memory and ability to understand (cognition). Work and work Statistician. Screening  You may have the following tests or measurements: Height, weight, and BMI. Blood pressure. Lipid and cholesterol levels. These may be checked every 5 years, or more frequently if you are over 30 years old. Skin check. Lung cancer screening. You may have this screening every year starting at age 51 if you have a 30-pack-year history of smoking and currently smoke or have quit within the past 15 years. Fecal occult blood test (FOBT) of the stool. You may have this test every year starting at age 71. Flexible sigmoidoscopy or colonoscopy. You may have a sigmoidoscopy every 5 years or a colonoscopy every 10 years starting at age 78. Prostate cancer screening. Recommendations  will vary depending on your family history and other risks. Hepatitis C blood test. Hepatitis B blood test. Sexually transmitted disease (STD) testing. Diabetes screening. This is done by checking your blood sugar (glucose) after you have not eaten for a while (fasting). You may have this done every 1-3 years. Abdominal aortic aneurysm (AAA) screening. You may need this if you are a current or former smoker. Osteoporosis. You may be  screened starting at age 80 if you are at high risk. Talk with your health care provider about your test results, treatment options, and if necessary, the need for more tests. Vaccines  Your health care provider may recommend certain vaccines, such as: Influenza vaccine. This is recommended every year. Tetanus, diphtheria, and acellular pertussis (Tdap, Td) vaccine. You may need a Td booster every 10 years. Zoster vaccine. You may need this after age 40. Pneumococcal 13-valent conjugate (PCV13) vaccine. One dose is recommended after age 63. Pneumococcal polysaccharide (PPSV23) vaccine. One dose is recommended after age 4. Talk to your health care provider about which screenings and vaccines you need and how often you need them. This information is not intended to replace advice given to you by your health care provider. Make sure you discuss any questions you have with your health care provider. Document Released: 10/05/2015 Document Revised: 05/28/2016 Document Reviewed: 07/10/2015 Elsevier Interactive Patient Education  2017 [place] Prevention in the Home Falls can cause injuries. They can happen to people of all ages. There are many things you can do to make your home safe and to help prevent falls. What can I do on the outside of my home? Regularly fix the edges of walkways and driveways and fix any cracks. Remove anything that might make you trip as you walk through a door, such as a raised step or threshold. Trim any bushes or trees on the path to your home. Use bright outdoor lighting. Clear any walking paths of anything that might make someone trip, such as rocks or tools. Regularly check to see if handrails are loose or broken. Make sure that both sides of any steps have handrails. Any raised decks and porches should have guardrails on the edges. Have any leaves, snow, or ice cleared regularly. Use sand or salt on walking paths during winter. Clean up any spills in  your garage right away. This includes oil or grease spills. What can I do in the bathroom? Use night lights. Install grab bars by the toilet and in the tub and shower. Do not use towel bars as grab bars. Use non-skid mats or decals in the tub or shower. If you need to sit down in the shower, use a plastic, non-slip stool. Keep the floor dry. Clean up any water that spills on the floor as soon as it happens. Remove soap buildup in the tub or shower regularly. Attach bath mats securely with double-sided non-slip rug tape. Do not have throw rugs and other things on the floor that can make you trip. What can I do in the bedroom? Use night lights. Make sure that you have a light by your bed that is easy to reach. Do not use any sheets or blankets that are too big for your bed. They should not hang down onto the floor. Have a firm chair that has side arms. You can use this for support while you get dressed. Do not have throw rugs and other things on the floor that can make you trip. What can I do  in the kitchen? Clean up any spills right away. Avoid walking on wet floors. Keep items that you use a lot in easy-to-reach places. If you need to reach something above you, use a strong step stool that has a grab bar. Keep electrical cords out of the way. Do not use floor polish or wax that makes floors slippery. If you must use wax, use non-skid floor wax. Do not have throw rugs and other things on the floor that can make you trip. What can I do with my stairs? Do not leave any items on the stairs. Make sure that there are handrails on both sides of the stairs and use them. Fix handrails that are broken or loose. Make sure that handrails are as long as the stairways. Check any carpeting to make sure that it is firmly attached to the stairs. Fix any carpet that is loose or worn. Avoid having throw rugs at the top or bottom of the stairs. If you do have throw rugs, attach them to the floor with carpet  tape. Make sure that you have a light switch at the top of the stairs and the bottom of the stairs. If you do not have them, ask someone to add them for you. What else can I do to help prevent falls? Wear shoes that: Do not have high heels. Have rubber bottoms. Are comfortable and fit you well. Are closed at the toe. Do not wear sandals. If you use a stepladder: Make sure that it is fully opened. Do not climb a closed stepladder. Make sure that both sides of the stepladder are locked into place. Ask someone to hold it for you, if possible. Clearly mark and make sure that you can see: Any grab bars or handrails. First and last steps. Where the edge of each step is. Use tools that help you move around (mobility aids) if they are needed. These include: Canes. Walkers. Scooters. Crutches. Turn on the lights when you go into a dark area. Replace any light bulbs as soon as they burn out. Set up your furniture so you have a clear path. Avoid moving your furniture around. If any of your floors are uneven, fix them. If there are any pets around you, be aware of where they are. Review your medicines with your doctor. Some medicines can make you feel dizzy. This can increase your chance of falling. Ask your doctor what other things that you can do to help prevent falls. This information is not intended to replace advice given to you by your health care provider. Make sure you discuss any questions you have with your health care provider. Document Released: 07/05/2009 Document Revised: 02/14/2016 Document Reviewed: 10/13/2014 Elsevier Interactive Patient Education  2017 [employer].

## 2022-10-03 DIAGNOSIS — H25012 Cortical age-related cataract, left eye: Secondary | ICD-10-CM | POA: Diagnosis not present

## 2022-10-03 DIAGNOSIS — H2512 Age-related nuclear cataract, left eye: Secondary | ICD-10-CM | POA: Diagnosis not present

## 2022-10-03 DIAGNOSIS — H5203 Hypermetropia, bilateral: Secondary | ICD-10-CM | POA: Diagnosis not present

## 2022-10-03 DIAGNOSIS — H401123 Primary open-angle glaucoma, left eye, severe stage: Secondary | ICD-10-CM | POA: Diagnosis not present

## 2022-10-03 DIAGNOSIS — H31002 Unspecified chorioretinal scars, left eye: Secondary | ICD-10-CM | POA: Diagnosis not present

## 2022-10-03 DIAGNOSIS — H52203 Unspecified astigmatism, bilateral: Secondary | ICD-10-CM | POA: Diagnosis not present

## 2022-10-03 DIAGNOSIS — H524 Presbyopia: Secondary | ICD-10-CM | POA: Diagnosis not present

## 2022-10-03 DIAGNOSIS — H43812 Vitreous degeneration, left eye: Secondary | ICD-10-CM | POA: Diagnosis not present

## 2022-10-09 ENCOUNTER — Encounter: Payer: Self-pay | Admitting: Family Medicine

## 2022-10-16 DIAGNOSIS — H25812 Combined forms of age-related cataract, left eye: Secondary | ICD-10-CM | POA: Diagnosis not present

## 2022-10-16 DIAGNOSIS — H25012 Cortical age-related cataract, left eye: Secondary | ICD-10-CM | POA: Diagnosis not present

## 2022-10-16 DIAGNOSIS — H52222 Regular astigmatism, left eye: Secondary | ICD-10-CM | POA: Diagnosis not present

## 2022-10-16 DIAGNOSIS — H2512 Age-related nuclear cataract, left eye: Secondary | ICD-10-CM | POA: Diagnosis not present

## 2022-10-28 ENCOUNTER — Other Ambulatory Visit: Payer: Self-pay | Admitting: Family Medicine

## 2022-10-29 NOTE — Telephone Encounter (Signed)
Patient needs OV, will refill medication until OV can be made.  Requested Prescriptions  Pending Prescriptions Disp Refills   metFORMIN (GLUCOPHAGE) 500 MG tablet [Pharmacy Med Name: METFORMIN HCL 500 MG TABLET] 180 tablet 0    Sig: TAKE 1 TABLET BY MOUTH 2 TIMES DAILY WITH A MEAL.     Endocrinology:  Diabetes - Biguanides Failed - 10/28/2022  1:37 AM      Failed - HBA1C is between 0 and 7.9 and within 180 days    Hgb A1c MFr Bld  Date Value Ref Range Status  02/07/2022 6.6 (H) <5.7 % of total Hgb Final    Comment:    For someone without known diabetes, a hemoglobin A1c value of 6.5% or greater indicates that they may have  diabetes and this should be confirmed with a follow-up  test. . For someone with known diabetes, a value <7% indicates  that their diabetes is well controlled and a value  greater than or equal to 7% indicates suboptimal  control. A1c targets should be individualized based on  duration of diabetes, age, comorbid conditions, and  other considerations. . Currently, no consensus exists regarding use of hemoglobin A1c for diagnosis of diabetes for children. .          Failed - B12 Level in normal range and within 720 days    No results found for: "VITAMINB12"       Failed - Valid encounter within last 6 months    Recent Outpatient Visits           8 months ago Diabetes mellitus type 2 with complications ([place])   [place] Pickard, Cammie Mcgee, MD   1 year ago Encounter for annual wellness exam in Medicare patient   [place] Susy Frizzle, MD   1 year ago Exacerbation of gout   [place] Susy Frizzle, MD   1 year ago Encounter for annual wellness exam in Medicare patient   [place] Eulogio Bear, NP   2 years ago Acute bilateral ankle pain   [place] Noemi Chapel A, NP              Failed - CBC within normal limits and completed in the  last 12 months    WBC  Date Value Ref Range Status  05/17/2021 5.7 3.8 - 10.8 Thousand/uL Final   RBC  Date Value Ref Range Status  05/17/2021 4.33 4.20 - 5.80 Million/uL Final   Hemoglobin  Date Value Ref Range Status  05/17/2021 13.3 13.2 - 17.1 g/dL Final   HCT  Date Value Ref Range Status  05/17/2021 39.3 38.5 - 50.0 % Final   MCHC  Date Value Ref Range Status  05/17/2021 33.8 32.0 - 36.0 g/dL Final   [facility]  Date Value Ref Range Status  05/17/2021 30.7 27.0 - 33.0 pg Final   MCV  Date Value Ref Range Status  05/17/2021 90.8 80.0 - 100.0 fL Final   No results found for: "PLTCOUNTKUC", "LABPLAT", "POCPLA" RDW  Date Value Ref Range Status  05/17/2021 13.0 11.0 - 15.0 % Final         Passed - Cr in normal range and within 360 days    Creat  Date Value Ref Range Status  02/07/2022 0.82 0.70 - 1.28 mg/dL Final         Passed - eGFR in normal range and within 360 days    GFR,  Est African American  Date Value Ref Range Status  05/08/2020 90 > OR = 60 mL/min/1.52m Final   GFR, Est Non African American  Date Value Ref Range Status  05/08/2020 78 > OR = 60 mL/min/1.764mFinal   GFR  Date Value Ref Range Status  07/08/2013 106.04 >60.00 mL/min Final   eGFR  Date Value Ref Range Status  02/07/2022 94 > OR = 60 mL/min/1.7360minal    Comment:    The eGFR is based on the CKD-EPI 2021 equation. To calculate  the new eGFR from a previous Creatinine or Cystatin C result, go to https://www.kidney.org/professionals/ kdoqi/gfr%5Fcalculator           lisinopril-hydrochlorothiazide (ZESTORETIC) 20-25 MG tablet [Pharmacy Med Name: LISINOPRIL-HCTZ 20-25 MG TAB] 90 tablet 0    Sig: TAKE 1 TABLET BY MOUTH EVERY DAY     Cardiovascular:  ACEI + Diuretic Combos Failed - 10/28/2022  1:37 AM      Failed - Na in normal range and within 180 days    Sodium  Date Value Ref Range Status  02/07/2022 141 135 - 146 mmol/L Final         Failed - K in normal range and within 180  days    Potassium  Date Value Ref Range Status  02/07/2022 3.6 3.5 - 5.3 mmol/L Final         Failed - Cr in normal range and within 180 days    Creat  Date Value Ref Range Status  02/07/2022 0.82 0.70 - 1.28 mg/dL Final         Failed - eGFR is 30 or above and within 180 days    GFR, Est African American  Date Value Ref Range Status  05/08/2020 90 > OR = 60 mL/min/1.5m26mnal   GFR, Est Non African American  Date Value Ref Range Status  05/08/2020 78 > OR = 60 mL/min/1.5m288mal   GFR  Date Value Ref Range Status  07/08/2013 106.04 >60.00 mL/min Final   eGFR  Date Value Ref Range Status  02/07/2022 94 > OR = 60 mL/min/1.5m2 29ml    Comment:    The eGFR is based on the CKD-EPI 2021 equation. To calculate  the new eGFR from a previous Creatinine or Cystatin C result, go to https://www.kidney.org/professionals/ kdoqi/gfr%5Fcalculator          Failed - Valid encounter within last 6 months    Recent Outpatient Visits           8 months ago Diabetes mellitus type 2 with complications (HCC)  [place], WarrenCammie Mcgee 1 year ago Encounter for annual wellness exam in Medicare patient   [place] Mar[place]osard, WarrenCammie Mcgee 1 year ago Exacerbation of gout   [place] Mc[place] 1 year ago Encounter for annual wellness exam in Medicare patient   [place]ulog[place] Bear 2 years ago Acute bilateral ankle pain   [place]            Passed - Patient is not pregnant      Passed - Last BP in normal range    BP Readings from Last 1 Encounters:  09/04/22 122/68

## 2022-12-01 ENCOUNTER — Other Ambulatory Visit: Payer: Self-pay | Admitting: Family Medicine

## 2022-12-01 DIAGNOSIS — E785 Hyperlipidemia, unspecified: Secondary | ICD-10-CM

## 2022-12-02 ENCOUNTER — Encounter: Payer: Self-pay | Admitting: Internal Medicine

## 2022-12-02 ENCOUNTER — Other Ambulatory Visit: Payer: Self-pay

## 2022-12-02 DIAGNOSIS — Z86010 Personal history of colonic polyps: Secondary | ICD-10-CM

## 2022-12-02 DIAGNOSIS — Z1211 Encounter for screening for malignant neoplasm of colon: Secondary | ICD-10-CM

## 2022-12-29 DIAGNOSIS — H2000 Unspecified acute and subacute iridocyclitis: Secondary | ICD-10-CM | POA: Diagnosis not present

## 2023-01-05 DIAGNOSIS — H2000 Unspecified acute and subacute iridocyclitis: Secondary | ICD-10-CM | POA: Diagnosis not present

## 2023-01-11 ENCOUNTER — Other Ambulatory Visit: Payer: Self-pay | Admitting: Family Medicine

## 2023-01-12 ENCOUNTER — Ambulatory Visit (AMBULATORY_SURGERY_CENTER): Payer: Medicare Other | Admitting: *Deleted

## 2023-01-12 ENCOUNTER — Encounter: Payer: Self-pay | Admitting: Internal Medicine

## 2023-01-12 VITALS — Ht 71.0 in | Wt 275.0 lb

## 2023-01-12 DIAGNOSIS — Z800 Family history of malignant neoplasm of digestive organs: Secondary | ICD-10-CM

## 2023-01-12 DIAGNOSIS — Z86010 Personal history of colonic polyps: Secondary | ICD-10-CM

## 2023-01-12 MED ORDER — NA SULFATE-K SULFATE-MG SULF 17.5-3.13-1.6 GM/177ML PO SOLN
1.0000 | Freq: Once | ORAL | 0 refills | Status: AC
Start: 2023-01-12 — End: 2023-01-12

## 2023-01-12 NOTE — Progress Notes (Signed)
No egg or soy allergy known to patient  No issues known to pt with past sedation with any surgeries or procedures Patient denies ever being told they had issues or difficulty with intubation  No FH of Malignant Hyperthermia Pt is not on diet pills Pt is not on  home 02  Pt is not on blood thinners  Pt denies issues with constipation  No A fib or A flutter Have any cardiac testing pending--no Pt instructed to use Singlecare.com or GoodRx for a price reduction on prep    Independent mobility  Patient's chart reviewed by Cathlyn Parsons CNRA prior to previsit and patient appropriate for the [facility].  Previsit completed and red dot placed by patient's name on their procedure day (on provider's schedule).

## 2023-01-19 DIAGNOSIS — H43812 Vitreous degeneration, left eye: Secondary | ICD-10-CM | POA: Diagnosis not present

## 2023-01-29 ENCOUNTER — Other Ambulatory Visit: Payer: Self-pay | Admitting: Family Medicine

## 2023-01-29 NOTE — Telephone Encounter (Signed)
Requested Prescriptions  Pending Prescriptions Disp Refills   lisinopril-hydrochlorothiazide (ZESTORETIC) 20-25 MG tablet [Pharmacy Med Name: LISINOPRIL-HCTZ 20-25 MG TAB] 90 tablet 2    Sig: TAKE 1 TABLET BY MOUTH EVERY DAY     Cardiovascular:  ACEI + Diuretic Combos Failed - 01/29/2023  2:40 AM      Failed - Na in normal range and within 180 days    Sodium  Date Value Ref Range Status  02/07/2022 141 135 - 146 mmol/L Final         Failed - K in normal range and within 180 days    Potassium  Date Value Ref Range Status  02/07/2022 3.6 3.5 - 5.3 mmol/L Final         Failed - Cr in normal range and within 180 days    Creat  Date Value Ref Range Status  02/07/2022 0.82 0.70 - 1.28 mg/dL Final         Failed - eGFR is 30 or above and within 180 days    GFR, Est African American  Date Value Ref Range Status  05/08/2020 90 > OR = 60 mL/min/1.99m2 Final   GFR, Est Non African American  Date Value Ref Range Status  05/08/2020 78 > OR = 60 mL/min/1.81m2 Final   GFR  Date Value Ref Range Status  07/08/2013 106.04 >60.00 mL/min Final   eGFR  Date Value Ref Range Status  02/07/2022 94 > OR = 60 mL/min/1.84m2 Final    Comment:    The eGFR is based on the CKD-EPI 2021 equation. To calculate  the new eGFR from a previous Creatinine or Cystatin C result, go to https://www.kidney.org/professionals/ kdoqi/gfr%5Fcalculator          Failed - Valid encounter within last 6 months    Recent Outpatient Visits           11 months ago Diabetes mellitus type 2 with complications (HCC)   [facility] Pickard, Priscille Heidelberg, MD   1 year ago Encounter for annual wellness exam in Medicare patient   [facility] Tanya Nones, Priscille Heidelberg, MD   1 year ago Exacerbation of gout   [facility] Donita Brooks, MD   2 years ago Encounter for annual wellness exam in Medicare patient   [facility] Valentino Nose, NP   2 years ago Acute  bilateral ankle pain   [facility] Valentino Nose, NP              Passed - Patient is not pregnant      Passed - Last BP in normal range    BP Readings from Last 1 Encounters:  09/04/22 122/68          metFORMIN (GLUCOPHAGE) 500 MG tablet [Pharmacy Med Name: METFORMIN HCL 500 MG TABLET] 180 tablet 2    Sig: TAKE 1 TABLET BY MOUTH TWICE A DAY WITH FOOD     Endocrinology:  Diabetes - Biguanides Failed - 01/29/2023  2:40 AM      Failed - HBA1C is between 0 and 7.9 and within 180 days    Hgb A1c MFr Bld  Date Value Ref Range Status  02/07/2022 6.6 (H) <5.7 % of total Hgb Final    Comment:    For someone without known diabetes, a hemoglobin A1c value of 6.5% or greater indicates that they may have  diabetes and this should be confirmed with a follow-up  test. . For someone  with known diabetes, a value <7% indicates  that their diabetes is well controlled and a value  greater than or equal to 7% indicates suboptimal  control. A1c targets should be individualized based on  duration of diabetes, age, comorbid conditions, and  other considerations. . Currently, no consensus exists regarding use of hemoglobin A1c for diagnosis of diabetes for children. .          Failed - B12 Level in normal range and within 720 days    No results found for: "VITAMINB12"       Failed - Valid encounter within last 6 months    Recent Outpatient Visits           11 months ago Diabetes mellitus type 2 with complications (HCC)   [facility] Pickard, Priscille Heidelberg, MD   1 year ago Encounter for annual wellness exam in Medicare patient   [facility] Donita Brooks, MD   1 year ago Exacerbation of gout   [facility] Donita Brooks, MD   2 years ago Encounter for annual wellness exam in Medicare patient   [facility] Valentino Nose, NP   2 years ago Acute bilateral ankle pain   [facility] Cathlean Marseilles A, NP              Failed - CBC within normal limits and completed in the last 12 months    WBC  Date Value Ref Range Status  05/17/2021 5.7 3.8 - 10.8 Thousand/uL Final   RBC  Date Value Ref Range Status  05/17/2021 4.33 4.20 - 5.80 Million/uL Final   Hemoglobin  Date Value Ref Range Status  05/17/2021 13.3 13.2 - 17.1 g/dL Final   HCT  Date Value Ref Range Status  05/17/2021 39.3 38.5 - 50.0 % Final   MCHC  Date Value Ref Range Status  05/17/2021 33.8 32.0 - 36.0 g/dL Final   [facility]  Date Value Ref Range Status  05/17/2021 30.7 27.0 - 33.0 pg Final   MCV  Date Value Ref Range Status  05/17/2021 90.8 80.0 - 100.0 fL Final   No results found for: "PLTCOUNTKUC", "LABPLAT", "POCPLA" RDW  Date Value Ref Range Status  05/17/2021 13.0 11.0 - 15.0 % Final         Passed - Cr in normal range and within 360 days    Creat  Date Value Ref Range Status  02/07/2022 0.82 0.70 - 1.28 mg/dL Final         Passed - eGFR in normal range and within 360 days    GFR, Est African American  Date Value Ref Range Status  05/08/2020 90 > OR = 60 mL/min/1.6m2 Final   GFR, Est Non African American  Date Value Ref Range Status  05/08/2020 78 > OR = 60 mL/min/1.62m2 Final   GFR  Date Value Ref Range Status  07/08/2013 106.04 >60.00 mL/min Final   eGFR  Date Value Ref Range Status  02/07/2022 94 > OR = 60 mL/min/1.2m2 Final    Comment:    The eGFR is based on the CKD-EPI 2021 equation. To calculate  the new eGFR from a previous Creatinine or Cystatin C result, go to https://www.kidney.org/professionals/ kdoqi/gfr%5Fcalculator

## 2023-02-09 ENCOUNTER — Encounter: Payer: Self-pay | Admitting: Internal Medicine

## 2023-02-09 ENCOUNTER — Ambulatory Visit (AMBULATORY_SURGERY_CENTER): Payer: Medicare Other | Admitting: Internal Medicine

## 2023-02-09 VITALS — BP 114/69 | HR 70 | Temp 98.9°F | Resp 16 | Ht 71.0 in | Wt 275.0 lb

## 2023-02-09 DIAGNOSIS — D120 Benign neoplasm of cecum: Secondary | ICD-10-CM

## 2023-02-09 DIAGNOSIS — D122 Benign neoplasm of ascending colon: Secondary | ICD-10-CM | POA: Diagnosis not present

## 2023-02-09 DIAGNOSIS — Z86010 Personal history of colonic polyps: Secondary | ICD-10-CM | POA: Diagnosis not present

## 2023-02-09 DIAGNOSIS — D123 Benign neoplasm of transverse colon: Secondary | ICD-10-CM

## 2023-02-09 DIAGNOSIS — G4733 Obstructive sleep apnea (adult) (pediatric): Secondary | ICD-10-CM | POA: Diagnosis not present

## 2023-02-09 DIAGNOSIS — Z09 Encounter for follow-up examination after completed treatment for conditions other than malignant neoplasm: Secondary | ICD-10-CM | POA: Diagnosis not present

## 2023-02-09 DIAGNOSIS — K635 Polyp of colon: Secondary | ICD-10-CM | POA: Diagnosis not present

## 2023-02-09 DIAGNOSIS — D124 Benign neoplasm of descending colon: Secondary | ICD-10-CM | POA: Diagnosis not present

## 2023-02-09 DIAGNOSIS — I10 Essential (primary) hypertension: Secondary | ICD-10-CM | POA: Diagnosis not present

## 2023-02-09 DIAGNOSIS — D125 Benign neoplasm of sigmoid colon: Secondary | ICD-10-CM | POA: Diagnosis not present

## 2023-02-09 DIAGNOSIS — E119 Type 2 diabetes mellitus without complications: Secondary | ICD-10-CM | POA: Diagnosis not present

## 2023-02-09 DIAGNOSIS — Z800 Family history of malignant neoplasm of digestive organs: Secondary | ICD-10-CM

## 2023-02-09 MED ORDER — SODIUM CHLORIDE 0.9 % IV SOLN
500.0000 mL | Freq: Once | INTRAVENOUS | Status: DC
Start: 2023-02-09 — End: 2023-02-09

## 2023-02-09 NOTE — Progress Notes (Signed)
Called to room to assist during endoscopic procedure.  Patient ID and intended procedure confirmed with present staff. Received instructions for my participation in the procedure from the performing physician.  

## 2023-02-09 NOTE — Progress Notes (Signed)
Pt awake, alert and oriented. VSS. Airway intact. SBAR complete to RN. All questions answered.   

## 2023-02-09 NOTE — Progress Notes (Signed)
GASTROENTEROLOGY PROCEDURE H&P NOTE   Primary Care Physician: Donita Brooks, MD    Reason for Procedure:  History of multiple adenomatous colon polyps  Plan:    Surveillance colonoscopy  Patient is appropriate for endoscopic procedure(s) in the ambulatory ([facility]) setting.  The nature of the procedure, as well as the risks, benefits, and alternatives were carefully and thoroughly reviewed with the patient. Ample time for discussion and questions allowed. The patient understood, was satisfied, and agreed to proceed.     HPI: [name] is a 72 y.o. male who presents for surveillance colonoscopy.  Medical history as below.  Tolerated the prep.  No recent chest pain or shortness of breath.  No abdominal pain today.  Past Medical History:  Diagnosis Date   Arthritis    Barrett esophagus    Bladder neck obstruction    Cataract    removed in left eye   Colon polyp    Diabetes mellitus without complication (HCC)    Esophageal stricture    GERD (gastroesophageal reflux disease)    Glaucoma    History of hiatal hernia    Hyperlipidemia    Hypertension    Peyronie disease    Prosthetic eye globe    Right eye   Sleep apnea    does not use machine per pt.   Smoker    Wears glasses     Past Surgical History:  Procedure Laterality Date   BACK SURGERY     lumbar decompression and fusion (Dr. 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 Authorizing Provider  allopurinol (ZYLOPRIM) 300 MG tablet TAKE 1 TABLET BY MOUTH EVERY DAY 01/12/23  Yes Donita Brooks, MD  amLODipine (NORVASC) 10 MG tablet Take 1 tablet (10 mg total) by mouth daily. 02/07/22  Yes Donita Brooks, MD  Difluprednate 0.05 % EMUL Place 1 drop into the left eye 2 (two) times daily. 11/21/22  Yes [provider]  fenofibrate 160 MG tablet TAKE 1 TABLET BY MOUTH EVERY DAY 06/18/21  Yes Pickard, Priscille Heidelberg, MD  latanoprost (XALATAN) 0.005 % ophthalmic solution Place 1 drop into the left eye at bedtime.  09/30/17  Yes [provider]  lisinopril-hydrochlorothiazide (ZESTORETIC) 20-25 MG tablet TAKE 1 TABLET BY MOUTH EVERY DAY 01/29/23  Yes Donita Brooks, MD  metFORMIN (GLUCOPHAGE) 500 MG tablet TAKE 1 TABLET BY MOUTH TWICE A DAY WITH FOOD 01/29/23  Yes Donita Brooks, MD  omeprazole (PRILOSEC) 20 MG capsule Take 20 mg by mouth every morning.    Yes [provider]  pravastatin (PRAVACHOL) 40 MG tablet TAKE 1 TABLET BY MOUTH EVERY DAY 12/01/22  Yes Donita Brooks, MD  prednisoLONE acetate (PRED FORTE) 1 % ophthalmic suspension Place 1 drop into the  left eye 4 (four) times daily. 12/29/22  Yes [provider]  timolol (TIMOPTIC) 0.5 % ophthalmic solution Place 1 drop into the left eye 2 (two) times daily. 11/08/20  Yes [provider]  ibuprofen (ADVIL) 400 MG tablet Take 400 mg by mouth every 6 (six) hours as needed.    [provider]  icosapent Ethyl (VASCEPA) 1 g capsule Take 2 capsules (2 g total) by mouth 2 (two) times daily. 05/20/21   Donita Brooks, MD    Current Outpatient Medications  Medication Sig Dispense Refill   allopurinol (ZYLOPRIM) 300 MG tablet TAKE 1 TABLET BY MOUTH EVERY DAY 90 tablet 2   amLODipine (NORVASC) 10 MG tablet Take 1 tablet (10 mg total) by mouth daily. 90 tablet 3   Difluprednate 0.05 % EMUL Place 1 drop into the left eye 2 (two) times  daily.     fenofibrate 160 MG tablet TAKE 1 TABLET BY MOUTH EVERY DAY 90 tablet 1   latanoprost (XALATAN) 0.005 % ophthalmic solution Place 1 drop into the left eye at bedtime.   11   lisinopril-hydrochlorothiazide (ZESTORETIC) 20-25 MG tablet TAKE 1 TABLET BY MOUTH EVERY DAY 90 tablet 2   metFORMIN (GLUCOPHAGE) 500 MG tablet TAKE 1 TABLET BY MOUTH TWICE A DAY WITH FOOD 180 tablet 2   omeprazole (PRILOSEC) 20 MG capsule Take 20 mg by mouth every morning.      pravastatin (PRAVACHOL) 40 MG tablet TAKE 1 TABLET BY MOUTH EVERY DAY 90 tablet 0   prednisoLONE acetate (PRED FORTE) 1 % ophthalmic suspension Place 1 drop into the left eye 4 (four) times daily.     timolol (TIMOPTIC) 0.5 % ophthalmic solution Place 1 drop into the left eye 2 (two) times daily.     ibuprofen (ADVIL) 400 MG tablet Take 400 mg by mouth every 6 (six) hours as needed.     icosapent Ethyl (VASCEPA) 1 g capsule Take 2 capsules (2 g total) by mouth 2 (two) times daily. 120 capsule 3   Current Facility-Administered Medications  Medication Dose Route Frequency Provider Last Rate Last Admin   0.9 %  sodium chloride infusion  500 mL Intravenous Once Ruby Logiudice, Carie Caddy, MD        Allergies as of 02/09/2023   (No Known Allergies)    Family History  Problem Relation Age of Onset   Cancer Father        lung (smoker)   Colon cancer Brother 63   Colon polyps Neg Hx    Esophageal cancer Neg Hx    Rectal cancer Neg Hx    Stomach cancer Neg Hx    Pancreatic cancer Neg Hx    Liver cancer Neg Hx    Prostate cancer Neg Hx    Crohn's disease Neg Hx    Ulcerative colitis Neg Hx     Social History   Socioeconomic History   Marital status: Divorced    Spouse name: Not on file   Number of children: Not on file   Years of education: Not on file   Highest education level: Not on file  Occupational History   Not on file  Tobacco Use   Smoking status: Former    Packs/day: .25    Types: Cigarettes   Smokeless tobacco: Never    Tobacco comments:    quit Jan. 2021  Vaping Use   Vaping Use: Never used  Substance and Sexual Activity   Alcohol use: Yes    Comment: weekends beer=  3-4    Drug use: No   Sexual activity: Yes  Other Topics Concern   Not on file  Social History Narrative   Not on file   Social Determinants of Health   Financial Resource Strain: Low Risk  (09/04/2022)   Overall Financial Resource Strain (CARDIA)    Difficulty of Paying Living Expenses: Not hard at all  Food Insecurity: No Food Insecurity (09/04/2022)   Hunger Vital Sign    Worried About Running Out of Food in the Last Year: Never true    Ran Out of Food in the Last Year: Never true  Transportation Needs: No Transportation Needs (09/04/2022)   PRAPARE - Administrator, Civil Service (Medical): No    Lack of Transportation (Non-Medical): No  Physical Activity: Inactive (09/04/2022)   Exercise Vital Sign    Days of Exercise per Week: 0 days    Minutes of Exercise per Session: 0 min  Stress: No Stress Concern Present (09/04/2022)   [employer] of Occupational Health - Occupational Stress Questionnaire    Feeling of Stress : Not at all  Social Connections: Moderately Integrated (09/04/2022)   Social Connection and Isolation Panel [NHANES]    Frequency of Communication with Friends and Family: More than three times a week    Frequency of Social Gatherings with Friends and Family: Three times a week    Attends Religious Services: More than 4 times per year    Active Member of Clubs or Organizations: Yes    Attends Banker Meetings: More than 4 times per year    Marital Status: Divorced  Intimate Partner Violence: Not At Risk (09/04/2022)   Humiliation, Afraid, Rape, and Kick questionnaire    Fear of Current or Ex-Partner: No    Emotionally Abused: No    Physically Abused: No    Sexually Abused: No    Physical Exam: Vital signs in last 24 hours: @BP  106/68   Pulse 77   Temp 98.9 F (37.2 C)  (Temporal)   Ht 5\' 11"  (1.803 m)   Wt 275 lb (124.7 kg)   SpO2 96%   BMI 38.35 kg/m  GEN: NAD EYE: Sclerae anicteric ENT: MMM CV: Non-tachycardic Pulm: CTA b/l GI: Soft, NT/ND NEURO:  Alert & Oriented x 3   Erick Blinks, MD [facility]  02/09/2023 11:27 AM

## 2023-02-09 NOTE — Patient Instructions (Signed)
Handout on polyps, hemorrhoids, and diverticulosis given to patient.  Await pathology results.  Resume previous diet and continue present medications. Repeat colonoscopy for surveillance will be determined based off of pathology results.   YOU HAD AN ENDOSCOPIC PROCEDURE TODAY AT THE [facility]:   Refer to the procedure report that was given to you for any specific questions about what was found during the examination.  If the procedure report does not answer your questions, please call your gastroenterologist to clarify.  If you requested that your care partner not be given the details of your procedure findings, then the procedure report has been included in a sealed envelope for you to review at your convenience later.  YOU SHOULD EXPECT: Some feelings of bloating in the abdomen. Passage of more gas than usual.  Walking can help get rid of the air that was put into your GI tract during the procedure and reduce the bloating. If you had a lower endoscopy (such as a colonoscopy or flexible sigmoidoscopy) you may notice spotting of blood in your stool or on the toilet paper. If you underwent a bowel prep for your procedure, you may not have a normal bowel movement for a few days.  Please Note:  You might notice some irritation and congestion in your nose or some drainage.  This is from the oxygen used during your procedure.  There is no need for concern and it should clear up in a day or so.  SYMPTOMS TO REPORT IMMEDIATELY:  Following lower endoscopy (colonoscopy or flexible sigmoidoscopy):  Excessive amounts of blood in the stool  Significant tenderness or worsening of abdominal pains  Swelling of the abdomen that is new, acute  Fever of 100F or higher  For urgent or emergent issues, a gastroenterologist can be reached at any hour by calling [phone]. Do not use MyChart messaging for urgent concerns.    DIET:  We do recommend a small meal at first, but then you may  proceed to your regular diet.  Drink plenty of fluids but you should avoid alcoholic beverages for 24 hours.  ACTIVITY:  You should plan to take it easy for the rest of today and you should NOT DRIVE or use heavy machinery until tomorrow (because of the sedation medicines used during the test).    FOLLOW UP: Our staff will call the number listed on your records the next business day following your procedure.  We will call around 7:15- 8:00 am to check on you and address any questions or concerns that you may have regarding the information given to you following your procedure. If we do not reach you, we will leave a message.     If any biopsies were taken you will be contacted by phone or by letter within the next 1-3 weeks.  Please call us at [phone] if you have not heard about the biopsies in 3 weeks.    SIGNATURES/CONFIDENTIALITY: You and/or your care partner have signed paperwork which will be entered into your electronic medical record.  These signatures attest to the fact that that the information above on your After Visit Summary has been reviewed and is understood.  Full responsibility of the confidentiality of this discharge information lies with you and/or your care-partner.  

## 2023-02-09 NOTE — Op Note (Signed)
[facility] Patient Name: [name] Procedure Date: 02/09/2023 11:34 AM MRN: [id number] Endoscopist: Beverley Fiedler , MD, [id number] Age: 72 Referring MD:  Date of Birth: 1951/01/07 Gender: Male Account #: [id number] Procedure:                Colonoscopy Indications:              High risk colon cancer surveillance: Personal                            history of multiple adenomas (14 adenomas 2019; 4                            adenomas 2020), Family history of colon cancer in a                            first-degree relative before age 59 years, Last                            colonoscopy: June 2020 Medicines:                Monitored Anesthesia Care Procedure:                Pre-Anesthesia Assessment:                           - Prior to the procedure, a History and Physical                            was performed, and patient medications and                            allergies were reviewed. The patient's tolerance of                            previous anesthesia was also reviewed. The risks                            and benefits of the procedure and the sedation                            options and risks were discussed with the patient.                            All questions were answered, and informed consent                            was obtained. Prior Anticoagulants: The patient has                            taken no anticoagulant or antiplatelet agents. ASA                            Grade Assessment: III - A patient with severe  systemic disease. After reviewing the risks and                            benefits, the patient was deemed in satisfactory                            condition to undergo the procedure.                           After obtaining informed consent, the colonoscope                            was passed under direct vision. Throughout the                            procedure, the patient's blood pressure,  pulse, and                            oxygen saturations were monitored continuously. The                            Olympus CF-HQ190L ([id number]) Colonoscope was                            introduced through the anus and advanced to the                            cecum, identified by appendiceal orifice and                            ileocecal valve. The colonoscopy was performed                            without difficulty. The patient tolerated the                            procedure well. The quality of the bowel                            preparation was good. The ileocecal valve,                            appendiceal orifice, and rectum were photographed. Scope In: 11:47:34 AM Scope Out: 12:13:02 PM Scope Withdrawal Time: 0 hours 22 minutes 14 seconds  Total Procedure Duration: 0 hours 25 minutes 28 seconds  Findings:                 The digital rectal exam was normal.                           A 5 mm polyp was found in the cecum. The polyp was                            sessile. The polyp was removed with a cold snare.  Resection and retrieval were complete.                           A 4 mm polyp was found in the ascending colon. The                            polyp was sessile. The polyp was removed with a                            cold snare. Resection and retrieval were complete.                           Four sessile polyps were found in the transverse                            colon. The polyps were 4 to 8 mm in size. These                            polyps were removed with a cold snare. Resection                            and retrieval were complete.                           Four sessile polyps were found in the descending                            colon. The polyps were 3 to 6 mm in size. These                            polyps were removed with a cold snare. Resection                            and retrieval were complete. For hemostasis at  one                            polypectomy site, one hemostatic clip was                            successfully placed (MR conditional). There was no                            bleeding at the end of the maneuver.                           Two sessile polyps were found in the sigmoid colon.                            The polyps were 4 to 5 mm in size. These polyps                            were removed with a cold snare.  Resection and                            retrieval were complete.                           Multiple medium-mouthed and small-mouthed                            diverticula were found in the sigmoid colon,                            descending colon, transverse colon and ascending                            colon.                           Internal hemorrhoids were found during                            retroflexion. The hemorrhoids were small. Complications:            No immediate complications. Estimated Blood Loss:     Estimated blood loss was minimal. Impression:               - One 5 mm polyp in the cecum, removed with a cold                            snare. Resected and retrieved.                           - One 4 mm polyp in the ascending colon, removed                            with a cold snare. Resected and retrieved.                           - Four 4 to 8 mm polyps in the transverse colon,                            removed with a cold snare. Resected and retrieved.                           - Four 3 to 6 mm polyps in the descending colon,                            removed with a cold snare. Resected and retrieved.                            Clip (MR conditional) was placed.                           - Two 4 to 5 mm polyps in the sigmoid colon,  removed with a cold snare. Resected and retrieved.                           - Moderate diverticulosis in the sigmoid colon, in                            the descending colon, in the  transverse colon and                            in the ascending colon.                           - Small internal hemorrhoids. Recommendation:           - Patient has a contact number available for                            emergencies. The signs and symptoms of potential                            delayed complications were discussed with the                            patient. Return to normal activities tomorrow.                            Written discharge instructions were provided to the                            patient.                           - Resume previous diet.                           - Continue present medications.                           - Await pathology results.                           - Repeat colonoscopy is recommended for                            surveillance. The colonoscopy date will be                            determined after pathology results from today's                            exam become available for review. Beverley Fiedler, MD 02/09/2023 12:18:19 PM This report has been signed electronically.

## 2023-02-09 NOTE — Progress Notes (Signed)
VS completed by DT.  Pt's states no medical or surgical changes since previsit or office visit.  

## 2023-02-10 ENCOUNTER — Telehealth: Payer: Self-pay

## 2023-02-10 NOTE — Telephone Encounter (Signed)
Follow up call to pt, lm for pt to call if having any difficulty with normal activities or eating and drinking.  Also to call if any other questions or concerns.  

## 2023-02-12 ENCOUNTER — Encounter: Payer: Self-pay | Admitting: Internal Medicine

## 2023-02-13 DIAGNOSIS — H31012 Macula scars of posterior pole (postinflammatory) (post-traumatic), left eye: Secondary | ICD-10-CM | POA: Diagnosis not present

## 2023-02-13 DIAGNOSIS — H43812 Vitreous degeneration, left eye: Secondary | ICD-10-CM | POA: Diagnosis not present

## 2023-02-13 DIAGNOSIS — H43392 Other vitreous opacities, left eye: Secondary | ICD-10-CM | POA: Diagnosis not present

## 2023-02-23 ENCOUNTER — Other Ambulatory Visit: Payer: Self-pay | Admitting: Family Medicine

## 2023-02-23 DIAGNOSIS — H4312 Vitreous hemorrhage, left eye: Secondary | ICD-10-CM | POA: Diagnosis not present

## 2023-02-23 DIAGNOSIS — E785 Hyperlipidemia, unspecified: Secondary | ICD-10-CM

## 2023-02-23 DIAGNOSIS — H43392 Other vitreous opacities, left eye: Secondary | ICD-10-CM | POA: Diagnosis not present

## 2023-02-23 DIAGNOSIS — H33332 Multiple defects of retina without detachment, left eye: Secondary | ICD-10-CM | POA: Diagnosis not present

## 2023-02-23 DIAGNOSIS — H26492 Other secondary cataract, left eye: Secondary | ICD-10-CM | POA: Diagnosis not present

## 2023-03-24 ENCOUNTER — Other Ambulatory Visit: Payer: Self-pay | Admitting: Family Medicine

## 2023-03-24 DIAGNOSIS — H401123 Primary open-angle glaucoma, left eye, severe stage: Secondary | ICD-10-CM | POA: Diagnosis not present

## 2023-03-24 NOTE — Telephone Encounter (Signed)
Prescription Request  03/24/2023  LOV: Visit date not found  What is the name of the medication or equipment?   amLODipine (NORVASC) 10 MG tablet   Have you contacted your pharmacy to request a refill? Yes   Which pharmacy would you like this sent to?  [id number] [place], [place] - [address] [place] [address] Phone: [phone] Fax: [phone]    Patient notified that their request is being sent to the clinical staff for review and that they should receive a response within 2 business days.   Please advise pharmacist.

## 2023-03-25 ENCOUNTER — Other Ambulatory Visit: Payer: Self-pay | Admitting: Family Medicine

## 2023-03-25 DIAGNOSIS — E785 Hyperlipidemia, unspecified: Secondary | ICD-10-CM

## 2023-03-25 MED ORDER — AMLODIPINE BESYLATE 10 MG PO TABS: 10.0000 mg | ORAL_TABLET | Freq: Every day | ORAL | 0 refills | Status: DC

## 2023-03-25 NOTE — Telephone Encounter (Signed)
Courtesy refill. Requested Prescriptions  Pending Prescriptions Disp Refills   amLODipine (NORVASC) 10 MG tablet 30 tablet 0    Sig: Take 1 tablet (10 mg total) by mouth daily.     Cardiovascular: Calcium Channel Blockers 2 Failed - 03/24/2023  9:12 AM      Failed - Valid encounter within last 6 months    Recent Outpatient Visits           1 year ago Diabetes mellitus type 2 with complications (HCC)   [facility] Pickard, Priscille Heidelberg, MD   1 year ago Encounter for annual wellness exam in Medicare patient   [facility] Pickard, Priscille Heidelberg, MD   1 year ago Exacerbation of gout   [facility] Tanya Nones Priscille Heidelberg, MD   2 years ago Encounter for annual wellness exam in Medicare patient   [facility] Valentino Nose, NP   2 years ago Acute bilateral ankle pain   [facility] Cathlean Marseilles A, NP              Passed - Last BP in normal range    BP Readings from Last 1 Encounters:  02/09/23 114/69         Passed - Last Heart Rate in normal range    Pulse Readings from Last 1 Encounters:  02/09/23 70

## 2023-03-25 NOTE — Telephone Encounter (Signed)
Requested medication (s) are due for refill today: Yes  Requested medication (s) are on the active medication list: Yes  Last refill:  02/23/23  Future visit scheduled: No  Notes to clinic:  Pharmacy requests 90 day supply.    Requested Prescriptions  Pending Prescriptions Disp Refills   pravastatin (PRAVACHOL) 40 MG tablet [Pharmacy Med Name: PRAVASTATIN SODIUM 40 MG TAB] 90 tablet 1    Sig: TAKE 1 TABLET BY MOUTH EVERY DAY     Cardiovascular:  Antilipid - Statins Failed - 03/25/2023  9:33 AM      Failed - Valid encounter within last 12 months    Recent Outpatient Visits           1 year ago Diabetes mellitus type 2 with complications (HCC)   [facility] Pickard, Priscille Heidelberg, MD   1 year ago Encounter for annual wellness exam in Medicare patient   [facility] Pickard, Priscille Heidelberg, MD   1 year ago Exacerbation of gout   [facility] Tanya Nones, Priscille Heidelberg, MD   2 years ago Encounter for annual wellness exam in Medicare patient   [facility] Valentino Nose, NP   2 years ago Acute bilateral ankle pain   [facility] Cathlean Marseilles A, NP              Failed - Lipid Panel in normal range within the last 12 months    Cholesterol  Date Value Ref Range Status  02/07/2022 179 <200 mg/dL Final   LDL Cholesterol (Calc)  Date Value Ref Range Status  02/07/2022 84 mg/dL (calc) Final    Comment:    Reference range: <100 . Desirable range <100 mg/dL for primary prevention;   <70 mg/dL for patients with CHD or diabetic patients  with > or = 2 CHD risk factors. Marland Kitchen LDL-C is now calculated using the Martin-Hopkins  calculation, which is a validated novel method providing  better accuracy than the Friedewald equation in the  estimation of LDL-C.  Horald Pollen et al. Lenox Ahr. 1096;045(40): 2061-2068  (http://education.[employer].com/faq/FAQ164)    Direct LDL  Date Value Ref Range Status  08/29/2013  84.7 mg/dL Final    Comment:    Optimal:  <100 mg/dLNear or Above Optimal:  100-129 mg/dLBorderline High:  130-159 mg/dLHigh:  160-189 mg/dLVery High:  >190 mg/dL   HDL  Date Value Ref Range Status  02/07/2022 43 > OR = 40 mg/dL Final   Triglycerides  Date Value Ref Range Status  02/07/2022 391 (H) <150 mg/dL Final    Comment:    . If a non-fasting specimen was collected, consider repeat triglyceride testing on a fasting specimen if clinically indicated.  Perry Mount et al. J. of Clin. Lipidol. 2015;9:129-169. .    Triglyceride fasting, serum  Date Value Ref Range Status  08/26/2006 64 0 - 149 mg/dL Final    Comment:    See lab report for associated comment(s)         Passed - Patient is not pregnant

## 2023-03-31 DIAGNOSIS — H43392 Other vitreous opacities, left eye: Secondary | ICD-10-CM | POA: Diagnosis not present

## 2023-03-31 DIAGNOSIS — H33332 Multiple defects of retina without detachment, left eye: Secondary | ICD-10-CM | POA: Diagnosis not present

## 2023-03-31 LAB — OPHTHALMOLOGY REPORT-SCANNED

## 2023-04-16 ENCOUNTER — Other Ambulatory Visit: Payer: Self-pay | Admitting: Family Medicine

## 2023-05-13 ENCOUNTER — Other Ambulatory Visit: Payer: Self-pay | Admitting: Family Medicine

## 2023-05-13 NOTE — Telephone Encounter (Signed)
Prescription Request  05/13/2023  LOV: 02/07/2022  What is the name of the medication or equipment?   amLODipine (NORVASC) 10 MG tablet  **90 day script requested**   Have you contacted your pharmacy to request a refill? Yes   Which pharmacy would you like this sent to?  [id number] [place], [place] - [address] [place] [address] Phone: [phone] Fax: [phone]    Patient notified that their request is being sent to the clinical staff for review and that they should receive a response within 2 business days.   Please advise pharmacist.

## 2023-05-14 NOTE — Telephone Encounter (Signed)
Requested medication (s) are due for refill today: yes  Requested medication (s) are on the active medication list: yes  Last refill:  04/16/23  Future visit scheduled: no  Notes to clinic:  Unable to refill per protocol, courtesy refill already given, routing for provider approval.      Requested Prescriptions  Pending Prescriptions Disp Refills   amLODipine (NORVASC) 10 MG tablet 30 tablet 0    Sig: Take 1 tablet (10 mg total) by mouth daily.     Cardiovascular: Calcium Channel Blockers 2 Failed - 05/14/2023  9:47 AM      Failed - Valid encounter within last 6 months    Recent Outpatient Visits           1 year ago Diabetes mellitus type 2 with complications (HCC)   [facility] Pickard, Priscille Heidelberg, MD   1 year ago Encounter for annual wellness exam in Medicare patient   [facility] Donita Brooks, MD   2 years ago Exacerbation of gout   [facility] Tanya Nones Priscille Heidelberg, MD   2 years ago Encounter for annual wellness exam in Medicare patient   [facility] Valentino Nose, NP   2 years ago Acute bilateral ankle pain   [facility] Cathlean Marseilles A, NP              Passed - Last BP in normal range    BP Readings from Last 1 Encounters:  02/09/23 114/69         Passed - Last Heart Rate in normal range    Pulse Readings from Last 1 Encounters:  02/09/23 70

## 2023-05-18 NOTE — Telephone Encounter (Signed)
Left message to return call; need to schedule med refill appointment.

## 2023-05-20 NOTE — Telephone Encounter (Signed)
Med refill appt scheduled for Tues, 05/26/23.

## 2023-05-26 ENCOUNTER — Ambulatory Visit (INDEPENDENT_AMBULATORY_CARE_PROVIDER_SITE_OTHER): Payer: Medicare Other | Admitting: Family Medicine

## 2023-05-26 VITALS — BP 124/82 | HR 84 | Temp 97.6°F | Ht 71.0 in | Wt 270.2 lb

## 2023-05-26 DIAGNOSIS — I10 Essential (primary) hypertension: Secondary | ICD-10-CM | POA: Diagnosis not present

## 2023-05-26 DIAGNOSIS — E781 Pure hyperglyceridemia: Secondary | ICD-10-CM

## 2023-05-26 DIAGNOSIS — E785 Hyperlipidemia, unspecified: Secondary | ICD-10-CM | POA: Diagnosis not present

## 2023-05-26 DIAGNOSIS — Z125 Encounter for screening for malignant neoplasm of prostate: Secondary | ICD-10-CM

## 2023-05-26 DIAGNOSIS — E1169 Type 2 diabetes mellitus with other specified complication: Secondary | ICD-10-CM | POA: Diagnosis not present

## 2023-05-26 DIAGNOSIS — Z7984 Long term (current) use of oral hypoglycemic drugs: Secondary | ICD-10-CM | POA: Diagnosis not present

## 2023-05-26 DIAGNOSIS — E118 Type 2 diabetes mellitus with unspecified complications: Secondary | ICD-10-CM | POA: Diagnosis not present

## 2023-05-26 NOTE — Progress Notes (Signed)
Subjective:    Patient ID: [name], male    DOB: 01-09-1951, 72 y.o.   MRN: [id number]  Patient is a very pleasant 72 year old gentleman history of type II diabetes mellitus, hypertriglyceridemia, dyslipidemia, and hypertension.  He continues to drink large quantities of alcohol.  He admits to recently having a gout exacerbation in his hand.  Patient is in the precontemplative phase.  He denies any chest pain or shortness of breath dyspnea exertion.  Not checking his blood sugars.  He denies any diarrhea polydipsia or blurry vision.  He denies any neuropathy in his feet. Past Medical History:  Diagnosis Date   Arthritis    Barrett esophagus    Bladder neck obstruction    Cataract    removed in left eye   Colon polyp    Diabetes mellitus without complication (HCC)    Esophageal stricture    GERD (gastroesophageal reflux disease)    Glaucoma    History of hiatal hernia    Hyperlipidemia    Hypertension    Peyronie disease    Prosthetic eye globe    Right eye   Sleep apnea    does not use machine per pt.   Smoker    Wears glasses    Past Surgical History:  Procedure Laterality Date   BACK SURGERY     lumbar decompression and fusion (Dr. 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     fenofibrate 160 MG tablet TAKE 1 TABLET BY MOUTH EVERY DAY 90 tablet 1   ibuprofen (ADVIL) 400 MG tablet Take 400 mg by mouth every 6 (six) hours as needed.     icosapent Ethyl (VASCEPA) 1 g capsule Take 2 capsules (2 g total) by mouth 2 (two) times daily. 120 capsule 3   latanoprost (XALATAN) 0.005 % ophthalmic solution Place 1 drop into the left eye at bedtime.   11   lisinopril-hydrochlorothiazide (ZESTORETIC) 20-25 MG tablet TAKE 1 TABLET BY MOUTH EVERY DAY 90 tablet 2   metFORMIN (GLUCOPHAGE) 500 MG tablet TAKE 1 TABLET BY MOUTH TWICE A DAY WITH FOOD 180 tablet 2   omeprazole (PRILOSEC) 20 MG capsule Take 20 mg by mouth every morning.      pravastatin (PRAVACHOL) 40 MG tablet TAKE 1 TABLET BY MOUTH EVERY DAY 90 tablet 1   prednisoLONE acetate (PRED FORTE) 1 % ophthalmic suspension Place 1 drop into the left eye 4 (four) times daily.     timolol (TIMOPTIC)  0.5 % ophthalmic solution Place 1 drop into the left eye 2 (two) times daily.     No current facility-administered medications on file prior to visit.   No Known Allergies Social History   Socioeconomic History   Marital status: Divorced    Spouse name: Not on file   Number of children: Not on file   Years of education: Not on file   Highest education level: Not on file  Occupational History   Not on file  Tobacco Use   Smoking status: Former    Current packs/day: 0.25    Types: Cigarettes   Smokeless tobacco: Never   Tobacco comments:    quit Jan. 2021  Vaping Use   Vaping status: Never Used  Substance and Sexual Activity   Alcohol use: Yes    Comment: weekends beer= 3-4    Drug use: No   Sexual activity: Yes  Other Topics Concern   Not on file  Social History Narrative   Not on file   Social Determinants of Health   Financial Resource Strain: Low Risk  (09/04/2022)   Overall  Financial Resource Strain (CARDIA)    Difficulty of Paying Living Expenses: Not hard at all  Food Insecurity: No Food Insecurity (09/04/2022)   Hunger Vital Sign    Worried About Running Out of Food in the Last Year: Never true    Ran Out of Food in the Last Year: Never true  Transportation Needs: No Transportation Needs (09/04/2022)   PRAPARE - Administrator, Civil Service (Medical): No    Lack of Transportation (Non-Medical): No  Physical Activity: Inactive (09/04/2022)   Exercise Vital Sign    Days of Exercise per Week: 0 days    Minutes of Exercise per Session: 0 min  Stress: No Stress Concern Present (09/04/2022)   [employer] of Occupational Health - Occupational Stress Questionnaire    Feeling of Stress : Not at all  Social Connections: Moderately Integrated (09/04/2022)   Social Connection and Isolation Panel [NHANES]    Frequency of Communication with Friends and Family: More than three times a week    Frequency of Social Gatherings with Friends and Family: Three times a week    Attends Religious Services: More than 4 times per year    Active Member of Clubs or Organizations: Yes    Attends Banker Meetings: More than 4 times per year    Marital Status: Divorced  Intimate Partner Violence: Not At Risk (09/04/2022)   Humiliation, Afraid, Rape, and Kick questionnaire    Fear of Current or Ex-Partner: No    Emotionally Abused: No    Physically Abused: No    Sexually Abused: No      Review of Systems  All other systems reviewed and are negative.      Objective:   Physical Exam Vitals reviewed.  Constitutional:      Appearance: He is well-developed.  HENT:     Head: Normocephalic and atraumatic.     Right Ear: External ear normal.     Left Ear: External ear normal.     Nose: Nose normal.     Mouth/Throat:     Pharynx: No oropharyngeal exudate.  Eyes:     Conjunctiva/sclera: Conjunctivae normal.   Neck:     Thyroid: No  thyromegaly.     Vascular: No JVD.     Trachea: No tracheal deviation.  Cardiovascular:     Rate and Rhythm: Normal rate and regular rhythm.  Heart sounds: Normal heart sounds. No murmur heard. Pulmonary:     Effort: Pulmonary effort is normal. No respiratory distress.     Breath sounds: Normal breath sounds. No stridor. No wheezing or rales.  Abdominal:     General: Bowel sounds are normal. There is no distension.     Palpations: Abdomen is soft. There is no mass.     Tenderness: There is no abdominal tenderness. There is no guarding or rebound.  Musculoskeletal:     Cervical back: Neck supple.  Lymphadenopathy:     Cervical: No cervical adenopathy.  Neurological:     Mental Status: He is alert and oriented to person, place, and time.     Cranial Nerves: No cranial nerve deficit.     Motor: No abnormal muscle tone.     Coordination: Coordination normal.     Deep Tendon Reflexes: Reflexes are normal and symmetric.  Psychiatric:        Behavior: Behavior normal.        Thought Content: Thought content normal.        Judgment: Judgment normal.           Assessment & Plan:  Diabetes mellitus type 2 with complications (HCC) - Plan: Hemoglobin A1c, CBC with Differential/Platelet, COMPLETE METABOLIC PANEL WITH GFR, Lipid panel, Protein / Creatinine Ratio, Urine  Hypertriglyceridemia  Benign essential HTN  Hyperlipidemia associated with type 2 diabetes mellitus (HCC)  Prostate cancer screening - Plan: PSA Recommended reduction in his consumption of alcohol however he is in the precontemplative phase.  Discussed switching metformin to Ozempic to help with weight loss given his pain in his knee from arthritis.  However the patient is not interested in making this change at present time.  Blood pressure is at goal.  Check hemoglobin A1c.  Goal hemoglobin A1c is less than 7.  Screen for prostate cancer with a PSA.  Check a fasting lipid panel.

## 2023-05-27 DIAGNOSIS — H31092 Other chorioretinal scars, left eye: Secondary | ICD-10-CM | POA: Diagnosis not present

## 2023-05-27 DIAGNOSIS — Z970 Presence of artificial eye: Secondary | ICD-10-CM | POA: Diagnosis not present

## 2023-05-27 LAB — COMPLETE METABOLIC PANEL WITH GFR
AG Ratio: 1.5 (calc) (ref 1.0–2.5)
ALT: 19 U/L (ref 9–46)
AST: 17 U/L (ref 10–35)
Albumin: 4.3 g/dL (ref 3.6–5.1)
Alkaline phosphatase (APISO): 42 U/L (ref 35–144)
BUN: 16 mg/dL (ref 7–25)
CO2: 21 mmol/L (ref 20–32)
Calcium: 9.6 mg/dL (ref 8.6–10.3)
Chloride: 102 mmol/L (ref 98–110)
Creat: 0.75 mg/dL (ref 0.70–1.28)
Globulin: 2.9 g/dL (ref 1.9–3.7)
Glucose, Bld: 112 mg/dL — ABNORMAL HIGH (ref 65–99)
Potassium: 3.8 mmol/L (ref 3.5–5.3)
Sodium: 140 mmol/L (ref 135–146)
Total Bilirubin: 0.6 mg/dL (ref 0.2–1.2)
Total Protein: 7.2 g/dL (ref 6.1–8.1)
eGFR: 96 mL/min/{1.73_m2} (ref >=60)

## 2023-05-27 LAB — CBC WITH DIFFERENTIAL/PLATELET
Absolute Monocytes: 497 {cells}/uL (ref 200–950)
Basophils Absolute: 38 {cells}/uL (ref 0–200)
Basophils Relative: 0.7 %
Eosinophils Absolute: 405 {cells}/uL (ref 15–500)
Eosinophils Relative: 7.5 %
HCT: 40.1 % (ref 38.5–50.0)
Hemoglobin: 14 g/dL (ref 13.2–17.1)
Lymphs Abs: 1777 {cells}/uL (ref 850–3900)
MCH: 32.6 pg (ref 27.0–33.0)
MCHC: 34.9 g/dL (ref 32.0–36.0)
MCV: 93.3 fL (ref 80.0–100.0)
MPV: 11 fL (ref 7.5–12.5)
Monocytes Relative: 9.2 %
Neutro Abs: 2684 {cells}/uL (ref 1500–7800)
Neutrophils Relative %: 49.7 %
Platelets: 220 10*3/uL (ref 140–400)
RBC: 4.3 10*6/uL (ref 4.20–5.80)
RDW: 13.3 % (ref 11.0–15.0)
Total Lymphocyte: 32.9 %
WBC: 5.4 10*3/uL (ref 3.8–10.8)

## 2023-05-27 LAB — HEMOGLOBIN A1C
Hgb A1c MFr Bld: 6.7 %{Hb} — ABNORMAL HIGH (ref <5.7)
Mean Plasma Glucose: 146 mg/dL
eAG (mmol/L): 8.1 mmol/L

## 2023-05-27 LAB — LIPID PANEL
Cholesterol: 196 mg/dL (ref <200)
HDL: 47 mg/dL (ref >=40)
Non-HDL Cholesterol (Calc): 149 mg/dL — ABNORMAL HIGH (ref <130)
Total CHOL/HDL Ratio: 4.2 (calc) (ref <5.0)
Triglycerides: 507 mg/dL — ABNORMAL HIGH (ref <150)

## 2023-05-27 LAB — PROTEIN / CREATININE RATIO, URINE
Creatinine, Urine: 182 mg/dL (ref 20–320)
Protein/Creat Ratio: 280 mg/g{creat} — ABNORMAL HIGH (ref 25–148)
Protein/Creatinine Ratio: 0.28 mg/mg{creat} — ABNORMAL HIGH (ref 0.025–0.148)
Total Protein, Urine: 51 mg/dL — ABNORMAL HIGH (ref 5–25)

## 2023-05-27 LAB — OPHTHALMOLOGY REPORT-SCANNED

## 2023-05-27 LAB — PSA: PSA: 3.07 ng/mL (ref <=4.00)

## 2023-06-26 ENCOUNTER — Other Ambulatory Visit: Payer: Self-pay | Admitting: Family Medicine

## 2023-06-26 DIAGNOSIS — E785 Hyperlipidemia, unspecified: Secondary | ICD-10-CM

## 2023-06-26 NOTE — Telephone Encounter (Signed)
Rx 03/25/23 #90 1RF Requested Prescriptions  Pending Prescriptions Disp Refills   pravastatin (PRAVACHOL) 40 MG tablet [Pharmacy Med Name: PRAVASTATIN SODIUM 40 MG TAB] 90 tablet 1    Sig: TAKE 1 TABLET BY MOUTH EVERY DAY     Cardiovascular:  Antilipid - Statins Failed - 06/26/2023 12:34 PM      Failed - Valid encounter within last 12 months    Recent Outpatient Visits           1 year ago Diabetes mellitus type 2 with complications (HCC)   [facility] Pickard, Priscille Heidelberg, MD   2 years ago Encounter for annual wellness exam in Medicare patient   [facility] Pickard, Priscille Heidelberg, MD   2 years ago Exacerbation of gout   [facility] Tanya Nones, Priscille Heidelberg, MD   2 years ago Encounter for annual wellness exam in Medicare patient   [facility] Valentino Nose, NP   2 years ago Acute bilateral ankle pain   [facility] Cathlean Marseilles A, NP              Failed - Lipid Panel in normal range within the last 12 months    Cholesterol  Date Value Ref Range Status  05/26/2023 196 <200 mg/dL Final   LDL Cholesterol (Calc)  Date Value Ref Range Status  05/26/2023  mg/dL (calc) Final    Comment:    . LDL cholesterol not calculated. Triglyceride levels greater than 400 mg/dL invalidate calculated LDL results. . Reference range: <100 . Desirable range <100 mg/dL for primary prevention;   <70 mg/dL for patients with CHD or diabetic patients  with > or = 2 CHD risk factors. Marland Kitchen LDL-C is now calculated using the Martin-Hopkins  calculation, which is a validated novel method providing  better accuracy than the Friedewald equation in the  estimation of LDL-C.  Horald Pollen et al. Lenox Ahr. 4174;081(44): 2061-2068  (http://education.[facility].com/faq/FAQ164)    Direct LDL  Date Value Ref Range Status  08/29/2013 84.7 mg/dL Final    Comment:    Optimal:  <100 mg/dLNear or Above Optimal:  100-129  mg/dLBorderline High:  130-159 mg/dLHigh:  160-189 mg/dLVery High:  >190 mg/dL   HDL  Date Value Ref Range Status  05/26/2023 47 > OR = 40 mg/dL Final   Triglycerides  Date Value Ref Range Status  05/26/2023 507 (H) <150 mg/dL Final    Comment:    . If a non-fasting specimen was collected, consider repeat triglyceride testing on a fasting specimen if clinically indicated.  Perry Mount et al. J. of Clin. Lipidol. 2015;9:129-169. . . There is increased risk of pancreatitis when the  triglyceride concentration is very high  (> or = 500 mg/dL, especially if > or = 8185 mg/dL).  Perry Mount et al. J. of Clin. Lipidol. 2015;9:129-169. .    Triglyceride fasting, serum  Date Value Ref Range Status  08/26/2006 64 0 - 149 mg/dL Final    Comment:    See lab report for associated comment(s)         Passed - Patient is not pregnant

## 2023-07-21 DIAGNOSIS — H401123 Primary open-angle glaucoma, left eye, severe stage: Secondary | ICD-10-CM | POA: Diagnosis not present

## 2023-08-06 ENCOUNTER — Telehealth: Payer: Self-pay

## 2023-08-06 ENCOUNTER — Other Ambulatory Visit: Payer: Self-pay | Admitting: Family Medicine

## 2023-08-06 ENCOUNTER — Other Ambulatory Visit: Payer: Self-pay

## 2023-08-06 DIAGNOSIS — I10 Essential (primary) hypertension: Secondary | ICD-10-CM

## 2023-08-06 MED ORDER — AMLODIPINE BESYLATE 10 MG PO TABS: 10.0000 mg | ORAL_TABLET | Freq: Every day | ORAL | 1 refills | Status: DC

## 2023-08-06 NOTE — Telephone Encounter (Signed)
Request is too soon for refill, last refill 01/12/23 for 90 and 2 refills.  Requested Prescriptions  Pending Prescriptions Disp Refills   allopurinol (ZYLOPRIM) 300 MG tablet [Pharmacy Med Name: ALLOPURINOL 300 MG TABLET] 90 tablet 2    Sig: TAKE 1 TABLET BY MOUTH EVERY DAY     Endocrinology:  Gout Agents - allopurinol Failed - 08/06/2023 10:28 AM      Failed - Uric Acid in normal range and within 360 days    Uric Acid, Serum  Date Value Ref Range Status  08/31/2020 8.8 (H) 4.0 - 8.0 mg/dL Final    Comment:    Therapeutic target for gout patients: <6.0 mg/dL .          Failed - Valid encounter within last 12 months    Recent Outpatient Visits           1 year ago Diabetes mellitus type 2 with complications (HCC)   [facility] Pickard, Priscille Heidelberg, MD   2 years ago Encounter for annual wellness exam in Medicare patient   [facility] Tanya Nones, Priscille Heidelberg, MD   2 years ago Exacerbation of gout   [facility] Donita Brooks, MD   2 years ago Encounter for annual wellness exam in Medicare patient   [facility] Valentino Nose, NP   2 years ago Acute bilateral ankle pain   [facility] Valentino Nose, NP              Passed - Cr in normal range and within 360 days    Creat  Date Value Ref Range Status  05/26/2023 0.75 0.70 - 1.28 mg/dL Final   Creatinine, Urine  Date Value Ref Range Status  05/26/2023 182 20 - 320 mg/dL Final         Passed - CBC within normal limits and completed in the last 12 months    WBC  Date Value Ref Range Status  05/26/2023 5.4 3.8 - 10.8 Thousand/uL Final   RBC  Date Value Ref Range Status  05/26/2023 4.30 4.20 - 5.80 Million/uL Final   Hemoglobin  Date Value Ref Range Status  05/26/2023 14.0 13.2 - 17.1 g/dL Final   HCT  Date Value Ref Range Status  05/26/2023 40.1 38.5 - 50.0 % Final   MCHC  Date Value Ref Range Status  05/26/2023 34.9 32.0  - 36.0 g/dL Final   [facility]  Date Value Ref Range Status  05/26/2023 32.6 27.0 - 33.0 pg Final   MCV  Date Value Ref Range Status  05/26/2023 93.3 80.0 - 100.0 fL Final   No results found for: "PLTCOUNTKUC", "LABPLAT", "POCPLA" RDW  Date Value Ref Range Status  05/26/2023 13.3 11.0 - 15.0 % Final

## 2023-08-06 NOTE — Telephone Encounter (Signed)
Prescription Request  08/06/2023  LOV: 05/26/23  What is the name of the medication or equipment? amLODipine (NORVASC) 10 MG tablet [[id number]]  Have you contacted your pharmacy to request a refill? Yes   Which pharmacy would you like this sent to?  [id number] [place], [place] - [address] [place] [address] Phone: [phone] Fax: [phone]    Patient notified that their request is being sent to the clinical staff for review and that they should receive a response within 2 business days.   Please advise at [facility] [phone]

## 2023-09-08 ENCOUNTER — Telehealth: Payer: Self-pay | Admitting: Family Medicine

## 2023-09-08 DIAGNOSIS — M1712 Unilateral primary osteoarthritis, left knee: Secondary | ICD-10-CM | POA: Diagnosis not present

## 2023-09-08 NOTE — Telephone Encounter (Signed)
Patient dropped off pre-op clearance form from [facility] for provider to complete.  Form completion fee of $20 will be collected after forms completed.  Patient requesting call on confirmed number on file of [phone] when forms ready for pickup.  Forms placed on nurse's desk.

## 2023-09-17 ENCOUNTER — Ambulatory Visit: Payer: Medicare Other

## 2023-09-17 VITALS — Ht 71.0 in | Wt 265.0 lb

## 2023-09-17 DIAGNOSIS — Z0000 Encounter for general adult medical examination without abnormal findings: Secondary | ICD-10-CM | POA: Diagnosis not present

## 2023-09-17 NOTE — Patient Instructions (Addendum)
[name] , Thank you for taking time to come for your Medicare Wellness Visit. I appreciate your ongoing commitment to your health goals. Please review the following plan we discussed and let me know if I can assist you in the future.   Referrals/Orders/Follow-Ups/Clinician Recommendations: Client understands the importance of follow-up with providers by attending scheduled visits.  This is a list of the screening recommended for you and due dates:  Health Maintenance  Topic Date Due   Zoster (Shingles) Vaccine (1 of 2) 12/22/2001   DTaP/Tdap/Td vaccine (2 - Tdap) 09/22/2017   Flu Shot  04/23/2023   COVID-19 Vaccine (4 - 2024-25 season) 05/24/2023   Eye exam for diabetics  06/04/2023   Hemoglobin A1C  11/23/2023   Yearly kidney function blood test for diabetes  05/25/2024   Yearly kidney health urinalysis for diabetes  05/25/2024   Complete foot exam   05/25/2024   Medicare Annual Wellness Visit  09/16/2024   Colon Cancer Screening  02/08/2025   Pneumonia Vaccine  Completed   Hepatitis C Screening  Completed   HPV Vaccine  Aged Out    Advanced directives: (Declined) Advance directive discussed with you today. Even though you declined this today, please call our office should you change your mind, and we can give you the proper paperwork for you to fill out.  Next Medicare Annual Wellness Visit scheduled for next year: No

## 2023-09-17 NOTE — Progress Notes (Signed)
Subjective:   [name] is a 72 y.o. male who presents for Medicare Annual/Subsequent preventive examination.  Visit Complete: Virtual I connected with  [name] on 09/17/23 by a audio enabled telemedicine application and verified that I am speaking with the correct person using two identifiers.  Patient Location: Home  Provider Location: Home Office  I discussed the limitations of evaluation and management by telemedicine. The patient expressed understanding and agreed to proceed.  Vital Signs: Because this visit was a virtual/telehealth visit, some criteria may be missing or patient reported. Any vitals not documented were not able to be obtained and vitals that have been documented are patient reported.  Cardiac Risk Factors include: advanced age (>81men, >87 women);diabetes mellitus;dyslipidemia;hypertension;male gender;obesity (BMI >30kg/m2);sedentary lifestyle     Objective:    Today's Vitals   09/17/23 1044 09/17/23 1055  Weight: 265 lb (120.2 kg)   Height: 5\' 11"  (1.803 m)   PainSc: 6  6   PainLoc: Knee    Body mass index is 36.96 kg/m.     09/17/2023   10:52 AM 09/04/2022    3:48 PM 12/21/2019    8:20 AM 07/26/2018   11:50 AM 07/19/2018   10:24 AM 11/18/2017   10:09 AM  Advanced Directives  Does Patient Have a Medical Advance Directive? No Yes No No No No  Does patient want to make changes to medical advance directive?  No - Patient declined      Would patient like information on creating a medical advance directive? No - Patient declined  No - Patient declined No - Patient declined No - Patient declined     Current Medications (verified) Outpatient Encounter Medications as of 09/17/2023  Medication Sig   allopurinol (ZYLOPRIM) 300 MG tablet TAKE 1 TABLET BY MOUTH EVERY DAY   amLODipine (NORVASC) 10 MG tablet Take 1 tablet (10 mg total) by mouth daily.   Difluprednate 0.05 % EMUL Place 1 drop into the left eye 2 (two) times daily.    fenofibrate 160 MG tablet TAKE 1 TABLET BY MOUTH EVERY DAY   ibuprofen (ADVIL) 400 MG tablet Take 400 mg by mouth every 6 (six) hours as needed.   icosapent Ethyl (VASCEPA) 1 g capsule Take 2 capsules (2 g total) by mouth 2 (two) times daily.   latanoprost (XALATAN) 0.005 % ophthalmic solution Place 1 drop into the left eye at bedtime.    lisinopril-hydrochlorothiazide (ZESTORETIC) 20-25 MG tablet TAKE 1 TABLET BY MOUTH EVERY DAY   metFORMIN (GLUCOPHAGE) 500 MG tablet TAKE 1 TABLET BY MOUTH TWICE A DAY WITH FOOD   omeprazole (PRILOSEC) 20 MG capsule Take 20 mg by mouth every morning.    pravastatin (PRAVACHOL) 40 MG tablet TAKE 1 TABLET BY MOUTH EVERY DAY   prednisoLONE acetate (PRED FORTE) 1 % ophthalmic suspension Place 1 drop into the left eye 4 (four) times daily.   timolol (TIMOPTIC) 0.5 % ophthalmic solution Place 1 drop into the left eye 2 (two) times daily.   No facility-administered encounter medications on file as of 09/17/2023.    Allergies (verified) Patient has no known allergies.   History: Past Medical History:  Diagnosis Date   Arthritis    Barrett esophagus    Bladder neck obstruction    Cataract    removed in left eye   Colon polyp    Diabetes mellitus without complication (HCC)    Esophageal stricture    GERD (gastroesophageal reflux disease)    Glaucoma    History  of hiatal hernia    Hyperlipidemia    Hypertension    Peyronie disease    Prosthetic eye globe    Right eye   Sleep apnea    does not use machine per pt.   Smoker    Wears glasses    Past Surgical History:  Procedure Laterality Date   BACK SURGERY     lumbar decompression and fusion (Dr. Jordan Likes) 2019   COLONOSCOPY     DP   ELBOW SURGERY Right    fractured radius surgery     KNEE ARTHROSCOPY N/A    NASAL SINUS SURGERY     after injury has a metal plate   NESBIT PROCEDURE N/A 12/21/2019   Procedure: NESBIT PROCEDURE PENILE PLICATION AND PENILE BLOCK;  Surgeon: Sebastian Ache, MD;   Location: [facility];  Service: Urology;  Laterality: N/A;  1 HR   POLYPECTOMY     TONSILLECTOMY     removed as child unsure of age   TOTAL KNEE ARTHROPLASTY Right    traumatic eye enucleation  2011   right artifical eye   Family History  Problem Relation Age of Onset   Cancer Father        lung (smoker)   Colon cancer Brother 70   Colon polyps Neg Hx    Esophageal cancer Neg Hx    Rectal cancer Neg Hx    Stomach cancer Neg Hx    Pancreatic cancer Neg Hx    Liver cancer Neg Hx    Prostate cancer Neg Hx    Crohn's disease Neg Hx    Ulcerative colitis Neg Hx    Social History   Socioeconomic History   Marital status: Divorced    Spouse name: Not on file   Number of children: Not on file   Years of education: Not on file   Highest education level: Not on file  Occupational History   Not on file  Tobacco Use   Smoking status: Former    Current packs/day: 0.25    Types: Cigarettes   Smokeless tobacco: Never   Tobacco comments:    quit Jan. 2021  Vaping Use   Vaping status: Never Used  Substance and Sexual Activity   Alcohol use: Yes    Comment: weekends beer= 3-4    Drug use: No   Sexual activity: Yes  Other Topics Concern   Not on file  Social History Narrative   Not on file   Social Drivers of Health   Financial Resource Strain: Low Risk  (09/17/2023)   Overall Financial Resource Strain (CARDIA)    Difficulty of Paying Living Expenses: Not hard at all  Food Insecurity: No Food Insecurity (09/17/2023)   Hunger Vital Sign    Worried About Running Out of Food in the Last Year: Never true    Ran Out of Food in the Last Year: Never true  Transportation Needs: No Transportation Needs (09/17/2023)   PRAPARE - Administrator, Civil Service (Medical): No    Lack of Transportation (Non-Medical): No  Physical Activity: Inactive (09/17/2023)   Exercise Vital Sign    Days of Exercise per Week: 0 days    Minutes of Exercise per Session: 0  min  Stress: No Stress Concern Present (09/17/2023)   [employer] of Occupational Health - Occupational Stress Questionnaire    Feeling of Stress : Not at all  Social Connections: Moderately Integrated (09/17/2023)   Social Connection and Isolation Panel [NHANES]  Frequency of Communication with Friends and Family: More than three times a week    Frequency of Social Gatherings with Friends and Family: Three times a week    Attends Religious Services: More than 4 times per year    Active Member of Clubs or Organizations: Yes    Attends Banker Meetings: More than 4 times per year    Marital Status: Divorced    Tobacco Counseling Counseling given: Not Answered Tobacco comments: quit Jan. 2021   Clinical Intake:  Pre-visit preparation completed: Yes  Pain : 0-10 Pain Score: 6  Pain Type: Chronic pain Pain Location: Knee Pain Orientation: Left     BMI - recorded: 36.96 Nutritional Status: BMI > 30  Obese Nutritional Risks: None Diabetes: Yes CBG done?: No Did pt. bring in CBG monitor from home?: No  How often do you need to have someone help you when you read instructions, pamphlets, or other written materials from your doctor or pharmacy?: 1 - Never What is the last grade level you completed in school?: HSG  Interpreter Needed?: No  Information entered by :: Susie Cassette, LPN.   Activities of Daily Living    09/17/2023   10:49 AM 09/16/2023    7:44 PM  In your present state of health, do you have any difficulty performing the following activities:  Hearing? 0 0  Vision? 1 1  Difficulty concentrating or making decisions? 0 0  Walking or climbing stairs? 0 0  Dressing or bathing? 0 0  Doing errands, shopping? 0 0  Preparing Food and eating ? N N  Using the Toilet? N N  In the past six months, have you accidently leaked urine? N N  Do you have problems with loss of bowel control? N N  Managing your Medications? N N  Managing your  Finances? N N  Housekeeping or managing your Housekeeping? N N    Patient Care Team: Donita Brooks, MD as PCP - General (Family Medicine) Janet Berlin, MD as Consulting Physician (Ophthalmology) Stephannie Li, MD as Consulting Physician (Ophthalmology)  Indicate any recent Medical Services you may have received from other than [facility] providers in the past year (date may be approximate).     Assessment:   This is a routine wellness examination for [place].  Hearing/Vision screen Hearing Screening - Comments:: Denies hearing difficulties.   Vision Screening - Comments:: Wears rx glasses - up to date with routine eye exams with Janet Berlin, MD and Stephannie Li, MD (retinal specialist)    Goals Addressed             This Visit's Progress    Client understands the importance of follow-up with providers by attending scheduled visits        Depression Screen    09/17/2023   10:46 AM 05/26/2023    9:23 AM 09/04/2022    3:53 PM 02/07/2022   10:20 AM 01/16/2021   12:53 PM 07/22/2019   10:38 AM 10/02/2017    8:03 AM  PHQ 2/9 Scores  PHQ - 2 Score 0 0 0 0 0 0 0  PHQ- 9 Score 0   0       Fall Risk    09/17/2023   10:50 AM 09/16/2023    7:44 PM 05/26/2023    9:23 AM 09/04/2022    3:46 PM 01/16/2021   12:53 PM  Fall Risk   Falls in the past year? 0 0 0 0 0  Number falls in past yr: 0  0  0 0  Injury with Fall? 0 0  0 0  Risk for fall due to : No Fall Risks   No Fall Risks   Follow up Falls prevention discussed   Falls evaluation completed;Education provided;Falls prevention discussed     MEDICARE RISK AT HOME: Medicare Risk at Home Any stairs in or around the home?: Yes If so, are there any without handrails?: No Home free of loose throw rugs in walkways, pet beds, electrical cords, etc?: Yes Adequate lighting in your home to reduce risk of falls?: Yes Life alert?: No Use of a cane, walker or w/c?: No Grab bars in the bathroom?: No Shower chair or bench in  shower?: No Elevated toilet seat or a handicapped toilet?: No  TIMED UP AND GO:  Was the test performed?  No    Cognitive Function:    09/17/2023   10:46 AM  MMSE - Mini Mental State Exam  Not completed: Unable to complete        09/17/2023   10:46 AM 09/04/2022    3:49 PM 01/16/2021   12:54 PM  6CIT Screen  What Year? 0 points 0 points 0 points  What month? 0 points 0 points 0 points  What time? 0 points 0 points 0 points  Count back from 20 0 points 0 points 0 points  Months in reverse 0 points 0 points 0 points  Repeat phrase 0 points 0 points 4 points  Total Score 0 points 0 points 4 points    Immunizations Immunization History  Administered Date(s) Administered   Fluad Quad(high Dose 65+) 07/22/2019, 07/04/2020   PFIZER(Purple Top)SARS-COV-2 Vaccination 10/27/2019, 11/21/2019, 07/04/2020   Pneumococcal Conjugate-13 10/02/2017   Pneumococcal Polysaccharide-23 08/15/2015, 05/17/2021   Td 09/23/2007   Zoster, Live 07/08/2013    TDAP status: Due, Education has been provided regarding the importance of this vaccine. Advised may receive this vaccine at local pharmacy or Health Dept. Aware to provide a copy of the vaccination record if obtained from local pharmacy or Health Dept. Verbalized acceptance and understanding.  Flu Vaccine status: Due, Education has been provided regarding the importance of this vaccine. Advised may receive this vaccine at local pharmacy or Health Dept. Aware to provide a copy of the vaccination record if obtained from local pharmacy or Health Dept. Verbalized acceptance and understanding.  Pneumococcal vaccine status: Up to date  Covid-19 vaccine status: Information provided on how to obtain vaccines.   Qualifies for Shingles Vaccine? Yes   Zostavax completed Yes   Shingrix Completed?: No.    Education has been provided regarding the importance of this vaccine. Patient has been advised to call insurance company to determine out of pocket  expense if they have not yet received this vaccine. Advised may also receive vaccine at local pharmacy or Health Dept. Verbalized acceptance and understanding.  Screening Tests Health Maintenance  Topic Date Due   Zoster Vaccines- Shingrix (1 of 2) 12/22/2001   DTaP/Tdap/Td (2 - Tdap) 09/22/2017   INFLUENZA VACCINE  04/23/2023   COVID-19 Vaccine (4 - 2024-25 season) 05/24/2023   OPHTHALMOLOGY EXAM  06/04/2023   HEMOGLOBIN A1C  11/23/2023   Diabetic kidney evaluation - eGFR measurement  05/25/2024   Diabetic kidney evaluation - Urine ACR  05/25/2024   FOOT EXAM  05/25/2024   Medicare Annual Wellness (AWV)  09/16/2024   Colonoscopy  02/08/2025   Pneumonia Vaccine 69+ Years old  Completed   Hepatitis C Screening  Completed   HPV VACCINES  Aged Out  Health Maintenance  Health Maintenance Due  Topic Date Due   Zoster Vaccines- Shingrix (1 of 2) 12/22/2001   DTaP/Tdap/Td (2 - Tdap) 09/22/2017   INFLUENZA VACCINE  04/23/2023   COVID-19 Vaccine (4 - 2024-25 season) 05/24/2023   OPHTHALMOLOGY EXAM  06/04/2023    Colorectal cancer screening: Type of screening: Colonoscopy. Completed 02/09/2023. Repeat every 2 years  Lung Cancer Screening: (Low Dose CT Chest recommended if Age 43-80 years, 20 pack-year currently smoking OR have quit w/in 15years.) does not qualify.   Lung Cancer Screening Referral: no  Additional Screening:  Hepatitis C Screening: does qualify; Completed 03/24/2016  Vision Screening: Recommended annual ophthalmology exams for early detection of glaucoma and other disorders of the eye. Is the patient up to date with their annual eye exam?  Yes  Who is the provider or what is the name of the office in which the patient attends annual eye exams? Janet Berlin, MD and Stephannie Li, MD If pt is not established with a provider, would they like to be referred to a provider to establish care? No .   Dental Screening: Recommended annual dental exams for proper oral  hygiene  Diabetic Foot Exam: Diabetic Foot Exam: Completed 05/26/2023  Community Resource Referral / Chronic Care Management: CRR required this visit?  No   CCM required this visit?  No     Plan:     I have personally reviewed and noted the following in the patient's chart:   Medical and social history Use of alcohol, tobacco or illicit drugs  Current medications and supplements including opioid prescriptions. Patient is not currently taking opioid prescriptions. Functional ability and status Nutritional status Physical activity Advanced directives List of other physicians Hospitalizations, surgeries, and ER visits in previous 12 months Vitals Screenings to include cognitive, depression, and falls Referrals and appointments  In addition, I have reviewed and discussed with patient certain preventive protocols, quality metrics, and best practice recommendations. A written personalized care plan for preventive services as well as general preventive health recommendations were provided to patient.     Mickeal Needy, LPN   [id number]   After Visit Summary: (MyChart) Due to this being a telephonic visit, the after visit summary with patients personalized plan was offered to patient via MyChart   Nurse Notes: None at this time.

## 2023-09-18 DIAGNOSIS — Z0279 Encounter for issue of other medical certificate: Secondary | ICD-10-CM

## 2023-09-21 NOTE — Telephone Encounter (Signed)
Forms completed and signed by provider. Patient came to office today; paid $20 form completion fee in cash and picked up original paperwork.   Successfully faxed all forms and copy of receipt to [facility] with a confirmation time stamp of 09-21-2023 15:21.

## 2023-09-22 ENCOUNTER — Other Ambulatory Visit: Payer: Self-pay | Admitting: Family Medicine

## 2023-09-22 DIAGNOSIS — E785 Hyperlipidemia, unspecified: Secondary | ICD-10-CM

## 2023-10-02 DIAGNOSIS — H35352 Cystoid macular degeneration, left eye: Secondary | ICD-10-CM | POA: Diagnosis not present

## 2023-10-02 DIAGNOSIS — Z4421 Encounter for fitting and adjustment of artificial right eye: Secondary | ICD-10-CM | POA: Diagnosis not present

## 2023-10-02 DIAGNOSIS — H20023 Recurrent acute iridocyclitis, bilateral: Secondary | ICD-10-CM | POA: Diagnosis not present

## 2023-10-02 DIAGNOSIS — H401123 Primary open-angle glaucoma, left eye, severe stage: Secondary | ICD-10-CM | POA: Diagnosis not present

## 2023-10-30 DIAGNOSIS — H20022 Recurrent acute iridocyclitis, left eye: Secondary | ICD-10-CM | POA: Diagnosis not present

## 2023-10-30 DIAGNOSIS — H3581 Retinal edema: Secondary | ICD-10-CM | POA: Diagnosis not present

## 2023-11-01 ENCOUNTER — Other Ambulatory Visit: Payer: Self-pay | Admitting: Family Medicine

## 2023-11-02 DIAGNOSIS — M1712 Unilateral primary osteoarthritis, left knee: Secondary | ICD-10-CM | POA: Diagnosis not present

## 2023-11-02 DIAGNOSIS — M25562 Pain in left knee: Secondary | ICD-10-CM | POA: Diagnosis not present

## 2023-11-05 ENCOUNTER — Ambulatory Visit: Payer: Self-pay | Admitting: Student

## 2023-11-05 DIAGNOSIS — E119 Type 2 diabetes mellitus without complications: Secondary | ICD-10-CM

## 2023-11-05 NOTE — Progress Notes (Signed)
Sent message, via epic in basket, requesting orders in epic from Careers adviser.

## 2023-11-05 NOTE — H&P (Signed)
 TOTAL KNEE ADMISSION H&P  Patient is being admitted for left total knee arthroplasty.  Subjective:  Chief Complaint:left knee pain.  HPI: [name], 73 y.o. male, has a history of pain and functional disability in the left knee due to arthritis and has failed non-surgical conservative treatments for greater than 12 weeks to includeNSAID's and/or analgesics, corticosteriod injections, flexibility and strengthening excercises, use of assistive devices, and activity modification.  Onset of symptoms was gradual, starting 10 years ago with rapidlly worsening course since that time. The patient noted no past surgery on the left knee(s).  Patient currently rates pain in the left knee(s) at 10 out of 10 with activity. Patient has night pain, worsening of pain with activity and weight bearing, pain that interferes with activities of daily living, pain with passive range of motion, crepitus, and joint swelling.  Patient has evidence of subchondral cysts, subchondral sclerosis, periarticular osteophytes, and joint space narrowing by imaging studies. There is no active infection.  Patient Active Problem List   Diagnosis Date Noted   Hyperlipidemia associated with type 2 diabetes mellitus (HCC) 01/16/2021   Acute bilateral ankle pain 08/31/2020   Spondylolisthesis at L3-L4 level 07/26/2018   Prediabetes    Sleep apnea    Smoker    Prosthetic eye globe    Obesity (BMI 30-39.9) 07/08/2013   GERD 01/17/2010   SLEEP APNEA 01/17/2010   Hypertension 01/11/2008   Hypertriglyceridemia 07/06/2006   Past Medical History:  Diagnosis Date   Arthritis    Barrett esophagus    Bladder neck obstruction    Cataract    removed in left eye   Colon polyp    Diabetes mellitus without complication (HCC)    Esophageal stricture    GERD (gastroesophageal reflux disease)    Glaucoma    History of hiatal hernia    Hyperlipidemia    Hypertension    Peyronie disease    Prosthetic eye globe    Right eye    Sleep apnea    does not use machine per pt.   Smoker    Wears glasses     Past Surgical History:  Procedure Laterality Date   BACK SURGERY     lumbar decompression and fusion (Dr. 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 90 tablet 1    Difluprednate 0.05 % EMUL Place 1 drop into the left eye 2 (two) times daily.      dorzolamide-timolol (COSOPT) 2-0.5 % ophthalmic solution Place 1 drop into the left eye 2 (two) times daily.      fenofibrate 160 MG tablet TAKE  1 TABLET BY MOUTH EVERY DAY 90 tablet 1    ibuprofen (ADVIL) 200 MG tablet Take 400 mg by mouth every 6 (six) hours as needed for moderate pain (pain score 4-6).      latanoprost (XALATAN) 0.005 % ophthalmic solution Place 1 drop into the left eye at bedtime.   11    lisinopril-hydrochlorothiazide (ZESTORETIC) 20-25 MG tablet TAKE 1 TABLET BY MOUTH EVERY DAY 90 tablet 2    metFORMIN (GLUCOPHAGE) 500 MG tablet TAKE 1 TABLET BY MOUTH TWICE A DAY WITH FOOD 180 tablet 2    omeprazole (PRILOSEC) 20 MG capsule Take 20 mg by mouth every morning.       pravastatin (PRAVACHOL) 40 MG tablet TAKE 1 TABLET BY MOUTH EVERY DAY 90 tablet 1    prednisoLONE acetate (PRED FORTE) 1 % ophthalmic suspension Place 1 drop  into the left eye 4 (four) times daily.      No current facility-administered medications for this visit.   Facility-Administered Medications Ordered in Other Visits  Medication Dose Route Frequency Provider Last Rate Last Admin   vancomycin (VANCOCIN) 1,000 mg in sodium chloride 0.9 % 500 mL IVPB  1,000 mg Intravenous Once Clint Bolder S, PA-C       No Known Allergies  Social History   Tobacco Use   Smoking status: Some Days    Current packs/day: 0.25    Types: Cigarettes   Smokeless tobacco: Never   Tobacco comments:    quit Jan. 2021  Substance Use Topics   Alcohol use: Yes    Alcohol/week: 10.0 standard drinks of alcohol    Types: 10 Standard drinks or equivalent per week    Family History  Problem Relation Age of Onset   Cancer Father        lung (smoker)   Colon cancer Brother 43   Colon polyps Neg Hx    Esophageal cancer Neg Hx    Rectal cancer Neg Hx    Stomach cancer Neg Hx    Pancreatic cancer Neg Hx    Liver cancer Neg Hx    Prostate cancer Neg Hx    Crohn's disease Neg Hx    Ulcerative colitis Neg Hx      Review of Systems  Musculoskeletal:  Positive for arthralgias, gait problem and joint swelling.  All other systems reviewed and are negative.   Objective:  Physical Exam Constitutional:      Appearance: Normal appearance.  HENT:     Head: Normocephalic and atraumatic.     Nose: Nose normal.     Mouth/Throat:     Mouth: Mucous membranes are moist.     Pharynx: Oropharynx is clear.  Eyes:     Conjunctiva/sclera: Conjunctivae normal.  Cardiovascular:     Rate and Rhythm: Normal rate and regular rhythm.     Pulses: Normal pulses.     Heart sounds: Normal heart sounds.  Pulmonary:     Effort: Pulmonary effort is normal.     Breath sounds: Normal breath sounds.  Abdominal:     General: Abdomen is flat.     Palpations: Abdomen is soft.  Genitourinary:    Comments: deferred Musculoskeletal:     Cervical back: Normal range of motion and neck  supple.     Comments: Examination of the left knee reveals no skin wounds or lesions. He has swelling and an effusion. No warmth or erythema. Tenderness palpation medial joint line, lateral joint line, peripatellar retinacular tissues with positive grind sign. Range of  motion is 10 to 110 degrees without any instability. Painless range of motion of the hip.  Distally, he is neurovascular intact.  Skin:    General: Skin is warm and dry.     Capillary Refill: Capillary refill takes less than 2 seconds.  Neurological:     General: No focal deficit present.     Mental Status: He is alert and oriented to person, place, and time.  Psychiatric:        Mood and Affect: Mood normal.        Behavior: Behavior normal.        Thought Content: Thought content normal.        Judgment: Judgment normal.     Vital signs in last 24 hours: @VSRANGES @  Labs:   Estimated body mass index is 40.03 kg/m as calculated from the following:   Height as of 11/18/23: 5\' 11"  (1.803 m).   Weight as of 11/18/23: 130.2 kg.   Imaging Review Plain radiographs demonstrate severe degenerative joint disease of the left knee(s). The overall alignment issignificant valgus. The bone quality appears to be adequate for age and reported activity level.      Assessment/Plan:  End stage arthritis, left knee   The patient history, physical examination, clinical judgment of the provider and imaging studies are consistent with end stage degenerative joint disease of the left knee(s) and total knee arthroplasty is deemed medically necessary. The treatment options including medical management, injection therapy arthroscopy and arthroplasty were discussed at length. The risks and benefits of total knee arthroplasty were presented and reviewed. The risks due to aseptic loosening, infection, stiffness, patella tracking problems, thromboembolic complications and other imponderables were discussed. The patient acknowledged the  explanation, agreed to proceed with the plan and consent was signed. Patient is being admitted for inpatient treatment for surgery, pain control, PT, OT, prophylactic antibiotics, VTE prophylaxis, progressive ambulation and ADL's and discharge planning. The patient is planning to be discharged home with OPPT after an overnight stay.   Therapy Plans: outpatient therapy At [facility] 11/30/23.  Disposition: Home with daughter Planned DVT Prophylaxis: aspirin 81mg  BID DME needed: walker. Friend might have ice machine.  PCP: Cleared.  TXA: IV Allergies:  - NDKA.  Anesthesia Concerns: None.  BMI: 38.7 Last HgbA1c: 6.7 Other: - T2DM, metformin. - Prosthetic eye, right.  - Oxycodone, zofran, has ibuprofen.  - 11/18/23: Hgb 14.0, K+ 3.7, Cr. 0.54. - Mupirocin ointment.      Patient's anticipated LOS is less than 2 midnights, meeting these requirements: - Younger than 15 - Lives within 1 hour of care - Has a competent adult at home to recover with post-op recover - NO history of  - Chronic pain requiring opiods  - Diabetes  - Coronary Artery Disease  - Heart failure  - Heart attack  - Stroke  - DVT/VTE  - Cardiac arrhythmia  - Respiratory Failure/COPD  - Renal failure  - Anemia  - Advanced Liver disease

## 2023-11-05 NOTE — H&P (View-Only) (Signed)
 TOTAL KNEE ADMISSION H&P  Patient is being admitted for left total knee arthroplasty.  Subjective:  Chief Complaint:left knee pain.  HPI: [name], 73 y.o. male, has a history of pain and functional disability in the left knee due to arthritis and has failed non-surgical conservative treatments for greater than 12 weeks to includeNSAID's and/or analgesics, corticosteriod injections, flexibility and strengthening excercises, use of assistive devices, and activity modification.  Onset of symptoms was gradual, starting 10 years ago with rapidlly worsening course since that time. The patient noted no past surgery on the left knee(s).  Patient currently rates pain in the left knee(s) at 10 out of 10 with activity. Patient has night pain, worsening of pain with activity and weight bearing, pain that interferes with activities of daily living, pain with passive range of motion, crepitus, and joint swelling.  Patient has evidence of subchondral cysts, subchondral sclerosis, periarticular osteophytes, and joint space narrowing by imaging studies. There is no active infection.  Patient Active Problem List   Diagnosis Date Noted   Hyperlipidemia associated with type 2 diabetes mellitus (HCC) 01/16/2021   Acute bilateral ankle pain 08/31/2020   Spondylolisthesis at L3-L4 level 07/26/2018   Prediabetes    Sleep apnea    Smoker    Prosthetic eye globe    Obesity (BMI 30-39.9) 07/08/2013   GERD 01/17/2010   SLEEP APNEA 01/17/2010   Hypertension 01/11/2008   Hypertriglyceridemia 07/06/2006   Past Medical History:  Diagnosis Date   Arthritis    Barrett esophagus    Bladder neck obstruction    Cataract    removed in left eye   Colon polyp    Diabetes mellitus without complication (HCC)    Esophageal stricture    GERD (gastroesophageal reflux disease)    Glaucoma    History of hiatal hernia    Hyperlipidemia    Hypertension    Peyronie disease    Prosthetic eye globe    Right eye    Sleep apnea    does not use machine per pt.   Smoker    Wears glasses     Past Surgical History:  Procedure Laterality Date   BACK SURGERY     lumbar decompression and fusion (Dr. 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 90 tablet 1    Difluprednate 0.05 % EMUL Place 1 drop into the left eye 2 (two) times daily.      dorzolamide-timolol (COSOPT) 2-0.5 % ophthalmic solution Place 1 drop into the left eye 2 (two) times daily.      fenofibrate 160 MG tablet TAKE  1 TABLET BY MOUTH EVERY DAY 90 tablet 1    ibuprofen (ADVIL) 200 MG tablet Take 400 mg by mouth every 6 (six) hours as needed for moderate pain (pain score 4-6).      latanoprost (XALATAN) 0.005 % ophthalmic solution Place 1 drop into the left eye at bedtime.   11    lisinopril-hydrochlorothiazide (ZESTORETIC) 20-25 MG tablet TAKE 1 TABLET BY MOUTH EVERY DAY 90 tablet 2    metFORMIN (GLUCOPHAGE) 500 MG tablet TAKE 1 TABLET BY MOUTH TWICE A DAY WITH FOOD 180 tablet 2    omeprazole (PRILOSEC) 20 MG capsule Take 20 mg by mouth every morning.       pravastatin (PRAVACHOL) 40 MG tablet TAKE 1 TABLET BY MOUTH EVERY DAY 90 tablet 1    prednisoLONE acetate (PRED FORTE) 1 % ophthalmic suspension Place 1 drop  into the left eye 4 (four) times daily.      No current facility-administered medications for this visit.   Facility-Administered Medications Ordered in Other Visits  Medication Dose Route Frequency Provider Last Rate Last Admin   vancomycin (VANCOCIN) 1,000 mg in sodium chloride 0.9 % 500 mL IVPB  1,000 mg Intravenous Once Clint Bolder S, PA-C       No Known Allergies  Social History   Tobacco Use   Smoking status: Some Days    Current packs/day: 0.25    Types: Cigarettes   Smokeless tobacco: Never   Tobacco comments:    quit Jan. 2021  Substance Use Topics   Alcohol use: Yes    Alcohol/week: 10.0 standard drinks of alcohol    Types: 10 Standard drinks or equivalent per week    Family History  Problem Relation Age of Onset   Cancer Father        lung (smoker)   Colon cancer Brother 43   Colon polyps Neg Hx    Esophageal cancer Neg Hx    Rectal cancer Neg Hx    Stomach cancer Neg Hx    Pancreatic cancer Neg Hx    Liver cancer Neg Hx    Prostate cancer Neg Hx    Crohn's disease Neg Hx    Ulcerative colitis Neg Hx      Review of Systems  Musculoskeletal:  Positive for arthralgias, gait problem and joint swelling.  All other systems reviewed and are negative.   Objective:  Physical Exam Constitutional:      Appearance: Normal appearance.  HENT:     Head: Normocephalic and atraumatic.     Nose: Nose normal.     Mouth/Throat:     Mouth: Mucous membranes are moist.     Pharynx: Oropharynx is clear.  Eyes:     Conjunctiva/sclera: Conjunctivae normal.  Cardiovascular:     Rate and Rhythm: Normal rate and regular rhythm.     Pulses: Normal pulses.     Heart sounds: Normal heart sounds.  Pulmonary:     Effort: Pulmonary effort is normal.     Breath sounds: Normal breath sounds.  Abdominal:     General: Abdomen is flat.     Palpations: Abdomen is soft.  Genitourinary:    Comments: deferred Musculoskeletal:     Cervical back: Normal range of motion and neck  supple.     Comments: Examination of the left knee reveals no skin wounds or lesions. He has swelling and an effusion. No warmth or erythema. Tenderness palpation medial joint line, lateral joint line, peripatellar retinacular tissues with positive grind sign. Range of  motion is 10 to 110 degrees without any instability. Painless range of motion of the hip.  Distally, he is neurovascular intact.  Skin:    General: Skin is warm and dry.     Capillary Refill: Capillary refill takes less than 2 seconds.  Neurological:     General: No focal deficit present.     Mental Status: He is alert and oriented to person, place, and time.  Psychiatric:        Mood and Affect: Mood normal.        Behavior: Behavior normal.        Thought Content: Thought content normal.        Judgment: Judgment normal.     Vital signs in last 24 hours: @VSRANGES @  Labs:   Estimated body mass index is 40.03 kg/m as calculated from the following:   Height as of 11/18/23: 5\' 11"  (1.803 m).   Weight as of 11/18/23: 130.2 kg.   Imaging Review Plain radiographs demonstrate severe degenerative joint disease of the left knee(s). The overall alignment issignificant valgus. The bone quality appears to be adequate for age and reported activity level.      Assessment/Plan:  End stage arthritis, left knee   The patient history, physical examination, clinical judgment of the provider and imaging studies are consistent with end stage degenerative joint disease of the left knee(s) and total knee arthroplasty is deemed medically necessary. The treatment options including medical management, injection therapy arthroscopy and arthroplasty were discussed at length. The risks and benefits of total knee arthroplasty were presented and reviewed. The risks due to aseptic loosening, infection, stiffness, patella tracking problems, thromboembolic complications and other imponderables were discussed. The patient acknowledged the  explanation, agreed to proceed with the plan and consent was signed. Patient is being admitted for inpatient treatment for surgery, pain control, PT, OT, prophylactic antibiotics, VTE prophylaxis, progressive ambulation and ADL's and discharge planning. The patient is planning to be discharged home with OPPT after an overnight stay.   Therapy Plans: outpatient therapy At [facility] 11/30/23.  Disposition: Home with daughter Planned DVT Prophylaxis: aspirin 81mg  BID DME needed: walker. Friend might have ice machine.  PCP: Cleared.  TXA: IV Allergies:  - NDKA.  Anesthesia Concerns: None.  BMI: 38.7 Last HgbA1c: 6.7 Other: - T2DM, metformin. - Prosthetic eye, right.  - Oxycodone, zofran, has ibuprofen.  - 11/18/23: Hgb 14.0, K+ 3.7, Cr. 0.54. - Mupirocin ointment.      Patient's anticipated LOS is less than 2 midnights, meeting these requirements: - Younger than 15 - Lives within 1 hour of care - Has a competent adult at home to recover with post-op recover - NO history of  - Chronic pain requiring opiods  - Diabetes  - Coronary Artery Disease  - Heart failure  - Heart attack  - Stroke  - DVT/VTE  - Cardiac arrhythmia  - Respiratory Failure/COPD  - Renal failure  - Anemia  - Advanced Liver disease

## 2023-11-11 DIAGNOSIS — H2012 Chronic iridocyclitis, left eye: Secondary | ICD-10-CM | POA: Diagnosis not present

## 2023-11-12 ENCOUNTER — Encounter (HOSPITAL_COMMUNITY)
Admission: RE | Admit: 2023-11-12 | Discharge: 2023-11-12 | Disposition: A | Discharge status: Home / Self Care | Payer: Medicare Other | Source: Ambulatory Visit | Attending: Family Medicine | Admitting: Family Medicine

## 2023-11-17 NOTE — Progress Notes (Addendum)
 COVID Vaccine Completed: yes  Date of COVID positive in last 90 days:  PCP - Lynnea Ferrier, MD Cardiologist -   Medical clearance 05/26/23 in Media tab  Chest x-ray -  EKG -  Stress Test -  ECHO -  Cardiac Cath -  Pacemaker/ICD device last checked: Spinal Cord Stimulator:  Bowel Prep -   Sleep Study -  CPAP -   Fasting Blood Sugar -  Checks Blood Sugar _____ times a day  Last dose of GLP1 agonist-  N/A GLP1 instructions:  Hold 7 days before surgery    Last dose of SGLT-2 inhibitors-  N/A SGLT-2 instructions:  Hold 3 days before surgery    Blood Thinner Instructions:  Last dose:   Time: Aspirin Instructions: Last Dose:  Activity level:  Can go up a flight of stairs and perform activities of daily living without stopping and without symptoms of chest pain or shortness of breath.  Able to exercise without symptoms  Unable to go up a flight of stairs without symptoms of     Anesthesia review:   Patient denies shortness of breath, fever, cough and chest pain at PAT appointment  Patient verbalized understanding of instructions that were given to them at the PAT appointment. Patient was also instructed that they will need to review over the PAT instructions again at home before surgery.

## 2023-11-17 NOTE — Patient Instructions (Signed)
 SURGICAL WAITING ROOM VISITATION  Patients having surgery or a procedure may have no more than 2 support people in the waiting area - these visitors may rotate.    Children under the age of 42 must have an adult with them who is not the patient.  Due to an increase in RSV and influenza rates and associated hospitalizations, children ages 37 and under may not visit patients in [facility] hospitals.  Visitors with respiratory illnesses are discouraged from visiting and should remain at home.  If the patient needs to stay at the hospital during part of their recovery, the visitor guidelines for inpatient rooms apply. Pre-op nurse will coordinate an appropriate time for 1 support person to accompany patient in pre-op.  This support person may not rotate.    Please refer to the [facility] website for the visitor guidelines for Inpatients (after your surgery is over and you are in a regular room).    Your procedure is scheduled on: 11/25/23   Report to [facility] Main Entrance    Report to admitting at 9:20 AM   Call this number if you have problems the morning of surgery [phone]   Do not eat food :After Midnight.   After Midnight you may have the following liquids until 8:50 AM DAY OF SURGERY  Water Non-Citrus Juices (without pulp, NO RED-Apple, White grape, White cranberry) Black Coffee (NO MILK/CREAM OR CREAMERS, sugar ok)  Clear Tea (NO MILK/CREAM OR CREAMERS, sugar ok) regular and decaf                             Plain Jell-O (NO RED)                                           Fruit ices (not with fruit pulp, NO RED)                                     Popsicles (NO RED)                                                               Sports drinks like Gatorade (NO RED)                 The day of surgery:  Drink ONE (1) Pre-Surgery G2 at 8:50 AM the morning of surgery. Drink in one sitting. Do not sip.  This drink was given to you during your hospital  pre-op  appointment visit. Nothing else to drink after completing the  Pre-Surgery G2.          If you have questions, please contact your surgeon's office.   FOLLOW BOWEL PREP AND ANY ADDITIONAL PRE OP INSTRUCTIONS YOU RECEIVED FROM YOUR SURGEON'S OFFICE!!!     Oral Hygiene is also important to reduce your risk of infection.                                    Remember - BRUSH YOUR  TEETH THE MORNING OF SURGERY WITH YOUR REGULAR TOOTHPASTE  DENTURES WILL BE REMOVED PRIOR TO SURGERY PLEASE DO NOT APPLY "Poly grip" OR ADHESIVES!!!   Do NOT smoke after Midnight   Stop all vitamins and herbal supplements 7 days before surgery.   Take these medicines the morning of surgery with A SIP OF WATER: Allopurinol, Amlodipine, Fenofibrate, Omeprazole, Pravastatin   DO NOT TAKE ANY ORAL DIABETIC MEDICATIONS DAY OF YOUR SURGERY  How to Manage Your Diabetes Before and After Surgery  Why is it important to control my blood sugar before and after surgery? Improving blood sugar levels before and after surgery helps healing and can limit problems. A way of improving blood sugar control is eating a healthy diet by:  Eating less sugar and carbohydrates  Increasing activity/exercise  Talking with your doctor about reaching your blood sugar goals High blood sugars (greater than 180 mg/dL) can raise your risk of infections and slow your recovery, so you will need to focus on controlling your diabetes during the weeks before surgery. Make sure that the doctor who takes care of your diabetes knows about your planned surgery including the date and location.  How do I manage my blood sugar before surgery? Check your blood sugar at least 4 times a day, starting 2 days before surgery, to make sure that the level is not too high or low. Check your blood sugar the morning of your surgery when you wake up and every 2 hours until you get to the Short Stay unit. If your blood sugar is less than 70 mg/dL, you will need to  treat for low blood sugar: Do not take insulin. Treat a low blood sugar (less than 70 mg/dL) with  cup of clear juice (cranberry or apple), 4 glucose tablets, OR glucose gel. Recheck blood sugar in 15 minutes after treatment (to make sure it is greater than 70 mg/dL). If your blood sugar is not greater than 70 mg/dL on recheck, call [phone] for further instructions. Report your blood sugar to the short stay nurse when you get to Short Stay.  If you are admitted to the hospital after surgery: Your blood sugar will be checked by the staff and you will probably be given insulin after surgery (instead of oral diabetes medicines) to make sure you have good blood sugar levels. The goal for blood sugar control after surgery is 80-180 mg/dL.   WHAT DO I DO ABOUT MY DIABETES MEDICATION?  Do not take oral diabetes medicines (pills) the morning of surgery.  THE DAY BEFORE SURGERY, take Metformin as prescribed.      THE MORNING OF SURGERY, do not take Metformin   Reviewed and Endorsed by [facility] Patient Education Committee, August 2015  Bring CPAP mask and tubing day of surgery.                              You may not have any metal on your body including jewelry, and body piercing             Do not wear lotions, powders, cologne, or deodorant              Men may shave face and neck.   Do not bring valuables to the hospital.  IS NOT             RESPONSIBLE   FOR VALUABLES.   Contacts, glasses, dentures or bridgework may not be worn  into surgery.   Bring small overnight bag day of surgery.   DO NOT BRING YOUR HOME MEDICATIONS TO THE HOSPITAL. PHARMACY WILL DISPENSE MEDICATIONS LISTED ON YOUR MEDICATION LIST TO YOU DURING YOUR ADMISSION IN THE HOSPITAL!    Special Instructions: Bring a copy of your healthcare power of attorney and living will documents the day of surgery if you haven't scanned them before.              Please read over the following fact sheets you  were given: IF YOU HAVE QUESTIONS ABOUT YOUR PRE-OP INSTRUCTIONS PLEASE CALL [name]    If you received a COVID test during your pre-op visit  it is requested that you wear a mask when out in public, stay away from anyone that may not be feeling well and notify your surgeon if you develop symptoms. If you test positive for Covid or have been in contact with anyone that has tested positive in the last 10 days please notify you surgeon.      Pre-operative 5 CHG Bath Instructions   You can play a key role in reducing the risk of infection after surgery. Your skin needs to be as free of germs as possible. You can reduce the number of germs on your skin by washing with CHG (chlorhexidine gluconate) soap before surgery. CHG is an antiseptic soap that kills germs and continues to kill germs even after washing.   DO NOT use if you have an allergy to chlorhexidine/CHG or antibacterial soaps. If your skin becomes reddened or irritated, stop using the CHG and notify one of our RNs at [phone].   Please shower with the CHG soap starting 4 days before surgery using the following schedule:     Please keep in mind the following:  DO NOT shave, including legs and underarms, starting the day of your first shower.   You may shave your face at any point before/day of surgery.  Place clean sheets on your bed the day you start using CHG soap. Use a clean washcloth (not used since being washed) for each shower. DO NOT sleep with pets once you start using the CHG.   CHG Shower Instructions:  If you choose to wash your hair and private area, wash first with your normal shampoo/soap.  After you use shampoo/soap, rinse your hair and body thoroughly to remove shampoo/soap residue.  Turn the water OFF and apply about 3 tablespoons (45 ml) of CHG soap to a CLEAN washcloth.  Apply CHG soap ONLY FROM YOUR NECK DOWN TO YOUR TOES (washing for 3-5 minutes)  DO NOT use CHG soap on face, private areas,  open wounds, or sores.  Pay special attention to the area where your surgery is being performed.  If you are having back surgery, having someone wash your back for you may be helpful. Wait 2 minutes after CHG soap is applied, then you may rinse off the CHG soap.  Pat dry with a clean towel  Put on clean clothes/pajamas   If you choose to wear lotion, please use ONLY the CHG-compatible lotions on the back of this paper.     Additional instructions for the day of surgery: DO NOT APPLY any lotions, deodorants, cologne, or perfumes.   Put on clean/comfortable clothes.  Brush your teeth.  Ask your nurse before applying any prescription medications to the skin.      CHG Compatible Lotions   Aveeno Moisturizing lotion  Cetaphil Moisturizing Cream  Cetaphil Moisturizing Lotion  Clairol Herbal Essence Moisturizing Lotion, Dry Skin  Clairol Herbal Essence Moisturizing Lotion, Extra Dry Skin  Clairol Herbal Essence Moisturizing Lotion, Normal Skin  Curel Age Defying Therapeutic Moisturizing Lotion with Alpha Hydroxy  Curel Extreme Care Body Lotion  Curel Soothing Hands Moisturizing Hand Lotion  Curel Therapeutic Moisturizing Cream, Fragrance-Free  Curel Therapeutic Moisturizing Lotion, Fragrance-Free  Curel Therapeutic Moisturizing Lotion, Original Formula  Eucerin Daily Replenishing Lotion  Eucerin Dry Skin Therapy Plus Alpha Hydroxy Crme  Eucerin Dry Skin Therapy Plus Alpha Hydroxy Lotion  Eucerin Original Crme  Eucerin Original Lotion  Eucerin Plus Crme Eucerin Plus Lotion  Eucerin TriLipid Replenishing Lotion  Keri Anti-Bacterial Hand Lotion  Keri Deep Conditioning Original Lotion Dry Skin Formula Softly Scented  Keri Deep Conditioning Original Lotion, Fragrance Free Sensitive Skin Formula  Keri Lotion Fast Absorbing Fragrance Free Sensitive Skin Formula  Keri Lotion Fast Absorbing Softly Scented Dry Skin Formula  Keri Original Lotion  Keri Skin Renewal Lotion Keri Silky  Smooth Lotion  Keri Silky Smooth Sensitive Skin Lotion  Nivea Body Creamy Conditioning Oil  Nivea Body Extra Enriched Lotion  Nivea Body Original Lotion  Nivea Body Sheer Moisturizing Lotion Nivea Crme  Nivea Skin Firming Lotion  NutraDerm 30 Skin Lotion  NutraDerm Skin Lotion  NutraDerm Therapeutic Skin Cream  NutraDerm Therapeutic Skin Lotion  ProShield Protective Hand Cream  Provon moisturizing lotion   View Pre-Surgery Education Videos:  [email or web address]     Incentive Spirometer  An incentive spirometer is a tool that can help keep your lungs clear and active. This tool measures how well you are filling your lungs with each breath. Taking long deep breaths may help reverse or decrease the chance of developing breathing (pulmonary) problems (especially infection) following: A long period of time when you are unable to move or be active. BEFORE THE PROCEDURE  If the spirometer includes an indicator to show your best effort, your nurse or respiratory therapist will set it to a desired goal. If possible, sit up straight or lean slightly forward. Try not to slouch. Hold the incentive spirometer in an upright position. INSTRUCTIONS FOR USE  Sit on the edge of your bed if possible, or sit up as far as you can in bed or on a chair. Hold the incentive spirometer in an upright position. Breathe out normally. Place the mouthpiece in your mouth and seal your lips tightly around it. Breathe in slowly and as deeply as possible, raising the piston or the ball toward the top of the column. Hold your breath for 3-5 seconds or for as long as possible. Allow the piston or ball to fall to the bottom of the column. Remove the mouthpiece from your mouth and breathe out normally. Rest for a few seconds and repeat Steps 1 through 7 at least 10 times every 1-2 hours when you are awake. Take your time and take a few normal breaths between  deep breaths. The spirometer may include an indicator to show your best effort. Use the indicator as a goal to work toward during each repetition. After each set of 10 deep breaths, practice coughing to be sure your lungs are clear. If you have an incision (the cut made at the time of surgery), support your incision when coughing by placing a pillow or rolled up towels firmly against it. Once you are able to get out of bed, walk around indoors and cough well. You may stop using the incentive spirometer when instructed by your caregiver.  RISKS AND COMPLICATIONS Take  your time so you do not get dizzy or light-headed. If you are in pain, you may need to take or ask for pain medication before doing incentive spirometry. It is harder to take a deep breath if you are having pain. AFTER USE Rest and breathe slowly and easily. It can be helpful to keep track of a log of your progress. Your caregiver can provide you with a simple table to help with this. If you are using the spirometer at home, follow these instructions: SEEK MEDICAL CARE IF:  You are having difficultly using the spirometer. You have trouble using the spirometer as often as instructed. Your pain medication is not giving enough relief while using the spirometer. You develop fever of 100.5 F (38.1 C) or higher. SEEK IMMEDIATE MEDICAL CARE IF:  You cough up bloody sputum that had not been present before. You develop fever of 102 F (38.9 C) or greater. You develop worsening pain at or near the incision site. MAKE SURE YOU:  Understand these instructions. Will watch your condition. Will get help right away if you are not doing well or get worse. Document Released: 01/19/2007 Document Revised: 12/01/2011 Document Reviewed: 03/22/2007 [facility] Patient Information 2014 [place], [place].   ________________________________________________________________________

## 2023-11-18 ENCOUNTER — Other Ambulatory Visit: Payer: Self-pay

## 2023-11-18 ENCOUNTER — Ambulatory Visit: Payer: Self-pay | Admitting: Student

## 2023-11-18 ENCOUNTER — Encounter (HOSPITAL_COMMUNITY)
Admission: RE | Admit: 2023-11-18 | Discharge: 2023-11-18 | Disposition: A | Discharge status: Home / Self Care | Payer: Medicare Other | Source: Ambulatory Visit | Attending: Orthopedic Surgery

## 2023-11-18 ENCOUNTER — Encounter (HOSPITAL_COMMUNITY): Payer: Self-pay

## 2023-11-18 VITALS — BP 144/95 | HR 81 | Temp 98.1°F | Resp 18 | Ht 71.0 in | Wt 287.0 lb

## 2023-11-18 DIAGNOSIS — R9431 Abnormal electrocardiogram [ECG] [EKG]: Secondary | ICD-10-CM | POA: Insufficient documentation

## 2023-11-18 DIAGNOSIS — Z01812 Encounter for preprocedural laboratory examination: Secondary | ICD-10-CM | POA: Diagnosis present

## 2023-11-18 DIAGNOSIS — E119 Type 2 diabetes mellitus without complications: Secondary | ICD-10-CM | POA: Diagnosis not present

## 2023-11-18 DIAGNOSIS — Z01810 Encounter for preprocedural cardiovascular examination: Secondary | ICD-10-CM | POA: Diagnosis present

## 2023-11-18 DIAGNOSIS — Z01818 Encounter for other preprocedural examination: Secondary | ICD-10-CM | POA: Diagnosis not present

## 2023-11-18 LAB — CBC
HCT: 40.7 % (ref 39.0–52.0)
Hemoglobin: 14 g/dL (ref 13.0–17.0)
MCH: 32.6 pg (ref 26.0–34.0)
MCHC: 34.4 g/dL (ref 30.0–36.0)
MCV: 94.7 fL (ref 80.0–100.0)
Platelets: 219 10*3/uL (ref 150–400)
RBC: 4.3 MIL/uL (ref 4.22–5.81)
RDW: 13 % (ref 11.5–15.5)
WBC: 7 10*3/uL (ref 4.0–10.5)
nRBC: 0 % (ref 0.0–0.2)

## 2023-11-18 LAB — BASIC METABOLIC PANEL
Anion gap: 13 (ref 5–15)
BUN: 12 mg/dL (ref 8–23)
CO2: 22 mmol/L (ref 22–32)
Calcium: 9 mg/dL (ref 8.9–10.3)
Chloride: 103 mmol/L (ref 98–111)
Creatinine, Ser: 0.54 mg/dL — ABNORMAL LOW (ref 0.61–1.24)
GFR, Estimated: >60 mL/min (ref >60)
Glucose, Bld: 122 mg/dL — ABNORMAL HIGH (ref 70–99)
Potassium: 3.7 mmol/L (ref 3.5–5.1)
Sodium: 138 mmol/L (ref 135–145)

## 2023-11-18 LAB — GLUCOSE, CAPILLARY: Glucose-Capillary: 125 mg/dL — ABNORMAL HIGH (ref 70–99)

## 2023-11-18 LAB — HEMOGLOBIN A1C
Hgb A1c MFr Bld: 6.9 % — ABNORMAL HIGH (ref 4.8–5.6)
Mean Plasma Glucose: 151 mg/dL

## 2023-11-19 LAB — SURGICAL PCR SCREEN
MRSA, PCR: POSITIVE — AB
Staphylococcus aureus: POSITIVE — AB

## 2023-11-19 NOTE — Progress Notes (Signed)
 STPAH+, MRSA+ results routed to Dr. Linna Caprice

## 2023-11-20 ENCOUNTER — Ambulatory Visit: Payer: Self-pay | Admitting: Student

## 2023-11-20 MED ORDER — VANCOMYCIN HCL 10 G IV SOLR: 1000.0000 mg | Freq: Once | INTRAVENOUS | Status: AC

## 2023-11-21 DIAGNOSIS — H30032 Focal chorioretinal inflammation, peripheral, left eye: Secondary | ICD-10-CM | POA: Diagnosis not present

## 2023-11-21 DIAGNOSIS — H3581 Retinal edema: Secondary | ICD-10-CM | POA: Diagnosis not present

## 2023-11-21 DIAGNOSIS — Z961 Presence of intraocular lens: Secondary | ICD-10-CM | POA: Diagnosis not present

## 2023-11-23 NOTE — Progress Notes (Signed)
 Anesthesia Chart Review   Case: [id number] Date/Time: 11/25/23 1138   Procedure: COMPUTER ASSISTED TOTAL KNEE ARTHROPLASTY (Left: Knee) - 150 min   Anesthesia type: Spinal   Pre-op diagnosis: Left Knee Osteoarthritis   Location: [facility] ROOM 07 / [facility]   Surgeons: Samson Frederic, MD       DISCUSSION:72 y.o. smoker with h/o HTN, sleep apnea, DM II, left knee OA scheduled for above procedure 11/25/2023 with Dr. Samson Frederic.   EKG reviewed by PCP.  Per Dr. Tanya Nones, "EKG looks unchanged from 2021 and his hga1c is 6.9.  Therefore, I believe he is low risk for surgery unless he endorses unstable angina or shortness of breath."  Pt denies cv sx, reports he can climb a flight of stairs without sx.   VS: BP (!) 144/95   Pulse 81   Temp 36.7 C (Oral)   Resp 18   Ht 5\' 11"  (1.803 m)   Wt 130.2 kg   SpO2 96%   BMI 40.03 kg/m   PROVIDERS: Donita Brooks, MD is PCP    LABS: Labs reviewed: Acceptable for surgery. (all labs ordered are listed, but only abnormal results are displayed)  Labs Reviewed  SURGICAL PCR SCREEN - Abnormal; Notable for the following components:      Result Value   MRSA, PCR POSITIVE (*)    Staphylococcus aureus POSITIVE (*)    All other components within normal limits  HEMOGLOBIN A1C - Abnormal; Notable for the following components:   Hgb A1c MFr Bld 6.9 (*)    All other components within normal limits  BASIC METABOLIC PANEL - Abnormal; Notable for the following components:   Glucose, Bld 122 (*)    Creatinine, Ser 0.54 (*)    All other components within normal limits  GLUCOSE, CAPILLARY - Abnormal; Notable for the following components:   Glucose-Capillary 125 (*)    All other components within normal limits  CBC     IMAGES:   EKG:   CV:  Past Medical History:  Diagnosis Date   Arthritis    Barrett esophagus    Bladder neck obstruction    Cataract    removed in left eye   Colon polyp    Diabetes mellitus without complication (HCC)     Esophageal stricture    GERD (gastroesophageal reflux disease)    Glaucoma    History of hiatal hernia    Hyperlipidemia    Hypertension    Peyronie disease    Prosthetic eye globe    Right eye   Sleep apnea    does not use machine per pt.   Smoker    Wears glasses     Past Surgical History:  Procedure Laterality Date   BACK SURGERY     lumbar decompression and fusion (Dr. 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    vancomycin (VANCOCIN) 1,000 mg in sodium chloride 0.9 % 500 mL IVPB   Jodell Cipro Ward, PA-C [facility] Pre-Surgical Testing [phone]

## 2023-11-25 ENCOUNTER — Ambulatory Visit (HOSPITAL_BASED_OUTPATIENT_CLINIC_OR_DEPARTMENT_OTHER)

## 2023-11-25 ENCOUNTER — Ambulatory Visit (HOSPITAL_COMMUNITY): Payer: Self-pay | Admitting: Physician Assistant

## 2023-11-25 ENCOUNTER — Encounter (HOSPITAL_COMMUNITY): Payer: Self-pay | Admitting: Orthopedic Surgery

## 2023-11-25 ENCOUNTER — Ambulatory Visit (HOSPITAL_COMMUNITY)

## 2023-11-25 ENCOUNTER — Ambulatory Visit (HOSPITAL_COMMUNITY)
Admission: RE | Admit: 2023-11-25 | Discharge: 2023-11-26 | Disposition: A | Discharge status: Home / Self Care | Payer: Medicare Other | Source: Ambulatory Visit | Attending: Orthopedic Surgery | Admitting: Orthopedic Surgery

## 2023-11-25 ENCOUNTER — Other Ambulatory Visit: Payer: Self-pay

## 2023-11-25 ENCOUNTER — Encounter (HOSPITAL_COMMUNITY)
Admission: RE | Disposition: A | Discharge status: Home / Self Care | Payer: Self-pay | Source: Ambulatory Visit | Attending: Orthopedic Surgery

## 2023-11-25 DIAGNOSIS — G4730 Sleep apnea, unspecified: Secondary | ICD-10-CM | POA: Insufficient documentation

## 2023-11-25 DIAGNOSIS — M1712 Unilateral primary osteoarthritis, left knee: Secondary | ICD-10-CM

## 2023-11-25 DIAGNOSIS — I10 Essential (primary) hypertension: Secondary | ICD-10-CM | POA: Diagnosis not present

## 2023-11-25 DIAGNOSIS — Z79899 Other long term (current) drug therapy: Secondary | ICD-10-CM | POA: Diagnosis not present

## 2023-11-25 DIAGNOSIS — E66813 Obesity, class 3: Secondary | ICD-10-CM | POA: Insufficient documentation

## 2023-11-25 DIAGNOSIS — E119 Type 2 diabetes mellitus without complications: Secondary | ICD-10-CM

## 2023-11-25 DIAGNOSIS — Z7984 Long term (current) use of oral hypoglycemic drugs: Secondary | ICD-10-CM | POA: Diagnosis not present

## 2023-11-25 DIAGNOSIS — K219 Gastro-esophageal reflux disease without esophagitis: Secondary | ICD-10-CM | POA: Diagnosis not present

## 2023-11-25 DIAGNOSIS — R609 Edema, unspecified: Secondary | ICD-10-CM | POA: Diagnosis not present

## 2023-11-25 DIAGNOSIS — F17210 Nicotine dependence, cigarettes, uncomplicated: Secondary | ICD-10-CM | POA: Diagnosis not present

## 2023-11-25 DIAGNOSIS — K449 Diaphragmatic hernia without obstruction or gangrene: Secondary | ICD-10-CM | POA: Diagnosis not present

## 2023-11-25 DIAGNOSIS — Z6839 Body mass index (BMI) 39.0-39.9, adult: Secondary | ICD-10-CM | POA: Diagnosis not present

## 2023-11-25 DIAGNOSIS — Z471 Aftercare following joint replacement surgery: Secondary | ICD-10-CM | POA: Diagnosis not present

## 2023-11-25 DIAGNOSIS — Z96652 Presence of left artificial knee joint: Secondary | ICD-10-CM | POA: Diagnosis not present

## 2023-11-25 DIAGNOSIS — G8918 Other acute postprocedural pain: Secondary | ICD-10-CM | POA: Diagnosis not present

## 2023-11-25 HISTORY — PX: KNEE ARTHROPLASTY: SHX992

## 2023-11-25 LAB — HEMOGLOBIN A1C
Hgb A1c MFr Bld: 6.4 % — ABNORMAL HIGH (ref 4.8–5.6)
Mean Plasma Glucose: 136.98 mg/dL

## 2023-11-25 LAB — GLUCOSE, CAPILLARY
Glucose-Capillary: 138 mg/dL — ABNORMAL HIGH (ref 70–99)
Glucose-Capillary: 152 mg/dL — ABNORMAL HIGH (ref 70–99)
Glucose-Capillary: 153 mg/dL — ABNORMAL HIGH (ref 70–99)

## 2023-11-25 SURGERY — ARTHROPLASTY, KNEE, TOTAL, USING IMAGELESS COMPUTER-ASSISTED NAVIGATION
Anesthesia: Spinal | Site: Knee | Laterality: Left

## 2023-11-25 MED ORDER — FENOFIBRATE 160 MG PO TABS
160.0000 mg | ORAL_TABLET | Freq: Every day | ORAL | Status: DC
  Administered 2023-11-26: 160 mg via ORAL
  Filled 2023-11-25: qty 1

## 2023-11-25 MED ORDER — MENTHOL 3 MG MT LOZG: 1.0000 | LOZENGE | OROMUCOSAL | Status: DC | PRN

## 2023-11-25 MED ORDER — VANCOMYCIN HCL IN DEXTROSE 1-5 GM/200ML-% IV SOLN
1000.0000 mg | Freq: Two times a day (BID) | INTRAVENOUS | Status: AC
  Administered 2023-11-25: 1000 mg via INTRAVENOUS
  Filled 2023-11-25: qty 200

## 2023-11-25 MED ORDER — MUPIROCIN 2 % EX OINT: 1.0000 | TOPICAL_OINTMENT | Freq: Two times a day (BID) | CUTANEOUS | 0 refills | Status: AC

## 2023-11-25 MED ORDER — ALLOPURINOL 300 MG PO TABS
300.0000 mg | ORAL_TABLET | Freq: Every day | ORAL | Status: DC
  Administered 2023-11-26: 300 mg via ORAL
  Filled 2023-11-25: qty 1

## 2023-11-25 MED ORDER — SODIUM CHLORIDE (PF) 0.9 % IJ SOLN
INTRAMUSCULAR | Status: AC
  Filled 2023-11-25: qty 30

## 2023-11-25 MED ORDER — ISOPROPYL ALCOHOL 70 % SOLN
Status: AC
  Filled 2023-11-25: qty 480

## 2023-11-25 MED ORDER — SENNA 8.6 MG PO TABS
1.0000 | ORAL_TABLET | Freq: Two times a day (BID) | ORAL | Status: DC
  Administered 2023-11-25 – 2023-11-26 (×2): 8.6 mg via ORAL
  Filled 2023-11-25 (×2): qty 1

## 2023-11-25 MED ORDER — DEXAMETHASONE SODIUM PHOSPHATE 4 MG/ML IJ SOLN
INTRAMUSCULAR | Status: DC | PRN
  Administered 2023-11-25: 4 mg via PERINEURAL

## 2023-11-25 MED ORDER — MIDAZOLAM HCL 2 MG/2ML IJ SOLN: 1.0000 mg | INTRAMUSCULAR | Status: DC

## 2023-11-25 MED ORDER — OXYCODONE HCL 5 MG PO TABS: 10.0000 mg | ORAL_TABLET | ORAL | Status: DC | PRN

## 2023-11-25 MED ORDER — LACTATED RINGERS IV SOLN: INTRAVENOUS | Status: DC

## 2023-11-25 MED ORDER — PROPOFOL 1000 MG/100ML IV EMUL
INTRAVENOUS | Status: AC
  Filled 2023-11-25: qty 100

## 2023-11-25 MED ORDER — DEXAMETHASONE SODIUM PHOSPHATE 10 MG/ML IJ SOLN
INTRAMUSCULAR | Status: AC
  Filled 2023-11-25: qty 1

## 2023-11-25 MED ORDER — OXYCODONE HCL 5 MG PO TABS: 5.0000 mg | ORAL_TABLET | ORAL | Status: DC | PRN

## 2023-11-25 MED ORDER — METOCLOPRAMIDE HCL 5 MG PO TABS: 5.0000 mg | ORAL_TABLET | Freq: Three times a day (TID) | ORAL | Status: DC | PRN

## 2023-11-25 MED ORDER — PROPOFOL 10 MG/ML IV BOLUS
INTRAVENOUS | Status: DC | PRN
  Administered 2023-11-25: 20 mg via INTRAVENOUS
  Administered 2023-11-25: 50 mg via INTRAVENOUS

## 2023-11-25 MED ORDER — CLONIDINE HCL (ANALGESIA) 100 MCG/ML EP SOLN
EPIDURAL | Status: DC | PRN
  Administered 2023-11-25: 60 ug

## 2023-11-25 MED ORDER — KETOROLAC TROMETHAMINE 30 MG/ML IJ SOLN
INTRAMUSCULAR | Status: DC | PRN
  Administered 2023-11-25: 30 mg via INTRAVENOUS

## 2023-11-25 MED ORDER — LIDOCAINE HCL (PF) 2 % IJ SOLN
INTRAMUSCULAR | Status: AC
  Filled 2023-11-25: qty 5

## 2023-11-25 MED ORDER — DIPHENHYDRAMINE HCL 12.5 MG/5ML PO ELIX: 12.5000 mg | ORAL_SOLUTION | ORAL | Status: DC | PRN

## 2023-11-25 MED ORDER — SODIUM CHLORIDE 0.9 % IR SOLN
Status: DC | PRN
  Administered 2023-11-25: 1000 mL

## 2023-11-25 MED ORDER — LATANOPROST 0.005 % OP SOLN
1.0000 [drp] | Freq: Every day | OPHTHALMIC | Status: DC
  Administered 2023-11-25: 1 [drp] via OPHTHALMIC
  Filled 2023-11-25: qty 2.5

## 2023-11-25 MED ORDER — SODIUM CHLORIDE (PF) 0.9 % IJ SOLN
INTRAMUSCULAR | Status: DC | PRN
  Administered 2023-11-25: 30 mL

## 2023-11-25 MED ORDER — ONDANSETRON HCL 4 MG/2ML IJ SOLN
INTRAMUSCULAR | Status: AC
  Filled 2023-11-25: qty 2

## 2023-11-25 MED ORDER — POLYETHYLENE GLYCOL 3350 17 G PO PACK: 17.0000 g | PACK | Freq: Every day | ORAL | Status: DC | PRN

## 2023-11-25 MED ORDER — ACETAMINOPHEN 500 MG PO TABS
1000.0000 mg | ORAL_TABLET | Freq: Once | ORAL | Status: DC
  Filled 2023-11-25: qty 2

## 2023-11-25 MED ORDER — OXYCODONE HCL 5 MG PO TABS
ORAL_TABLET | ORAL | Status: AC
  Administered 2023-11-25: 5 mg via ORAL
  Filled 2023-11-25: qty 1

## 2023-11-25 MED ORDER — ISOPROPYL ALCOHOL 70 % SOLN
Status: DC | PRN
  Administered 2023-11-25: 1 via TOPICAL

## 2023-11-25 MED ORDER — KETOROLAC TROMETHAMINE 15 MG/ML IJ SOLN
INTRAMUSCULAR | Status: AC
  Administered 2023-11-25: 7.5 mg via INTRAVENOUS
  Filled 2023-11-25: qty 1

## 2023-11-25 MED ORDER — INSULIN ASPART 100 UNIT/ML IJ SOLN
0.0000 [IU] | Freq: Three times a day (TID) | INTRAMUSCULAR | Status: DC
  Administered 2023-11-26: 3 [IU] via SUBCUTANEOUS

## 2023-11-25 MED ORDER — 0.9 % SODIUM CHLORIDE (POUR BTL) OPTIME
TOPICAL | Status: DC | PRN
  Administered 2023-11-25: 1000 mL

## 2023-11-25 MED ORDER — POVIDONE-IODINE 10 % EX SWAB: 2.0000 | Freq: Once | CUTANEOUS | Status: DC

## 2023-11-25 MED ORDER — CHLORHEXIDINE GLUCONATE 0.12 % MT SOLN
15.0000 mL | Freq: Once | OROMUCOSAL | Status: AC
  Administered 2023-11-25: 15 mL via OROMUCOSAL

## 2023-11-25 MED ORDER — LIDOCAINE 2% (20 MG/ML) 5 ML SYRINGE
INTRAMUSCULAR | Status: DC | PRN
  Administered 2023-11-25: 80 mg via INTRAVENOUS

## 2023-11-25 MED ORDER — METHOCARBAMOL 1000 MG/10ML IJ SOLN: 500.0000 mg | Freq: Four times a day (QID) | INTRAMUSCULAR | Status: DC | PRN

## 2023-11-25 MED ORDER — PHENOL 1.4 % MT LIQD: 1.0000 | OROMUCOSAL | Status: DC | PRN

## 2023-11-25 MED ORDER — TRANEXAMIC ACID-NACL 1000-0.7 MG/100ML-% IV SOLN
1000.0000 mg | INTRAVENOUS | Status: AC
  Administered 2023-11-25: 1000 mg via INTRAVENOUS
  Filled 2023-11-25: qty 100

## 2023-11-25 MED ORDER — KETOROLAC TROMETHAMINE 15 MG/ML IJ SOLN
INTRAMUSCULAR | Status: AC
Start: 2023-11-25 — End: 2023-11-26
  Filled 2023-11-25: qty 1

## 2023-11-25 MED ORDER — METOCLOPRAMIDE HCL 5 MG/ML IJ SOLN: 5.0000 mg | Freq: Three times a day (TID) | INTRAMUSCULAR | Status: DC | PRN

## 2023-11-25 MED ORDER — HYDROMORPHONE HCL 1 MG/ML IJ SOLN: 0.5000 mg | INTRAMUSCULAR | Status: DC | PRN

## 2023-11-25 MED ORDER — SODIUM CHLORIDE 0.9 % IV SOLN: INTRAVENOUS | Status: DC

## 2023-11-25 MED ORDER — INSULIN ASPART 100 UNIT/ML IJ SOLN: 0.0000 [IU] | INTRAMUSCULAR | Status: DC | PRN

## 2023-11-25 MED ORDER — KETOROLAC TROMETHAMINE 15 MG/ML IJ SOLN
7.5000 mg | Freq: Four times a day (QID) | INTRAMUSCULAR | Status: DC
  Administered 2023-11-26 (×2): 7.5 mg via INTRAVENOUS
  Filled 2023-11-25 (×2): qty 1

## 2023-11-25 MED ORDER — PROPOFOL 10 MG/ML IV BOLUS
INTRAVENOUS | Status: AC
  Filled 2023-11-25: qty 20

## 2023-11-25 MED ORDER — METHOCARBAMOL 500 MG PO TABS
500.0000 mg | ORAL_TABLET | Freq: Four times a day (QID) | ORAL | Status: DC | PRN
  Administered 2023-11-25: 500 mg via ORAL
  Filled 2023-11-25: qty 1

## 2023-11-25 MED ORDER — DEXAMETHASONE SODIUM PHOSPHATE 10 MG/ML IJ SOLN
10.0000 mg | Freq: Once | INTRAMUSCULAR | Status: AC
  Administered 2023-11-26: 10 mg via INTRAVENOUS
  Filled 2023-11-25: qty 1

## 2023-11-25 MED ORDER — BUPIVACAINE-EPINEPHRINE 0.25% -1:200000 IJ SOLN
INTRAMUSCULAR | Status: DC | PRN
  Administered 2023-11-25: 30 mL

## 2023-11-25 MED ORDER — PRAVASTATIN SODIUM 20 MG PO TABS
40.0000 mg | ORAL_TABLET | Freq: Every day | ORAL | Status: DC
  Administered 2023-11-26: 40 mg via ORAL
  Filled 2023-11-25: qty 2

## 2023-11-25 MED ORDER — PROPOFOL 500 MG/50ML IV EMUL
INTRAVENOUS | Status: DC | PRN
  Administered 2023-11-25: 65 ug/kg/min via INTRAVENOUS

## 2023-11-25 MED ORDER — CEFAZOLIN SODIUM-DEXTROSE 3-4 GM/150ML-% IV SOLN
3.0000 g | INTRAVENOUS | Status: AC
  Administered 2023-11-25: 3 g via INTRAVENOUS
  Filled 2023-11-25: qty 150

## 2023-11-25 MED ORDER — PREDNISOLONE ACETATE 1 % OP SUSP
1.0000 [drp] | Freq: Four times a day (QID) | OPHTHALMIC | Status: DC
  Administered 2023-11-25 – 2023-11-26 (×3): 1 [drp] via OPHTHALMIC
  Filled 2023-11-25: qty 5

## 2023-11-25 MED ORDER — ACETAMINOPHEN 325 MG PO TABS: 325.0000 mg | ORAL_TABLET | Freq: Four times a day (QID) | ORAL | Status: DC | PRN

## 2023-11-25 MED ORDER — HYDROMORPHONE HCL 1 MG/ML IJ SOLN
INTRAMUSCULAR | Status: AC
  Administered 2023-11-25: 0.5 mg via INTRAVENOUS
  Filled 2023-11-25: qty 2

## 2023-11-25 MED ORDER — KETOROLAC TROMETHAMINE 30 MG/ML IJ SOLN
INTRAMUSCULAR | Status: AC
  Filled 2023-11-25: qty 1

## 2023-11-25 MED ORDER — ACETAMINOPHEN 500 MG PO TABS
1000.0000 mg | ORAL_TABLET | Freq: Once | ORAL | Status: AC
  Administered 2023-11-25: 1000 mg via ORAL

## 2023-11-25 MED ORDER — ALUM & MAG HYDROXIDE-SIMETH 200-200-20 MG/5ML PO SUSP: 30.0000 mL | ORAL | Status: DC | PRN

## 2023-11-25 MED ORDER — HYDROMORPHONE HCL 1 MG/ML IJ SOLN
0.2500 mg | INTRAMUSCULAR | Status: DC | PRN
  Administered 2023-11-25: 0.5 mg via INTRAVENOUS

## 2023-11-25 MED ORDER — DORZOLAMIDE HCL-TIMOLOL MAL 2-0.5 % OP SOLN
1.0000 [drp] | Freq: Two times a day (BID) | OPHTHALMIC | Status: DC
  Administered 2023-11-25 – 2023-11-26 (×2): 1 [drp] via OPHTHALMIC
  Filled 2023-11-25: qty 10

## 2023-11-25 MED ORDER — DEXAMETHASONE SODIUM PHOSPHATE 10 MG/ML IJ SOLN
INTRAMUSCULAR | Status: DC | PRN
  Administered 2023-11-25: 8 mg via INTRAVENOUS

## 2023-11-25 MED ORDER — ROPIVACAINE HCL 5 MG/ML IJ SOLN
INTRAMUSCULAR | Status: DC | PRN
  Administered 2023-11-25: 20 mL via PERINEURAL

## 2023-11-25 MED ORDER — ONDANSETRON HCL 4 MG/2ML IJ SOLN
INTRAMUSCULAR | Status: DC | PRN
  Administered 2023-11-25: 4 mg via INTRAVENOUS

## 2023-11-25 MED ORDER — DROPERIDOL 2.5 MG/ML IJ SOLN: 0.6250 mg | Freq: Once | INTRAMUSCULAR | Status: DC | PRN

## 2023-11-25 MED ORDER — INSULIN ASPART 100 UNIT/ML IJ SOLN: 0.0000 [IU] | Freq: Every day | INTRAMUSCULAR | Status: DC

## 2023-11-25 MED ORDER — DOCUSATE SODIUM 100 MG PO CAPS
100.0000 mg | ORAL_CAPSULE | Freq: Two times a day (BID) | ORAL | Status: DC
Start: 2023-11-25 — End: 2023-11-26
  Administered 2023-11-25 – 2023-11-26 (×2): 100 mg via ORAL
  Filled 2023-11-25 (×2): qty 1

## 2023-11-25 MED ORDER — VANCOMYCIN HCL 1500 MG/300ML IV SOLN
1500.0000 mg | Freq: Once | INTRAVENOUS | Status: AC
  Administered 2023-11-25: 1500 mg via INTRAVENOUS
  Filled 2023-11-25: qty 300

## 2023-11-25 MED ORDER — PHENYLEPHRINE HCL-NACL 20-0.9 MG/250ML-% IV SOLN
INTRAVENOUS | Status: DC | PRN
Start: 2023-11-25 — End: 2023-11-25
  Administered 2023-11-25: 25 ug/min via INTRAVENOUS

## 2023-11-25 MED ORDER — ONDANSETRON HCL 4 MG/2ML IJ SOLN: 4.0000 mg | Freq: Four times a day (QID) | INTRAMUSCULAR | Status: DC | PRN

## 2023-11-25 MED ORDER — PANTOPRAZOLE SODIUM 40 MG PO TBEC
40.0000 mg | DELAYED_RELEASE_TABLET | Freq: Every day | ORAL | Status: DC
  Administered 2023-11-26: 40 mg via ORAL
  Filled 2023-11-25: qty 1

## 2023-11-25 MED ORDER — ACETAMINOPHEN 500 MG PO TABS
1000.0000 mg | ORAL_TABLET | Freq: Four times a day (QID) | ORAL | Status: DC
  Administered 2023-11-25 – 2023-11-26 (×3): 1000 mg via ORAL
  Filled 2023-11-25 (×3): qty 2

## 2023-11-25 MED ORDER — FENTANYL CITRATE PF 50 MCG/ML IJ SOSY
50.0000 ug | PREFILLED_SYRINGE | INTRAMUSCULAR | Status: DC
  Administered 2023-11-25: 50 ug via INTRAVENOUS
  Filled 2023-11-25: qty 2

## 2023-11-25 MED ORDER — ASPIRIN 81 MG PO CHEW
81.0000 mg | CHEWABLE_TABLET | Freq: Two times a day (BID) | ORAL | Status: DC
  Administered 2023-11-25 – 2023-11-26 (×2): 81 mg via ORAL
  Filled 2023-11-25 (×2): qty 1

## 2023-11-25 MED ORDER — ORAL CARE MOUTH RINSE: 15.0000 mL | Freq: Once | OROMUCOSAL | Status: AC

## 2023-11-25 MED ORDER — ONDANSETRON HCL 4 MG PO TABS: 4.0000 mg | ORAL_TABLET | Freq: Four times a day (QID) | ORAL | Status: DC | PRN

## 2023-11-25 MED ORDER — BUPIVACAINE IN DEXTROSE 0.75-8.25 % IT SOLN
INTRATHECAL | Status: DC | PRN
  Administered 2023-11-25: 2 mL via INTRATHECAL

## 2023-11-25 MED ORDER — CHLORHEXIDINE GLUCONATE 4 % EX SOLN: 1.0000 | CUTANEOUS | 1 refills | Status: AC

## 2023-11-25 MED ORDER — LACTATED RINGERS IV SOLN
INTRAVENOUS | Status: DC
Start: 2023-11-25 — End: 2023-11-25

## 2023-11-25 MED ORDER — BUPIVACAINE-EPINEPHRINE (PF) 0.25% -1:200000 IJ SOLN
INTRAMUSCULAR | Status: AC
  Filled 2023-11-25: qty 30

## 2023-11-25 SURGICAL SUPPLY — 59 items
BAG COUNTER SPONGE SURGICOUNT (BAG) IMPLANT
BAG ZIPLOCK 12X15 (MISCELLANEOUS) IMPLANT
BATTERY INSTRU NAVIGATION (MISCELLANEOUS) ×3 IMPLANT
BLADE SAW RECIPROCATING 77.5 (BLADE) ×1 IMPLANT
BNDG ELASTIC 4INX 5YD STR LF (GAUZE/BANDAGES/DRESSINGS) ×1 IMPLANT
BNDG ELASTIC 6INX 5YD STR LF (GAUZE/BANDAGES/DRESSINGS) ×1 IMPLANT
CHLORAPREP W/TINT 26 (MISCELLANEOUS) ×2 IMPLANT
COMP FEM PS STD 11 LT (Joint) ×1 IMPLANT
COMP PATELLA 3 PEG 38 (Joint) ×1 IMPLANT
COMP TIB PS G 0D LT (Joint) ×1 IMPLANT
COMPONENT FEM PS STD 11 LT (Joint) IMPLANT
COMPONENT PATELLA 3 PEG 38 (Joint) IMPLANT
COMPONET TIB PS G 0D LT (Joint) IMPLANT
COVER SURGICAL LIGHT HANDLE (MISCELLANEOUS) ×1 IMPLANT
DERMABOND ADVANCED .7 DNX12 (GAUZE/BANDAGES/DRESSINGS) ×2 IMPLANT
DRAPE SHEET LG 3/4 BI-LAMINATE (DRAPES) ×3 IMPLANT
DRAPE U-SHAPE 47X51 STRL (DRAPES) ×1 IMPLANT
DRSG AQUACEL AG ADV 3.5X10 (GAUZE/BANDAGES/DRESSINGS) ×1 IMPLANT
ELECT BLADE TIP CTD 4 INCH (ELECTRODE) ×1 IMPLANT
ELECT REM PT RETURN 15FT ADLT (MISCELLANEOUS) ×1 IMPLANT
GAUZE SPONGE 4X4 12PLY STRL (GAUZE/BANDAGES/DRESSINGS) ×1 IMPLANT
GLOVE BIO SURGEON STRL SZ7 (GLOVE) ×1 IMPLANT
GLOVE BIO SURGEON STRL SZ8.5 (GLOVE) ×2 IMPLANT
GLOVE BIOGEL PI IND STRL 7.5 (GLOVE) ×1 IMPLANT
GLOVE BIOGEL PI IND STRL 8.5 (GLOVE) ×1 IMPLANT
GOWN SPEC L3 XXLG W/TWL (GOWN DISPOSABLE) ×1 IMPLANT
GOWN STRL REUS W/ TWL XL LVL3 (GOWN DISPOSABLE) ×1 IMPLANT
HOLDER FOLEY CATH W/STRAP (MISCELLANEOUS) ×1 IMPLANT
HOOD PEEL AWAY T7 (MISCELLANEOUS) ×3 IMPLANT
INSERT TIBIA ARTIC SZ 8-11 13 (Joint) IMPLANT
KIT TURNOVER KIT A (KITS) IMPLANT
MARKER SKIN DUAL TIP RULER LAB (MISCELLANEOUS) ×1 IMPLANT
NDL SAFETY ECLIPSE 18X1.5 (NEEDLE) ×1 IMPLANT
NDL SPNL 18GX3.5 QUINCKE PK (NEEDLE) ×1 IMPLANT
NEEDLE SPNL 18GX3.5 QUINCKE PK (NEEDLE) ×1 IMPLANT
NS IRRIG 1000ML POUR BTL (IV SOLUTION) ×1 IMPLANT
PACK TOTAL KNEE CUSTOM (KITS) ×1 IMPLANT
PADDING CAST COTTON 6X4 STRL (CAST SUPPLIES) ×1 IMPLANT
PIN DRILL HDLS TROCAR 75 4PK (PIN) IMPLANT
PROTECTOR NERVE ULNAR (MISCELLANEOUS) ×1 IMPLANT
SAW OSC TIP CART 19.5X105X1.3 (SAW) ×1 IMPLANT
SCREW FEMALE HEX FIX 25X2.5 (ORTHOPEDIC DISPOSABLE SUPPLIES) IMPLANT
SEALER BIPOLAR AQUA 6.0 (INSTRUMENTS) ×1 IMPLANT
SET HNDPC FAN SPRY TIP SCT (DISPOSABLE) ×1 IMPLANT
SET PAD KNEE POSITIONER (MISCELLANEOUS) ×1 IMPLANT
SOLUTION PRONTOSAN WOUND 350ML (IRRIGATION / IRRIGATOR) ×1 IMPLANT
SPIKE FLUID TRANSFER (MISCELLANEOUS) ×2 IMPLANT
SUT MNCRL AB 3-0 PS2 18 (SUTURE) ×1 IMPLANT
SUT MON AB 2-0 CT1 36 (SUTURE) ×1 IMPLANT
SUT STRATAFIX 14 PDO 48 VLT (SUTURE) ×1 IMPLANT
SUT STRATAFIX PDO 1 14 VIOLET (SUTURE) ×1 IMPLANT
SUT VIC AB 1 CTX36XBRD ANBCTR (SUTURE) ×2 IMPLANT
SUT VIC AB 2-0 CT1 TAPERPNT 27 (SUTURE) ×1 IMPLANT
SYR 3ML LL SCALE MARK (SYRINGE) ×1 IMPLANT
TOWEL GREEN STERILE FF (TOWEL DISPOSABLE) ×1 IMPLANT
TRAY FOLEY MTR SLVR 16FR STAT (SET/KITS/TRAYS/PACK) IMPLANT
TUBE SUCTION HIGH CAP CLEAR NV (SUCTIONS) ×1 IMPLANT
WATER STERILE IRR 1000ML POUR (IV SOLUTION) ×2 IMPLANT
WRAP KNEE MAXI GEL POST OP (GAUZE/BANDAGES/DRESSINGS) IMPLANT

## 2023-11-25 NOTE — Anesthesia Procedure Notes (Signed)
 Spinal  Patient location during procedure: OR Start time: 11/25/2023 11:18 AM End time: 11/25/2023 11:21 AM Reason for block: surgical anesthesia Staffing Performed: anesthesiologist  Anesthesiologist: Lewie Loron, MD Performed by: Lewie Loron, MD Authorized by: Lewie Loron, MD   Preanesthetic Checklist Completed: patient identified, IV checked, site marked, risks and benefits discussed, surgical consent, monitors and equipment checked, pre-op evaluation and timeout performed Spinal Block Patient position: sitting Prep: DuraPrep and site prepped and draped Patient monitoring: heart rate, continuous pulse ox and blood pressure Approach: right paramedian Location: L3-4 Injection technique: single-shot Needle Needle type: Spinocan  Needle gauge: 25 G Needle length: 9 cm Additional Notes Expiration date of kit checked and confirmed. Patient tolerated procedure well, without complications.

## 2023-11-25 NOTE — Care Plan (Signed)
 Ortho Bundle Case Management Note  Patient Details  Name: CARLIE CORPUS MRN: [id number] Date of Birth: Mar 03, 1951  L TKA on 11-25-23 DCP:  Home with dtr DME:  RW ordered through [facility] PT:  [facility] on 11-30-23                   DME Arranged:  Dan Humphreys rolling DME Agency:  [employer]  HH Arranged:  NA HH Agency:  NA  Additional Comments: Please contact me with any questions of if this plan should need to change.  Ennis Forts, RN,CCM [facility]  [phone] 11/25/2023, 4:45 PM

## 2023-11-25 NOTE — Op Note (Signed)
 OPERATIVE REPORT  SURGEON: Samson Frederic, MD   ASSISTANT: Christen Butter, PA-C  PREOPERATIVE DIAGNOSIS: Primary Left knee arthritis.   POSTOPERATIVE DIAGNOSIS: Primary Left knee arthritis.   PROCEDURE: Computer assisted Left total knee arthroplasty.   IMPLANTS: Zimmer Persona PPS Cementless CR femur, size 11. Persona 0 degree Spiked Keel OsseoTi Tibia, size G. Vivacit-E polyethelyene insert, size 13 mm, MC. OsseoTi 3-Peg patella, size 13 mm.  ANESTHESIA:  MAC, Regional, and Spinal  TOURNIQUET TIME: Not utilized.   ESTIMATED BLOOD LOSS:-500 mL    ANTIBIOTICS: 1g vancomycin. 2g Ancef.  DRAINS: None.  COMPLICATIONS: None   CONDITION: PACU - hemodynamically stable.   BRIEF CLINICAL NOTE: [name] is a 73 y.o. male with a long-standing history of Left knee arthritis. After failing conservative management, the patient was indicated for total knee arthroplasty. The risks, benefits, and alternatives to the procedure were explained, and the patient elected to proceed.  PROCEDURE IN DETAIL: Adductor canal block was obtained in the pre-op holding area. Once inside the operative room, spinal anesthesia was obtained, and a foley catheter was inserted. The patient was then positioned and the lower extremity was prepped and draped in the normal sterile surgical fashion.  A time-out was called verifying side and site of surgery. The patient received IV antibiotics within 60 minutes of beginning the procedure. A tourniquet was not utilized.   An anterior approach to the knee was performed utilizing a midvastus arthrotomy. A medial release was performed and the patellar fat pad was excised. Stryker imageless navigation was used to cut the distal femur perpendicular to the mechanical axis. A freehand patellar resection was performed, and the patella was sized an prepared with 3 lug holes.  Nagivation was used to make a neutral proximal tibia resection, taking 3 mm of bone from the less  affected medial side with 3 degrees of slope. The menisci were excised. A spacer block was placed, and the alignment and balance in extension were confirmed.   The distal femur was sized using the 3-degree external rotation guide referencing the posterior femoral cortex. The appropriate 4-in-1 cutting block was pinned into place. Rotation was checked using Whiteside's line, the epicondylar axis, and then confirmed with a spacer block in flexion. The remaining femoral cuts were performed, taking care to protect the MCL.  The tibia was sized and the trial tray was pinned into place. The remaining trail components were inserted. The knee was stable to varus and valgus stress through a full range of motion. The patella tracked centrally, and the PCL was well balanced. The trial components were removed, and the proximal tibial surface was prepared. Final components were impacted into place. The knee was tested for a final time and found to be well balanced.   The wound was copiously irrigated with Prontosan solution and normal saline using pulse lavage.  Marcaine solution was injected into the periarticular soft tissue.  The wound was closed in layers using #1 Vicryl and Stratafix for the fascia, 2-0 Vicryl for the subcutaneous fat, 2-0 Monocryl for the deep dermal layer, 3-0 running Monocryl subcuticular Stitch, and 4-0 Monocryl stay sutures at both ends of the wound. Dermabond was applied to the skin.  Once the glue was fully dried, an Aquacell Ag and compressive dressing were applied.  The patient was transported to the recovery room in stable condition.  Sponge, needle, and instrument counts were correct at the end of the case x2.  The patient tolerated the procedure well and there were no  known complications.  The aquamantis was utilized for this case to help facilitate better hemostasis as patient was felt to be at increased risk of bleeding because of complex case requiring increased OR time and/or  exposure.  -minimally invasive approach.  A oscillating saw tip was utilized for this case to prevent damage to the soft tissue structures such as muscles, ligaments and tendons, and to ensure accurate bone cuts. This patient was at increased risk for above structures due to  minimally invasive approach.  Please note that a surgical assistant was a medical necessity for this procedure in order to perform it in a safe and expeditious manner. Surgical assistant was necessary to retract the ligaments and vital neurovascular structures to prevent injury to them and also necessary for proper positioning of the limb to allow for anatomic placement of the prosthesis.

## 2023-11-25 NOTE — Anesthesia Procedure Notes (Signed)
 Anesthesia Regional Block: Adductor canal block   Pre-Anesthetic Checklist: , timeout performed,  Correct Patient, Correct Site, Correct Laterality,  Correct Procedure, Correct Position, site marked,  Risks and benefits discussed,  Surgical consent,  Pre-op evaluation,  At surgeon's request and post-op pain management  Laterality: Lower and Left  Prep: chloraprep       Needles:  Injection technique: Single-shot  Needle Type: Stimiplex     Needle Length: 9cm  Needle Gauge: 21     Additional Needles:   Procedures:,,,, ultrasound used (permanent image in chart),,    Narrative:  Start time: 11/25/2023 10:43 AM End time: 11/25/2023 11:03 AM Injection made incrementally with aspirations every 5 mL.  Performed by: Personally  Anesthesiologist: Lewie Loron, MD  Additional Notes: BP cuff, EKG monitors applied. Sedation begun. Artery and nerve location verified with ultrasound. Anesthetic injected incrementally (5ml), slowly, and after negative aspirations under direct u/s guidance. Good fascial/perineural spread. Tolerated well.

## 2023-11-25 NOTE — Anesthesia Preprocedure Evaluation (Addendum)
 Anesthesia Evaluation  Patient identified by MRN, date of birth, ID band Patient awake    Reviewed: Allergy & Precautions, NPO status , Patient's Chart, lab work & pertinent test results  Airway Mallampati: II  TM Distance: >3 FB Neck ROM: Full    Dental  (+) Dental Advisory Given, Teeth Intact   Pulmonary sleep apnea , Current Smoker and Patient abstained from smoking.   Pulmonary exam normal breath sounds clear to auscultation       Cardiovascular Exercise Tolerance: Good hypertension, Pt. on medications (-) angina Normal cardiovascular exam Rhythm:Regular Rate:Normal     Neuro/Psych negative neurological ROS  negative psych ROS   GI/Hepatic hiatal hernia,GERD  Medicated,,(+)     substance abuse  alcohol use  Endo/Other  diabetes  Class 3 obesity  Renal/GU negative Renal ROS     Musculoskeletal  (+) Arthritis ,    Abdominal  (+) + obese  Peds  Hematology negative hematology ROS (+)   Anesthesia Other Findings   Reproductive/Obstetrics                              Anesthesia Physical Anesthesia Plan  ASA: 3  Anesthesia Plan: Spinal   Post-op Pain Management: Regional block* and Tylenol PO (pre-op)*   Induction: Intravenous  PONV Risk Score and Plan: 2 and Ondansetron, Dexamethasone, Treatment may vary due to age or medical condition, Propofol infusion and TIVA  Airway Management Planned:   Additional Equipment: None  Intra-op Plan:   Post-operative Plan:   Informed Consent: I have reviewed the patients History and Physical, chart, labs and discussed the procedure including the risks, benefits and alternatives for the proposed anesthesia with the patient or authorized representative who has indicated his/her understanding and acceptance.     Dental advisory given  Plan Discussed with: CRNA  Anesthesia Plan Comments:          Anesthesia Quick Evaluation

## 2023-11-25 NOTE — Anesthesia Postprocedure Evaluation (Signed)
 Anesthesia Post Note  Patient: [name]  Procedure(s) Performed: ARTHROPLASTY, KNEE, TOTAL, USING IMAGELESS COMPUTER-ASSISTED NAVIGATION (Left: Knee)     Patient location during evaluation: PACU Anesthesia Type: Spinal Level of consciousness: oriented and awake and alert Pain management: pain level controlled Vital Signs Assessment: post-procedure vital signs reviewed and stable Respiratory status: spontaneous breathing, respiratory function stable and nonlabored ventilation Cardiovascular status: blood pressure returned to baseline and stable Postop Assessment: no headache, no backache, no apparent nausea or vomiting, spinal receding and patient able to bend at knees Anesthetic complications: no   No notable events documented.  Last Vitals:  Vitals:   11/25/23 1630 11/25/23 1634  BP: (!) 153/98 102/66  Pulse: 74 74  Resp: 16 20  Temp:    SpO2: 95% 93%    Last Pain:  Vitals:   11/25/23 1634  TempSrc:   PainSc: 3                  Khrystian Schauf A.

## 2023-11-25 NOTE — Transfer of Care (Signed)
 Immediate Anesthesia Transfer of Care Note  Patient: [name]  Procedure(s) Performed: ARTHROPLASTY, KNEE, TOTAL, USING IMAGELESS COMPUTER-ASSISTED NAVIGATION (Left: Knee)  Patient Location: PACU  Anesthesia Type:General  Level of Consciousness: awake and alert   Airway & Oxygen Therapy: Patient Spontanous Breathing and Patient connected to face mask oxygen  Post-op Assessment: Report given to RN and Post -op Vital signs reviewed and stable  Post vital signs: Reviewed and stable  Last Vitals:  Vitals Value Taken Time  BP 111/76 11/25/23 1500  Temp    Pulse 77 11/25/23 1500  Resp 21 11/25/23 1500  SpO2 96 % 11/25/23 1500  Vitals shown include unfiled device data.  Last Pain:  Vitals:   11/25/23 1105  TempSrc:   PainSc: 0-No pain         Complications: No notable events documented.

## 2023-11-25 NOTE — Interval H&P Note (Signed)
 History and Physical Interval Note:  11/25/2023 10:54 AM  [name]  has presented today for surgery, with the diagnosis of Left Knee Osteoarthritis.  The various methods of treatment have been discussed with the patient and family. After consideration of risks, benefits and other options for treatment, the patient has consented to  Procedure(s) with comments: ARTHROPLASTY, KNEE, TOTAL, USING IMAGELESS COMPUTER-ASSISTED NAVIGATION (Left) - 150 min as a surgical intervention.  The patient's history has been reviewed, patient examined, no change in status, stable for surgery.  I have reviewed the patient's chart and labs.  Questions were answered to the patient's satisfaction.     Iline Oven Isahia Hollerbach

## 2023-11-26 ENCOUNTER — Encounter (HOSPITAL_COMMUNITY): Payer: Self-pay | Admitting: Orthopedic Surgery

## 2023-11-26 DIAGNOSIS — G4730 Sleep apnea, unspecified: Secondary | ICD-10-CM | POA: Diagnosis not present

## 2023-11-26 DIAGNOSIS — M1712 Unilateral primary osteoarthritis, left knee: Secondary | ICD-10-CM | POA: Diagnosis not present

## 2023-11-26 DIAGNOSIS — F17210 Nicotine dependence, cigarettes, uncomplicated: Secondary | ICD-10-CM | POA: Diagnosis not present

## 2023-11-26 DIAGNOSIS — E119 Type 2 diabetes mellitus without complications: Secondary | ICD-10-CM | POA: Diagnosis not present

## 2023-11-26 DIAGNOSIS — K219 Gastro-esophageal reflux disease without esophagitis: Secondary | ICD-10-CM | POA: Diagnosis not present

## 2023-11-26 DIAGNOSIS — K449 Diaphragmatic hernia without obstruction or gangrene: Secondary | ICD-10-CM | POA: Diagnosis not present

## 2023-11-26 DIAGNOSIS — I10 Essential (primary) hypertension: Secondary | ICD-10-CM | POA: Diagnosis not present

## 2023-11-26 DIAGNOSIS — Z79899 Other long term (current) drug therapy: Secondary | ICD-10-CM | POA: Diagnosis not present

## 2023-11-26 DIAGNOSIS — Z96652 Presence of left artificial knee joint: Secondary | ICD-10-CM | POA: Diagnosis not present

## 2023-11-26 DIAGNOSIS — Z7984 Long term (current) use of oral hypoglycemic drugs: Secondary | ICD-10-CM | POA: Diagnosis not present

## 2023-11-26 LAB — CBC
HCT: 32.4 % — ABNORMAL LOW (ref 39.0–52.0)
Hemoglobin: 10.8 g/dL — ABNORMAL LOW (ref 13.0–17.0)
MCH: 32 pg (ref 26.0–34.0)
MCHC: 33.3 g/dL (ref 30.0–36.0)
MCV: 96.1 fL (ref 80.0–100.0)
Platelets: 208 10*3/uL (ref 150–400)
RBC: 3.37 MIL/uL — ABNORMAL LOW (ref 4.22–5.81)
RDW: 13 % (ref 11.5–15.5)
WBC: 13.9 10*3/uL — ABNORMAL HIGH (ref 4.0–10.5)
nRBC: 0 % (ref 0.0–0.2)

## 2023-11-26 LAB — BASIC METABOLIC PANEL
Anion gap: 11 (ref 5–15)
BUN: 22 mg/dL (ref 8–23)
CO2: 22 mmol/L (ref 22–32)
Calcium: 8.6 mg/dL — ABNORMAL LOW (ref 8.9–10.3)
Chloride: 102 mmol/L (ref 98–111)
Creatinine, Ser: 0.86 mg/dL (ref 0.61–1.24)
GFR, Estimated: >60 mL/min (ref >60)
Glucose, Bld: 131 mg/dL — ABNORMAL HIGH (ref 70–99)
Potassium: 3.7 mmol/L (ref 3.5–5.1)
Sodium: 135 mmol/L (ref 135–145)

## 2023-11-26 LAB — GLUCOSE, CAPILLARY
Glucose-Capillary: 109 mg/dL — ABNORMAL HIGH (ref 70–99)
Glucose-Capillary: 233 mg/dL — ABNORMAL HIGH (ref 70–99)

## 2023-11-26 MED ORDER — DOCUSATE SODIUM 100 MG PO CAPS: 100.0000 mg | ORAL_CAPSULE | Freq: Two times a day (BID) | ORAL | 1 refills | Status: AC

## 2023-11-26 MED ORDER — ONDANSETRON HCL 4 MG PO TABS: 4.0000 mg | ORAL_TABLET | Freq: Four times a day (QID) | ORAL | 0 refills | Status: DC | PRN

## 2023-11-26 MED ORDER — OXYCODONE HCL 5 MG PO TABS: 5.0000 mg | ORAL_TABLET | ORAL | 0 refills | Status: DC | PRN

## 2023-11-26 MED ORDER — METHOCARBAMOL 500 MG PO TABS: 500.0000 mg | ORAL_TABLET | Freq: Four times a day (QID) | ORAL | 0 refills | Status: DC | PRN

## 2023-11-26 MED ORDER — SENNA 8.6 MG PO TABS: 2.0000 | ORAL_TABLET | Freq: Every day | ORAL | 1 refills | Status: AC

## 2023-11-26 MED ORDER — IBUPROFEN 600 MG PO TABS: 600.0000 mg | ORAL_TABLET | Freq: Four times a day (QID) | ORAL | 3 refills | Status: AC | PRN

## 2023-11-26 MED ORDER — ASPIRIN 81 MG PO CHEW: 81.0000 mg | CHEWABLE_TABLET | Freq: Two times a day (BID) | ORAL | 0 refills | Status: AC

## 2023-11-26 MED ORDER — ACETAMINOPHEN 500 MG PO TABS: 1000.0000 mg | ORAL_TABLET | Freq: Three times a day (TID) | ORAL | 0 refills | Status: AC

## 2023-11-26 MED ORDER — POLYETHYLENE GLYCOL 3350 17 G PO PACK: 17.0000 g | PACK | Freq: Every day | ORAL | 0 refills | Status: DC | PRN

## 2023-11-26 NOTE — Plan of Care (Signed)
   Problem: Education: Goal: Knowledge of General Education information will improve Description: Including pain rating scale, medication(s)/side effects and non-pharmacologic comfort measures Outcome: Progressing   Problem: Activity: Goal: Risk for activity intolerance will decrease Outcome: Progressing   Problem: Pain Managment: Goal: General experience of comfort will improve and/or be controlled Outcome: Progressing

## 2023-11-26 NOTE — Progress Notes (Signed)
    Subjective:  Patient reports pain as mild to moderate.  Denies N/V/CP/SOB. C/o L knee pain.  Objective:   VITALS:   Vitals:   11/25/23 2202 11/26/23 0020 11/26/23 0432 11/26/23 0916  BP:  104/77 100/65 114/72  Pulse:  82 78   Resp:  19 16 17   Temp:  98.7 F (37.1 C) 97.7 F (36.5 C) 98.1 F (36.7 C)  TempSrc:      SpO2:  96% 98% 97%  Weight: 128.7 kg     Height: 5\' 11"  (1.803 m)       NAD ABD soft Sensation intact distally Intact pulses distally Dorsiflexion/Plantar flexion intact Incision: dressing C/D/I Compartment soft   Lab Results  Component Value Date   WBC 13.9 (H) 11/26/2023   HGB 10.8 (L) 11/26/2023   HCT 32.4 (L) 11/26/2023   MCV 96.1 11/26/2023   PLT 208 11/26/2023   BMET    Component Value Date/Time   NA 135 11/26/2023 0337   K 3.7 11/26/2023 0337   CL 102 11/26/2023 0337   CO2 22 11/26/2023 0337   GLUCOSE 131 (H) 11/26/2023 0337   GLUCOSE 93 08/26/2006 1051   BUN 22 11/26/2023 0337   CREATININE 0.86 11/26/2023 0337   CREATININE 0.75 05/26/2023 0935   CALCIUM 8.6 (L) 11/26/2023 0337   EGFR 96 05/26/2023 0935   GFRNONAA >60 11/26/2023 0337   GFRNONAA 78 05/08/2020 0837     Assessment/Plan: 1 Day Post-Op   Principal Problem:   Degenerative arthritis of left knee Active Problems:   Osteoarthritis of left knee   WBAT with walker DVT ppx: Aspirin, SCDs, TEDS PO pain control PT/OT DM2: FSBS, SSI ABLA: asymptomatic, monitor Dispo: D/C home after clears PT, OPPT set up   Jonette Pesa 11/26/2023, 10:49 AM   Samson Frederic, MD [phone] [facility] is now [facility]  [place] [address], [place], [place] [address] Phone: [phone] [email or web address] Facebook  [employer]

## 2023-11-26 NOTE — Progress Notes (Signed)
   11/26/23 1127  TOC Brief Assessment  Insurance and Status Reviewed  Patient has primary care physician Yes  Home environment has been reviewed home  Prior level of function: independent  Prior/Current Home Services No current home services  Social Drivers of Health Review SDOH reviewed no interventions necessary  Readmission risk has been reviewed Yes  Transition of care needs no transition of care needs at this time

## 2023-11-26 NOTE — Evaluation (Signed)
 Physical Therapy Evaluation Patient Details Name: [name] MRN: [id number] DOB: [birth year] Today's Date: 11/26/2023  History of Present Illness  73 yo male presents to therapy s/p L TKA on 11/25/2023. Pt PMH includes but is not limited to: HLD, DM II, spondylolisthesis, OSA, GERD, HTN, prosthetic R eye, tobacco abuse, lumbar decompression and fustion at L3-4, and R TKA  Clinical Impression  Pt is s/p L TKA resulting in the deficits listed below (see PT Problem List).  Pt will benefit from acute skilled PT to increase their independence and safety with mobility to allow discharge.  Pt ambulated in hallway 60 ft with RW.  Pt awaiting breakfast tray which arrived while ambulating, so pt left in recliner to eat end of session.  Pt plans to d/c home with daughter, no steps to enter, f/u with OPPT.         If plan is discharge home, recommend the following:     Can travel by private vehicle        Equipment Recommendations Rolling walker (2 wheels) (new RW in hospital room)  Recommendations for Other Services       Functional Status Assessment Patient has had a recent decline in their functional status and demonstrates the ability to make significant improvements in function in a reasonable and predictable amount of time.     Precautions / Restrictions Precautions Precautions: Fall;Knee Restrictions Weight Bearing Restrictions Per Provider Order: No Other Position/Activity Restrictions: WBAT      Mobility  Bed Mobility Overal bed mobility: Needs Assistance Bed Mobility: Supine to Sit     Supine to sit: Contact guard     General bed mobility comments: verbal cues for technique    Transfers Overall transfer level: Needs assistance Equipment used: Rolling walker (2 wheels) Transfers: Sit to/from Stand Sit to Stand: Min assist           General transfer comment: light assist to rise and steady; cues for UE and LE positioning for pain control     Ambulation/Gait Ambulation/Gait assistance: Contact guard assist Gait Distance (Feet): 60 Feet Assistive device: Rolling walker (2 wheels) Gait Pattern/deviations: Step-to pattern, Decreased stance time - left, Antalgic Gait velocity: decr     General Gait Details: verbal cues for sequence, RW positioning, step length, posture; distance to tolerance  Stairs            Wheelchair Mobility     Tilt Bed    Modified Rankin (Stroke Patients Only)       Balance                                             Pertinent Vitals/Pain Pain Assessment Pain Assessment: 0-10 Pain Score: 3  Pain Location: left knee Pain Descriptors / Indicators: Sore, Guarding, Grimacing Pain Intervention(s): Repositioned, Monitored during session, Premedicated before session    Home Living Family/patient expects to be discharged to:: Private residence Living Arrangements: Alone Available Help at Discharge: Family (daughter)               Additional Comments: new RW in pt's hospital room, son reports pt is discharging home with his daughter, she has no steps to enter and he will stay on main level; pt has 5 steps with 2 rails (not both reachable) and lives alone    Prior Function Prior Level of Function : Independent/Modified Independent  Extremity/Trunk Assessment        Lower Extremity Assessment Lower Extremity Assessment: LLE deficits/detail LLE Deficits / Details: unable to perform SLR, lacking at least 10* extension       Communication   Communication Communication: No apparent difficulties    Cognition Arousal: Alert Behavior During Therapy: WFL for tasks assessed/performed   PT - Cognitive impairments: No apparent impairments                         Following commands: Intact       Cueing       General Comments      Exercises     Assessment/Plan    PT Assessment Patient needs continued PT  services  PT Problem List Decreased range of motion;Decreased strength;Pain;Decreased balance;Decreased mobility;Decreased activity tolerance;Decreased knowledge of use of DME       PT Treatment Interventions DME instruction;Gait training;Balance training;Stair training;Functional mobility training;Therapeutic activities;Therapeutic exercise;Patient/family education    PT Goals (Current goals can be found in the Care Plan section)  Acute Rehab PT Goals PT Goal Formulation: With patient Time For Goal Achievement: 12/03/23 Potential to Achieve Goals: Good    Frequency 7X/week     Co-evaluation               AM-PAC PT "6 Clicks" Mobility  Outcome Measure Help needed turning from your back to your side while in a flat bed without using bedrails?: A Little Help needed moving from lying on your back to sitting on the side of a flat bed without using bedrails?: A Little Help needed moving to and from a bed to a chair (including a wheelchair)?: A Little Help needed standing up from a chair using your arms (e.g., wheelchair or bedside chair)?: A Little Help needed to walk in hospital room?: A Little Help needed climbing 3-5 steps with a railing? : A Lot 6 Click Score: 17    End of Session Equipment Utilized During Treatment: Gait belt Activity Tolerance: Patient tolerated treatment well Patient left: with call bell/phone within reach;in chair;with chair alarm set Nurse Communication: Mobility status PT Visit Diagnosis: Difficulty in walking, not elsewhere classified (R26.2);Pain Pain - Right/Left: Left Pain - part of body: Knee    Time: 4098-1191 PT Time Calculation (min) (ACUTE ONLY): 19 min   Charges:   PT Evaluation $PT Eval Low Complexity: 1 Low   PT General Charges $$ ACUTE PT VISIT: 1 Visit    Thomasene Mohair PT, DPT Physical Therapist Acute Rehabilitation Services Office: [phone]   Janan Halter [place] 11/26/2023, 11:46 AM

## 2023-11-26 NOTE — Progress Notes (Signed)
 Physical Therapy Treatment Patient Details Name: [name] MRN: [id number] DOB: 1950-12-30 Today's Date: 11/26/2023   History of Present Illness 73 yo male presents to therapy s/p L TKA on 11/25/2023. Pt PMH includes but is not limited to: HLD, DM II, spondylolisthesis, OSA, GERD, HTN, prosthetic R eye, tobacco abuse, lumbar decompression and fustion at L3-4, and R TKA    PT Comments  POD # 1 pm session PT - Cognition Comments: AxO x 3 pleasant and motivated.  Plans to go to his Daughters house post discharge. Assisted with amb in hallway then practiced stairs.  General Gait Details: <25% VC's on proper walker to self distance and safety with turns.  Tolerated an increased distance of 115 feet. General stair comments: 50% VC's on proper sequencing and safety up/down 2 steps using B rails.  Daughters house has NO steps to enter and Pt has six.  Staying with Daughter at first. Then returned to room to perform some TE's following HEP handout.  Instructed on proper tech, freq as well as use of ICE.   Addressed all mobility questions, discussed appropriate activity, educated on use of ICE.  Pt ready for D/C to home.    If plan is discharge home, recommend the following: A little help with walking and/or transfers;Assist for transportation;Assistance with cooking/housework   Can travel by Media [employer] walker (2 wheels)    Recommendations for Other Services       Precautions / Restrictions Precautions Precaution/Restrictions Comments: no pillow under knee Restrictions Weight Bearing Restrictions Per Provider Order: No Other Position/Activity Restrictions: WBAT     Mobility  Bed Mobility               General bed mobility comments: OOB in recliner    Transfers Overall transfer level: Needs assistance Equipment used: Rolling walker (2 wheels) Transfers: Sit to/from Stand Sit to Stand: Supervision, Contact guard assist            General transfer comment: VC's on proper hand placement to push up vs pull on walker    Ambulation/Gait Ambulation/Gait assistance: Supervision, Contact guard assist Gait Distance (Feet): 115 Feet Assistive device: Rolling walker (2 wheels) Gait Pattern/deviations: Step-to pattern, Decreased stance time - left, Antalgic Gait velocity: decreased     General Gait Details: <25% VC's on proper walker to self distance and safety with turns.  Tolerated an increased distance of 115 feet.   Stairs Stairs: Yes Stairs assistance: Supervision, Contact guard assist Stair Management: Two rails, Step to pattern, Forwards Number of Stairs: 2 General stair comments: 50% VC's on proper sequencing and safety up/down 2 steps using B rails.  Daughters house has NO steps to enter and Pt has six.  Staying with Daughter at first.   Wheelchair Mobility     Tilt Bed    Modified Rankin (Stroke Patients Only)       Balance                                            Communication    Cognition Arousal: Alert Behavior During Therapy: WFL for tasks assessed/performed   PT - Cognitive impairments: No apparent impairments                       PT - Cognition Comments: AxO x 3  pleasant and motivated.  Plans to go to his Daughters house post discharge. Following commands: Intact      Cueing    Exercises  Total Knee Replacement TE's following HEP handout 10 reps B LE ankle pumps 05 reps towel squeezes 05 reps knee presses 05 reps heel slides  05 reps SAQ's 05 reps SLR's 05 reps ABD Educated on use of gait belt to assist with TE's Followed by ICE     General Comments        Pertinent Vitals/Pain Pain Assessment Pain Assessment: 0-10 Pain Score: 5  Pain Location: left knee Pain Descriptors / Indicators: Sore, Guarding, Grimacing Pain Intervention(s): Premedicated before session, Repositioned, Ice applied, Monitored during session    Home Living                           Prior Function            PT Goals (current goals can now be found in the care plan section) Progress towards PT goals: Progressing toward goals    Frequency    7X/week      PT Plan      Co-evaluation              AM-PAC PT "6 Clicks" Mobility   Outcome Measure  Help needed turning from your back to your side while in a flat bed without using bedrails?: A Little Help needed moving from lying on your back to sitting on the side of a flat bed without using bedrails?: A Little Help needed moving to and from a bed to a chair (including a wheelchair)?: A Little Help needed standing up from a chair using your arms (e.g., wheelchair or bedside chair)?: A Little Help needed to walk in hospital room?: A Little Help needed climbing 3-5 steps with a railing? : A Little 6 Click Score: 18    End of Session Equipment Utilized During Treatment: Gait belt Activity Tolerance: Patient tolerated treatment well Patient left: with call bell/phone within reach;in chair;with chair alarm set Nurse Communication: Mobility status PT Visit Diagnosis: Difficulty in walking, not elsewhere classified (R26.2);Pain Pain - Right/Left: Left Pain - part of body: Knee     Time: 1347-1416 PT Time Calculation (min) (ACUTE ONLY): 29 min  Charges:    $Gait Training: 8-22 mins $Therapeutic Exercise: 8-22 mins PT General Charges $$ ACUTE PT VISIT: 1 Visit                     Felecia Shelling  PTA Acute  Rehabilitation Services Office M-F          [phone]

## 2023-11-28 ENCOUNTER — Emergency Department (HOSPITAL_COMMUNITY)
Admission: EM | Admit: 2023-11-28 | Discharge: 2023-11-29 | Disposition: A | Discharge status: Home / Self Care | Attending: Emergency Medicine | Admitting: Emergency Medicine

## 2023-11-28 DIAGNOSIS — Z96652 Presence of left artificial knee joint: Secondary | ICD-10-CM | POA: Insufficient documentation

## 2023-11-28 DIAGNOSIS — Y831 Surgical operation with implant of artificial internal device as the cause of abnormal reaction of the patient, or of later complication, without mention of misadventure at the time of the procedure: Secondary | ICD-10-CM | POA: Diagnosis not present

## 2023-11-28 DIAGNOSIS — T8130XA Disruption of wound, unspecified, initial encounter: Secondary | ICD-10-CM

## 2023-11-28 DIAGNOSIS — T8131XA Disruption of external operation (surgical) wound, not elsewhere classified, initial encounter: Secondary | ICD-10-CM | POA: Diagnosis not present

## 2023-11-28 NOTE — ED Triage Notes (Signed)
 Pt states he had a knee replacement on Wednesday and started having bleeding from incision site approx 2 hours ago. Family states his bandages were saturated with blood.  Bleeding controlled at this time.

## 2023-11-29 ENCOUNTER — Other Ambulatory Visit: Payer: Self-pay

## 2023-11-29 NOTE — ED Provider Notes (Signed)
 [place] [facility] DE[facility]MENT AT [facility] Provider Note   CSN: [id number] Arrival date & time: 11/28/23  2350     History  Chief Complaint  Patient presents with   Post-op Problem    [name] is a 73 y.o. male.  The history is provided by the patient and a relative.  Patient underwent left total knee arthroplasty on March 5 without any complications.  He has been having very little pain and has been active since the surgery.  He takes aspirin but no other anticoagulation.  Tonight his family noticed there was bleeding from the wound.  He was supposed to leave the bandage on for 2 weeks, but it started to soak through the bandage.  No other acute complaints.  No fevers or vomiting.  No chest pain or shortness of breath   No falls or trauma  Home Medications Prior to Admission medications   Medication Sig Start Date End Date Taking? Authorizing Provider  acetaminophen (TYLENOL) 500 MG tablet Take 2 tablets (1,000 mg total) by mouth every 8 (eight) hours. 11/26/23   Swinteck, Arlys John, MD  allopurinol (ZYLOPRIM) 300 MG tablet TAKE 1 TABLET BY MOUTH EVERY DAY 01/12/23   Donita Brooks, MD  amLODipine (NORVASC) 10 MG tablet Take 1 tablet (10 mg total) by mouth daily. 08/06/23   Donita Brooks, MD  aspirin 81 MG chewable tablet Chew 1 tablet (81 mg total) by mouth 2 (two) times daily. 11/26/23 01/10/24  Swinteck, Arlys John, MD  chlorhexidine (HIBICLENS) 4 % external liquid Apply 15 mLs (1 Application total) topically as directed for 30 doses. Use as directed daily for 5 days every other week for 6 weeks. 11/25/23   Swinteck, Arlys John, MD  Difluprednate 0.05 % EMUL Place 1 drop into the left eye 2 (two) times daily. 11/21/22   [provider]  docusate sodium (COLACE) 100 MG capsule Take 1 capsule (100 mg total) by mouth 2 (two) times daily. 11/26/23   Swinteck, Arlys John, MD  dorzolamide-timolol (COSOPT) 2-0.5 % ophthalmic solution Place 1 drop into the left eye 2 (two) times  daily.    [provider]  fenofibrate 160 MG tablet TAKE 1 TABLET BY MOUTH EVERY DAY 06/18/21   Donita Brooks, MD  ibuprofen (ADVIL) 600 MG tablet Take 1 tablet (600 mg total) by mouth every 6 (six) hours as needed. 11/26/23   Swinteck, Arlys John, MD  latanoprost (XALATAN) 0.005 % ophthalmic solution Place 1 drop into the left eye at bedtime.  09/30/17   [provider]  lisinopril-hydrochlorothiazide (ZESTORETIC) 20-25 MG tablet TAKE 1 TABLET BY MOUTH EVERY DAY 11/02/23   Donita Brooks, MD  metFORMIN (GLUCOPHAGE) 500 MG tablet TAKE 1 TABLET BY MOUTH TWICE A DAY WITH FOOD 11/02/23   Donita Brooks, MD  methocarbamol (ROBAXIN) 500 MG tablet Take 1 tablet (500 mg total) by mouth every 6 (six) hours as needed for muscle spasms. 11/26/23   Swinteck, Arlys John, MD  mupirocin ointment (BACTROBAN) 2 % Place 1 Application into the nose 2 (two) times daily for 60 doses. Use as directed 2 times daily for 5 days every other week for 6 weeks. 11/25/23 12/25/23  Swinteck, Arlys John, MD  omeprazole (PRILOSEC) 20 MG capsule Take 20 mg by mouth every morning.     [provider]  ondansetron (ZOFRAN) 4 MG tablet Take 1 tablet (4 mg total) by mouth every 6 (six) hours as needed for nausea. 11/26/23   Swinteck, Arlys John, MD  oxyCODONE (OXY IR/ROXICODONE)  5 MG immediate release tablet Take 1 tablet (5 mg total) by mouth every 4 (four) hours as needed for moderate pain (pain score 4-6) (pain score 4-6). 11/26/23   Swinteck, Arlys John, MD  polyethylene glycol (MIRALAX / GLYCOLAX) 17 g packet Take 17 g by mouth daily as needed for mild constipation. 11/26/23   Swinteck, Arlys John, MD  pravastatin (PRAVACHOL) 40 MG tablet TAKE 1 TABLET BY MOUTH EVERY DAY 09/22/23   Donita Brooks, MD  prednisoLONE acetate (PRED FORTE) 1 % ophthalmic suspension Place 1 drop into the left eye 4 (four) times daily. 12/29/22   [provider]  senna (SENOKOT) 8.6 MG TABS tablet Take 2 tablets (17.2 mg total) by mouth at bedtime. 11/26/23  12/26/23  Samson Frederic, MD      Allergies    Patient has no known allergies.    Review of Systems   Review of Systems  Physical Exam Updated Vital Signs BP 108/67   Pulse 83   Temp 97.9 F (36.6 C) (Oral)   Resp 16   SpO2 97%  Physical Exam CONSTITUTIONAL: Well developed/well nourished, no distress HEAD: Normocephalic/atraumatic NEURO: Pt is awake/alert/appropriate, moves all extremitiesx4.  No facial droop.   EXTREMITIES: pulses normal/equal, full ROM Distal pulses equal intact in both feet. The left knee is enlarged in the postoperative state but no overlying erythema.  The lower edge of the incision is leaking small amount of blood.  No tenderness or crepitus.  See photo SKIN: warm, see photo PSYCH: no abnormalities of mood noted, alert and oriented to situation  Patient gave verbal permission to utilize photo for medical documentation only The image was not stored on any personal device  ED Results / Procedures / Treatments   Labs (all labs ordered are listed, but only abnormal results are displayed) Labs Reviewed - No data to display  EKG None  Radiology No results found.  Procedures Wound packing  Date/Time: 11/29/2023 1:39 AM  Performed by: Zadie Rhine, MD Authorized by: Zadie Rhine, MD  Consent: Verbal consent obtained. Patient identity confirmed: provided demographic data Patient tolerance: patient tolerated the procedure well with no immediate complications Comments: Lower edge of the incision was leaking blood.  I initially attempted to utilize Dermabond without success.  I then placed a quick clot bandage on it and wrapped it with Kerlix for around 20 minutes.  At this point, hemostasis was achieved.  I then placed a postoperative bandage similar to the one he had on over the wound, and then wrapped it with Ace wrap.  Patient tolerated well, he is neurovascularly intact       Medications Ordered in ED Medications - No data to display  ED  Course/ Medical Decision Making/ A&P                                 Medical Decision Making  Safe for discharge.  Discussed need to call his orthopedist in around 24 hours        Final Clinical Impression(s) / ED Diagnoses Final diagnoses:  Wound dehiscence    Rx / DC Orders ED Discharge Orders     None         Zadie Rhine, MD 11/29/23 0140

## 2023-11-29 NOTE — ED Notes (Signed)
 Incision dressed with Mepilex bandage

## 2023-11-29 NOTE — Discharge Instructions (Signed)
 Keep leg elevated when you are not walking.  Limits her overall activity to ensure the wound does not open up anymore.  Call your orthopedic doctor on Monday morning Return for any worsening bleeding or new pain in the next 48 hours

## 2023-11-30 DIAGNOSIS — M25562 Pain in left knee: Secondary | ICD-10-CM | POA: Diagnosis not present

## 2023-11-30 NOTE — ED Provider Notes (Signed)
 Addendum: Phone call with daughter toya Informed that bandage should be changed within the next day to remove quick-clot She reports her father has therapy today and will have bandage changed He is otherwise doing well.    Zadie Rhine, MD 11/30/23 [phone]

## 2023-12-02 DIAGNOSIS — M25562 Pain in left knee: Secondary | ICD-10-CM | POA: Diagnosis not present

## 2023-12-04 DIAGNOSIS — M25562 Pain in left knee: Secondary | ICD-10-CM | POA: Diagnosis not present

## 2023-12-07 DIAGNOSIS — Z96652 Presence of left artificial knee joint: Secondary | ICD-10-CM | POA: Diagnosis not present

## 2023-12-07 DIAGNOSIS — Z471 Aftercare following joint replacement surgery: Secondary | ICD-10-CM | POA: Diagnosis not present

## 2023-12-09 DIAGNOSIS — M25562 Pain in left knee: Secondary | ICD-10-CM | POA: Diagnosis not present

## 2023-12-11 DIAGNOSIS — M25562 Pain in left knee: Secondary | ICD-10-CM | POA: Diagnosis not present

## 2023-12-15 DIAGNOSIS — M25562 Pain in left knee: Secondary | ICD-10-CM | POA: Diagnosis not present

## 2023-12-18 DIAGNOSIS — M25562 Pain in left knee: Secondary | ICD-10-CM | POA: Diagnosis not present

## 2023-12-22 DIAGNOSIS — M25562 Pain in left knee: Secondary | ICD-10-CM | POA: Diagnosis not present

## 2023-12-25 DIAGNOSIS — M25562 Pain in left knee: Secondary | ICD-10-CM | POA: Diagnosis not present

## 2023-12-29 DIAGNOSIS — M25562 Pain in left knee: Secondary | ICD-10-CM | POA: Diagnosis not present

## 2024-01-05 DIAGNOSIS — Z970 Presence of artificial eye: Secondary | ICD-10-CM | POA: Diagnosis not present

## 2024-01-05 DIAGNOSIS — H31092 Other chorioretinal scars, left eye: Secondary | ICD-10-CM | POA: Diagnosis not present

## 2024-01-05 LAB — HM DIABETES EYE EXAM

## 2024-01-19 DIAGNOSIS — Z471 Aftercare following joint replacement surgery: Secondary | ICD-10-CM | POA: Diagnosis not present

## 2024-01-19 DIAGNOSIS — Z96652 Presence of left artificial knee joint: Secondary | ICD-10-CM | POA: Diagnosis not present

## 2024-01-29 ENCOUNTER — Other Ambulatory Visit: Payer: Self-pay | Admitting: Family Medicine

## 2024-01-29 DIAGNOSIS — I10 Essential (primary) hypertension: Secondary | ICD-10-CM

## 2024-03-15 ENCOUNTER — Telehealth: Payer: Self-pay

## 2024-03-15 NOTE — Telephone Encounter (Signed)
 Prescription Request  03/15/2024  LOV: 09/17/23  What is the name of the medication or equipment? pravastatin  (PRAVACHOL ) 40 MG tablet [[id number]]   Have you contacted your pharmacy to request a refill? Yes   Which pharmacy would you like this sent to?  [id number] [place], [place] - [address] [place] [address] Phone: [phone] Fax: [phone]    Patient notified that their request is being sent to the clinical staff for review and that they should receive a response within 2 business days.   Please advise at [facility] [phone]

## 2024-03-16 ENCOUNTER — Other Ambulatory Visit: Payer: Self-pay

## 2024-03-16 DIAGNOSIS — E785 Hyperlipidemia, unspecified: Secondary | ICD-10-CM

## 2024-03-16 MED ORDER — PRAVASTATIN SODIUM 40 MG PO TABS: 40.0000 mg | ORAL_TABLET | Freq: Every day | ORAL | 1 refills | Status: DC

## 2024-05-25 DIAGNOSIS — H2012 Chronic iridocyclitis, left eye: Secondary | ICD-10-CM | POA: Diagnosis not present

## 2024-05-25 DIAGNOSIS — H401123 Primary open-angle glaucoma, left eye, severe stage: Secondary | ICD-10-CM | POA: Diagnosis not present

## 2024-07-29 ENCOUNTER — Telehealth: Payer: Self-pay

## 2024-07-29 ENCOUNTER — Other Ambulatory Visit: Payer: Self-pay

## 2024-07-29 DIAGNOSIS — E118 Type 2 diabetes mellitus with unspecified complications: Secondary | ICD-10-CM

## 2024-07-29 DIAGNOSIS — I10 Essential (primary) hypertension: Secondary | ICD-10-CM

## 2024-07-29 MED ORDER — AMLODIPINE BESYLATE 10 MG PO TABS: 10.0000 mg | ORAL_TABLET | Freq: Every day | ORAL | 0 refills | Status: DC

## 2024-07-29 MED ORDER — LISINOPRIL-HYDROCHLOROTHIAZIDE 20-25 MG PO TABS: 1.0000 | ORAL_TABLET | Freq: Every day | ORAL | 0 refills | Status: DC

## 2024-07-29 MED ORDER — METFORMIN HCL 500 MG PO TABS: 500.0000 mg | ORAL_TABLET | Freq: Two times a day (BID) | ORAL | 0 refills | Status: AC

## 2024-07-29 NOTE — Telephone Encounter (Signed)
 Prescription Request  07/29/2024  LOV: 05/26/23  What is the name of the medication or equipment? amLODipine  (NORVASC ) 10 MG tablet [[id number]]   Have you contacted your pharmacy to request a refill? Yes   Which pharmacy would you like this sent to?  [id number] [address], [place] - [address] [place] [address] Phone: [phone] Fax: [phone]    Patient notified that their request is being sent to the clinical staff for review and that they should receive a response within 2 business days.   Please advise at [facility] [phone]  Prescription Request  07/29/2024  LOV: 05/26/23  What is the name of the medication or equipment? metFORMIN  (GLUCOPHAGE ) 500 MG tablet [[id number]]   Have you contacted your pharmacy to request a refill? Yes   Which pharmacy would you like this sent to?  [id number] [place], [place] - [address] [place] [address] Phone: [phone] Fax: [phone]    Patient notified that their request is being sent to the clinical staff for review and that they should receive a response within 2 business days.   Please advise at [facility] [phone]  Prescription Request  07/29/2024  LOV: 05/26/23  What is the name of the medication or equipment? lisinopril -hydrochlorothiazide  (ZESTORETIC ) 20-25 MG tablet [[id number]]   Have you contacted your pharmacy to request a refill? Yes   Which pharmacy would you like this sent to?  [id number] [address], [place] - [address] [place] [address] Phone: [phone] Fax: [phone]    Patient notified that their request is being sent to the clinical staff for review and that they should receive a response within 2 business days.   Please advise at [facility] [phone]

## 2024-08-02 ENCOUNTER — Encounter: Payer: Self-pay | Admitting: Family Medicine

## 2024-08-02 ENCOUNTER — Ambulatory Visit (INDEPENDENT_AMBULATORY_CARE_PROVIDER_SITE_OTHER): Admitting: Family Medicine

## 2024-08-02 VITALS — BP 126/68 | HR 76 | Temp 98.7°F | Ht 71.0 in | Wt 266.8 lb

## 2024-08-02 DIAGNOSIS — Z23 Encounter for immunization: Secondary | ICD-10-CM | POA: Diagnosis not present

## 2024-08-02 DIAGNOSIS — E118 Type 2 diabetes mellitus with unspecified complications: Secondary | ICD-10-CM

## 2024-08-02 DIAGNOSIS — I10 Essential (primary) hypertension: Secondary | ICD-10-CM | POA: Diagnosis not present

## 2024-08-02 DIAGNOSIS — Z7984 Long term (current) use of oral hypoglycemic drugs: Secondary | ICD-10-CM | POA: Diagnosis not present

## 2024-08-02 DIAGNOSIS — E785 Hyperlipidemia, unspecified: Secondary | ICD-10-CM

## 2024-08-02 DIAGNOSIS — E1169 Type 2 diabetes mellitus with other specified complication: Secondary | ICD-10-CM

## 2024-08-02 DIAGNOSIS — Z125 Encounter for screening for malignant neoplasm of prostate: Secondary | ICD-10-CM

## 2024-08-02 NOTE — Progress Notes (Signed)
 Subjective:    Patient ID: [name], male    DOB: 04/19/1951, 73 y.o.   MRN: [id number]  Patient is a very pleasant 73 year old gentleman history of type II diabetes mellitus, hypertriglyceridemia, dyslipidemia, and hypertension.  Blood pressure today is well-controlled.  He denies any neuropathy in his feet.  He denies any nausea or vomiting.  He denies any diarrhea on metformin .  He denies any chest pain or shortness of breath or dyspnea on exertion.  He is due for a flu shot as well as the shingles vaccine.  Diabetic foot exam today is normal. Past Medical History:  Diagnosis Date   Arthritis    Barrett esophagus    Bladder neck obstruction    Cataract    removed in left eye   Colon polyp    Diabetes mellitus without complication (HCC)    Esophageal stricture    GERD (gastroesophageal reflux disease)    Glaucoma    History of hiatal hernia    Hyperlipidemia    Hypertension    Peyronie disease    Prosthetic eye globe    Right eye   Sleep apnea    does not use machine per pt.   Smoker    Wears glasses    Past Surgical History:  Procedure Laterality Date   BACK SURGERY     lumbar decompression and fusion (Dr. Louis) 2019   COLONOSCOPY     DP   ELBOW SURGERY Right    fractured radius surgery     KNEE ARTHROPLASTY Left 11/25/2023   Procedure: ARTHROPLASTY, KNEE, TOTAL, USING IMAGELESS COMPUTER-ASSISTED NAVIGATION;  Surgeon: Fidel Rogue, MD;  Location: [facility];  Service: Orthopedics;  Laterality: Left;  150 min   KNEE ARTHROSCOPY N/A    NASAL SINUS SURGERY     after injury has a metal plate   NESBIT PROCEDURE N/A 12/21/2019   Procedure: NESBIT PROCEDURE PENILE PLICATION AND PENILE BLOCK;  Surgeon: Alvaro Hummer, MD;  Location: [facility];  Service: Urology;  Laterality: N/A;  1 HR   POLYPECTOMY     TONSILLECTOMY     removed as child unsure of age   TOTAL KNEE ARTHROPLASTY Right    traumatic eye enucleation  2011   right artifical eye   Current  Outpatient Medications on File Prior to Visit  Medication Sig Dispense Refill   acetaminophen  (TYLENOL ) 500 MG tablet Take 2 tablets (1,000 mg total) by mouth every 8 (eight) hours. 180 tablet 0   allopurinol  (ZYLOPRIM ) 300 MG tablet TAKE 1 TABLET BY MOUTH EVERY DAY 90 tablet 2   amLODipine  (NORVASC ) 10 MG tablet Take 1 tablet (10 mg total) by mouth daily. 30 tablet 0   chlorhexidine  (HIBICLENS ) 4 % external liquid Apply 15 mLs (1 Application total) topically as directed for 30 doses. Use as directed daily for 5 days every other week for 6 weeks. 946 mL 1   Difluprednate 0.05 % EMUL Place 1 drop into the left eye 2 (two) times daily.     docusate sodium  (COLACE) 100 MG capsule Take 1 capsule (100 mg total) by mouth 2 (two) times daily. 60 capsule 1   dorzolamide -timolol  (COSOPT ) 2-0.5 % ophthalmic solution Place 1 drop into the left eye 2 (two) times daily.     fenofibrate  160 MG tablet TAKE 1 TABLET BY MOUTH EVERY DAY 90 tablet 1   ibuprofen  (ADVIL ) 600 MG tablet Take 1 tablet (600 mg total) by mouth every 6 (six) hours as needed. 30 tablet  3   latanoprost  (XALATAN ) 0.005 % ophthalmic solution Place 1 drop into the left eye at bedtime.   11   lisinopril -hydrochlorothiazide  (ZESTORETIC ) 20-25 MG tablet Take 1 tablet by mouth daily. 30 tablet 0   metFORMIN  (GLUCOPHAGE ) 500 MG tablet Take 1 tablet (500 mg total) by mouth 2 (two) times daily with a meal. 60 tablet 0   methocarbamol  (ROBAXIN ) 500 MG tablet Take 1 tablet (500 mg total) by mouth every 6 (six) hours as needed for muscle spasms. 30 tablet 0   omeprazole (PRILOSEC) 20 MG capsule Take 20 mg by mouth every morning.      ondansetron  (ZOFRAN ) 4 MG tablet Take 1 tablet (4 mg total) by mouth every 6 (six) hours as needed for nausea. 20 tablet 0   oxyCODONE  (OXY IR/ROXICODONE ) 5 MG immediate release tablet Take 1 tablet (5 mg total) by mouth every 4 (four) hours as needed for moderate pain (pain score 4-6) (pain score 4-6). 42 tablet 0    polyethylene glycol (MIRALAX  / GLYCOLAX ) 17 g packet Take 17 g by mouth daily as needed for mild constipation. 14 each 0   pravastatin  (PRAVACHOL ) 40 MG tablet Take 1 tablet (40 mg total) by mouth daily. 90 tablet 1   prednisoLONE  acetate (PRED FORTE ) 1 % ophthalmic suspension Place 1 drop into the left eye 4 (four) times daily.     Current Facility-Administered Medications on File Prior to Visit  Medication Dose Route Frequency Provider Last Rate Last Admin   vancomycin  (VANCOCIN ) 1,000 mg in sodium chloride  0.9 % 500 mL IVPB  1,000 mg Intravenous Once Hill, Avery S, PA-C       No Known Allergies Social History   Socioeconomic History   Marital status: Divorced    Spouse name: Not on file   Number of children: Not on file   Years of education: Not on file   Highest education level: 12th grade  Occupational History   Not on file  Tobacco Use   Smoking status: Some Days    Current packs/day: 0.25    Types: Cigarettes   Smokeless tobacco: Never   Tobacco comments:    quit Jan. 2021  Vaping Use   Vaping status: Never Used  Substance and Sexual Activity   Alcohol  use: Yes    Alcohol /week: 10.0 standard drinks of alcohol     Types: 10 Standard drinks or equivalent per week   Drug use: No   Sexual activity: Yes  Other Topics Concern   Not on file  Social History Narrative   Not on file   Social Drivers of Health   Financial Resource Strain: Low Risk  (08/01/2024)   Overall Financial Resource Strain (CARDIA)    Difficulty of Paying Living Expenses: Not hard at all  Food Insecurity: No Food Insecurity (08/01/2024)   Hunger Vital Sign    Worried About Running Out of Food in the Last Year: Never true    Ran Out of Food in the Last Year: Never true  Transportation Needs: No Transportation Needs (08/01/2024)   PRAPARE - Administrator, Civil Service (Medical): No    Lack of Transportation (Non-Medical): No  Physical Activity: Insufficiently Active (08/01/2024)    Exercise Vital Sign    Days of Exercise per Week: 2 days    Minutes of Exercise per Session: 10 min  Stress: No Stress Concern Present (08/01/2024)   [employer] of Occupational Health - Occupational Stress Questionnaire    Feeling of Stress: Not at all  Social Connections: Moderately Integrated (08/01/2024)   Social Connection and Isolation Panel    Frequency of Communication with Friends and Family: More than three times a week    Frequency of Social Gatherings with Friends and Family: More than three times a week    Attends Religious Services: More than 4 times per year    Active Member of [employer] or Organizations: Yes    Attends Banker Meetings: More than 4 times per year    Marital Status: Divorced  Intimate Partner Violence: Not At Risk (11/25/2023)   Humiliation, Afraid, Rape, and Kick questionnaire    Fear of Current or Ex-Partner: No    Emotionally Abused: No    Physically Abused: No    Sexually Abused: No      Review of Systems  All other systems reviewed and are negative.      Objective:   Physical Exam Vitals reviewed.  Constitutional:      Appearance: He is well-developed.  HENT:     Head: Normocephalic and atraumatic.     Right Ear: External ear normal.     Left Ear: External ear normal.     Nose: Nose normal.     Mouth/Throat:     Pharynx: No oropharyngeal exudate.  Eyes:     Conjunctiva/sclera: Conjunctivae normal.   Neck:     Thyroid: No thyromegaly.     Vascular: No JVD.     Trachea: No tracheal deviation.  Cardiovascular:     Rate and Rhythm: Normal rate and regular rhythm.     Heart sounds: Normal heart sounds. No murmur heard. Pulmonary:     Effort: Pulmonary effort is normal. No respiratory distress.     Breath sounds: Normal breath sounds. No stridor. No wheezing or rales.  Abdominal:     General: Bowel sounds are normal. There is no distension.     Palpations: Abdomen is soft. There is no mass.     Tenderness: There is  no abdominal tenderness. There is no guarding or rebound.  Musculoskeletal:     Cervical back: Neck supple.  Lymphadenopathy:     Cervical: No cervical adenopathy.  Neurological:     Mental Status: He is alert and oriented to person, place, and time.     Cranial Nerves: No cranial nerve deficit.     Motor: No abnormal muscle tone.     Coordination: Coordination normal.     Deep Tendon Reflexes: Reflexes are normal and symmetric.  Psychiatric:        Behavior: Behavior normal.        Thought Content: Thought content normal.        Judgment: Judgment normal.           Assessment & Plan:  Diabetes mellitus type 2 with complications (HCC) - Plan: CBC with Differential/Platelet, Comprehensive metabolic panel with GFR, Lipid panel, Hemoglobin A1c  Hyperlipidemia associated with type 2 diabetes mellitus (HCC)  Prostate cancer screening - Plan: PSA  Benign essential HTN I discussed switching the patient from metformin  to Mounjaro.  At the present time he declines this.  He received his flu shot and shingles shot today.  His blood pressure is well-controlled.  Screen for prostate cancer with PSA.  Check CBC CMP lipid panel and urine microalbumin to creatinine ratio if.  Goal LDL cholesterol is less than 100.  Goal A1c is less than 6.5.

## 2024-08-03 LAB — LIPID PANEL
Cholesterol: 186 mg/dL (ref <200)
HDL: 51 mg/dL (ref >=40)
LDL Cholesterol (Calc): 99 mg/dL
Non-HDL Cholesterol (Calc): 135 mg/dL — ABNORMAL HIGH (ref <130)
Total CHOL/HDL Ratio: 3.6 (calc) (ref <5.0)
Triglycerides: 252 mg/dL — ABNORMAL HIGH (ref <150)

## 2024-08-03 LAB — PSA: PSA: 4.25 ng/mL — ABNORMAL HIGH (ref <=4.00)

## 2024-08-03 LAB — COMPREHENSIVE METABOLIC PANEL WITH GFR
AG Ratio: 1.5 (calc) (ref 1.0–2.5)
ALT: 13 U/L (ref 9–46)
AST: 11 U/L (ref 10–35)
Albumin: 4.3 g/dL (ref 3.6–5.1)
Alkaline phosphatase (APISO): 39 U/L (ref 35–144)
BUN: 21 mg/dL (ref 7–25)
CO2: 26 mmol/L (ref 20–32)
Calcium: 9.2 mg/dL (ref 8.6–10.3)
Chloride: 103 mmol/L (ref 98–110)
Creat: 0.84 mg/dL (ref 0.70–1.28)
Globulin: 2.8 g/dL (ref 1.9–3.7)
Glucose, Bld: 103 mg/dL — ABNORMAL HIGH (ref 65–99)
Potassium: 3.8 mmol/L (ref 3.5–5.3)
Sodium: 141 mmol/L (ref 135–146)
Total Bilirubin: 0.6 mg/dL (ref 0.2–1.2)
Total Protein: 7.1 g/dL (ref 6.1–8.1)
eGFR: 93 mL/min/1.73m2 (ref >=60)

## 2024-08-03 LAB — MICROALBUMIN / CREATININE URINE RATIO
Creatinine, Urine: 135 mg/dL (ref 20–320)
Microalb Creat Ratio: 76 mg/g{creat} — ABNORMAL HIGH (ref <30)
Microalb, Ur: 10.3 mg/dL

## 2024-08-03 LAB — CBC WITH DIFFERENTIAL/PLATELET
Absolute Lymphocytes: 2052 {cells}/uL (ref 850–3900)
Absolute Monocytes: 612 {cells}/uL (ref 200–950)
Basophils Absolute: 48 {cells}/uL (ref 0–200)
Basophils Relative: 0.8 %
Eosinophils Absolute: 438 {cells}/uL (ref 15–500)
Eosinophils Relative: 7.3 %
HCT: 40.4 % (ref 38.5–50.0)
Hemoglobin: 13.7 g/dL (ref 13.2–17.1)
MCH: 30.9 pg (ref 27.0–33.0)
MCHC: 33.9 g/dL (ref 32.0–36.0)
MCV: 91 fL (ref 80.0–100.0)
MPV: 10.6 fL (ref 7.5–12.5)
Monocytes Relative: 10.2 %
Neutro Abs: 2850 {cells}/uL (ref 1500–7800)
Neutrophils Relative %: 47.5 %
Platelets: 240 Thousand/uL (ref 140–400)
RBC: 4.44 Million/uL (ref 4.20–5.80)
RDW: 14.4 % (ref 11.0–15.0)
Total Lymphocyte: 34.2 %
WBC: 6 Thousand/uL (ref 3.8–10.8)

## 2024-08-03 LAB — HEMOGLOBIN A1C
Hgb A1c MFr Bld: 6 % — ABNORMAL HIGH (ref <5.7)
Mean Plasma Glucose: 126 mg/dL
eAG (mmol/L): 7 mmol/L

## 2024-08-04 ENCOUNTER — Ambulatory Visit: Payer: Self-pay | Admitting: Family Medicine

## 2024-08-04 DIAGNOSIS — Z125 Encounter for screening for malignant neoplasm of prostate: Secondary | ICD-10-CM

## 2024-08-28 ENCOUNTER — Other Ambulatory Visit: Payer: Self-pay | Admitting: Family Medicine

## 2024-08-28 DIAGNOSIS — E785 Hyperlipidemia, unspecified: Secondary | ICD-10-CM

## 2024-09-20 ENCOUNTER — Telehealth: Payer: Self-pay

## 2024-09-20 ENCOUNTER — Other Ambulatory Visit: Payer: Self-pay

## 2024-09-20 DIAGNOSIS — I10 Essential (primary) hypertension: Secondary | ICD-10-CM

## 2024-09-20 MED ORDER — LISINOPRIL-HYDROCHLOROTHIAZIDE 20-25 MG PO TABS: 1.0000 | ORAL_TABLET | Freq: Every day | ORAL | 0 refills | Status: DC

## 2024-09-20 NOTE — Telephone Encounter (Signed)
 Sent in medication

## 2024-09-20 NOTE — Telephone Encounter (Signed)
 Prescription Request  09/20/2024  LOV: 08/02/24  What is the name of the medication or equipment? lisinopril -hydrochlorothiazide  (ZESTORETIC ) 20-25 MG tablet [[id number]]   Have you contacted your pharmacy to request a refill? Yes   Which pharmacy would you like this sent to?  [id number] [address], [place] - [address] [place] [address] Phone: [phone] Fax: [phone]    Patient notified that their request is being sent to the clinical staff for review and that they should receive a response within 2 business days.   Please advise at [facility] [phone]

## 2024-09-21 ENCOUNTER — Ambulatory Visit

## 2024-09-21 ENCOUNTER — Telehealth: Payer: Self-pay

## 2024-09-21 ENCOUNTER — Other Ambulatory Visit: Payer: Self-pay

## 2024-09-21 VITALS — Ht 71.0 in | Wt 266.0 lb

## 2024-09-21 DIAGNOSIS — I10 Essential (primary) hypertension: Secondary | ICD-10-CM

## 2024-09-21 DIAGNOSIS — Z0000 Encounter for general adult medical examination without abnormal findings: Secondary | ICD-10-CM

## 2024-09-21 MED ORDER — AMLODIPINE BESYLATE 10 MG PO TABS: 10.0000 mg | ORAL_TABLET | Freq: Every day | ORAL | 0 refills | Status: DC

## 2024-09-21 NOTE — Telephone Encounter (Signed)
 Prescription Request  09/21/2024  LOV: 08/02/24  What is the name of the medication or equipment? amLODipine  (NORVASC ) 10 MG tablet [[id number]]   Have you contacted your pharmacy to request a refill? Yes   Which pharmacy would you like this sent to?  [id number] [place], [place] - [address] [facility] [address] [place] [address] Phone: [phone] Fax: [phone]    Patient notified that their request is being sent to the clinical staff for review and that they should receive a response within 2 business days.   Please advise at [facility] [phone]

## 2024-09-21 NOTE — Patient Instructions (Signed)
 [name],  Thank you for taking the time for your Medicare Wellness Visit. I appreciate your continued commitment to your health goals. Please review the care plan we discussed, and feel free to reach out if I can assist you further.  Please note that Annual Wellness Visits do not include a physical exam. Some assessments may be limited, especially if the visit was conducted virtually. If needed, we may recommend an in-person follow-up with your provider.  Ongoing Care Seeing your primary care provider every 3 to 6 months helps us  monitor your health and provide consistent, personalized care.   Referrals If a referral was made during today's visit and you haven't received any updates within two weeks, please contact the referred provider directly to check on the status.  Recommended Screenings:  Health Maintenance  Topic Date Due   COVID-19 Vaccine (4 - 2025-26 season) 05/23/2024   Complete foot exam   12/31/2024*   DTaP/Tdap/Td vaccine (2 - Tdap) 08/02/2025*   Zoster (Shingles) Vaccine (2 of 2) 09/27/2024   Eye exam for diabetics  01/04/2025   Hemoglobin A1C  01/30/2025   Colon Cancer Screening  02/08/2025   Yearly kidney function blood test for diabetes  08/02/2025   Yearly kidney health urinalysis for diabetes  08/02/2025   Medicare Annual Wellness Visit  09/21/2025   Pneumococcal Vaccine for age over 73  Completed   Flu Shot  Completed   Hepatitis C Screening  Completed   Meningitis B Vaccine  Aged Out  *Topic was postponed. The date shown is not the original due date.       09/21/2024   10:52 AM  Advanced Directives  Does Patient Have a Medical Advance Directive? No  Would patient like information on creating a medical advance directive? Yes (MAU/Ambulatory/Procedural Areas - Information given)   Information on Advanced Care Planning can be found at [place]  Secretary of [facility] Advance Health Care Directives Advance Health Care Directives ([email or web address]   Vision:  Annual vision screenings are recommended for early detection of glaucoma, cataracts, and diabetic retinopathy. These exams can also reveal signs of chronic conditions such as diabetes and high blood pressure.  Dental: Annual dental screenings help detect early signs of oral cancer, gum disease, and other conditions linked to overall health, including heart disease and diabetes.  Please see the attached documents for additional preventive care recommendations.

## 2024-09-21 NOTE — Progress Notes (Signed)
 "  Chief Complaint  Patient presents with   Medicare Wellness     Subjective:   [name] is a 73 y.o. male who presents for a [employer] Visit.  Visit info / Clinical Intake: Medicare Wellness Visit Type:: Subsequent Annual Wellness Visit Persons participating in visit and providing information:: patient Medicare Wellness Visit Mode:: Telephone If telephone:: video declined Since this visit was completed virtually, some vitals may be partially provided or unavailable. Missing vitals are due to the limitations of the virtual format.: Documented vitals are patient reported If Telephone or Video please confirm:: I connected with patient using audio/video enable telemedicine. I verified patient identity with two identifiers, discussed telehealth limitations, and patient agreed to proceed. Patient Location:: home Provider Location:: office Interpreter Needed?: No Pre-visit prep was completed: yes AWV questionnaire completed by patient prior to visit?: no Living arrangements:: (!) lives alone Patient's Overall Health Status Rating: good Typical amount of pain: some Does pain affect daily life?: no Are you currently prescribed opioids?: no  Dietary Habits and Nutritional Risks How many meals a day?: 3 Eats fruit and vegetables daily?: yes Most meals are obtained by: preparing own meals In the last 2 weeks, have you had any of the following?: none Diabetic:: (!) yes Any non-healing wounds?: no How often do you check your BS?: as needed Would you like to be referred to a Nutritionist or for Diabetic Management? : no  Functional Status Activities of Daily Living (to include ambulation/medication): Independent Ambulation: Independent Medication Administration: Independent Home Management (perform basic housework or laundry): Independent Manage your own finances?: yes Primary transportation is: driving Concerns about vision?: no *vision screening is required for  WTM* Concerns about hearing?: no  Fall Screening Falls in the past year?: 0 Number of falls in past year: 0 Was there an injury with Fall?: 0 Fall Risk Category Calculator: 0 Patient Fall Risk Level: Low Fall Risk  Fall Risk Patient at Risk for Falls Due to: Orthopedic patient Fall risk Follow up: Falls evaluation completed; Education provided; Falls prevention discussed  Home and Transportation Safety: All rugs have non-skid backing?: yes All stairs or steps have railings?: yes Grab bars in the bathtub or shower?: yes Have non-skid surface in bathtub or shower?: yes Good home lighting?: yes Regular seat belt use?: yes Hospital stays in the last year:: (!) yes How many hospital stays:: 1 Reason: knee surgery  Cognitive Assessment Difficulty concentrating, remembering, or making decisions? : no Will 6CIT or Mini Cog be Completed: no 6CIT or Mini Cog Declined: patient alert, oriented, able to answer questions appropriately and recall recent events  Advance Directives (For Healthcare) Does Patient Have a Medical Advance Directive?: No Would patient like information on creating a medical advance directive?: Yes (MAU/Ambulatory/Procedural Areas - Information given)  Reviewed/Updated  Reviewed/Updated: Reviewed All (Medical, Surgical, Family, Medications, Allergies, Care Teams, Patient Goals)    Allergies (verified) Patient has no known allergies.   Current Medications (verified) Outpatient Encounter Medications as of 09/21/2024  Medication Sig   allopurinol  (ZYLOPRIM ) 300 MG tablet TAKE 1 TABLET BY MOUTH EVERY DAY   amLODipine  (NORVASC ) 10 MG tablet Take 1 tablet (10 mg total) by mouth daily.   Bromfenac Sodium 0.09 % SOLN Place 1 drop into the left eye 2 (two) times daily.   Difluprednate 0.05 % EMUL Place 1 drop into the left eye 2 (two) times daily.   dorzolamide -timolol  (COSOPT ) 2-0.5 % ophthalmic solution Place 1 drop into the left eye 2 (two) times daily.  fenofibrate  160 MG tablet TAKE 1 TABLET BY MOUTH EVERY DAY   latanoprost  (XALATAN ) 0.005 % ophthalmic solution Place 1 drop into the left eye at bedtime.    lisinopril -hydrochlorothiazide  (ZESTORETIC ) 20-25 MG tablet Take 1 tablet by mouth daily.   metFORMIN  (GLUCOPHAGE ) 500 MG tablet Take 1 tablet (500 mg total) by mouth 2 (two) times daily with a meal.   mycophenolate (CELLCEPT) 500 MG tablet Take 2 tablets by mouth 2 (two) times daily.   omeprazole (PRILOSEC) 20 MG capsule Take 20 mg by mouth every morning.    pravastatin  (PRAVACHOL ) 40 MG tablet TAKE 1 TABLET BY MOUTH EVERY DAY   prednisoLONE  acetate (PRED FORTE ) 1 % ophthalmic suspension Place 1 drop into the left eye 4 (four) times daily.   acetaminophen  (TYLENOL ) 500 MG tablet Take 2 tablets (1,000 mg total) by mouth every 8 (eight) hours. (Patient not taking: Reported on 09/21/2024)   chlorhexidine  (HIBICLENS ) 4 % external liquid Apply 15 mLs (1 Application total) topically as directed for 30 doses. Use as directed daily for 5 days every other week for 6 weeks. (Patient not taking: Reported on 09/21/2024)   docusate sodium  (COLACE) 100 MG capsule Take 1 capsule (100 mg total) by mouth 2 (two) times daily. (Patient not taking: Reported on 09/21/2024)   ibuprofen  (ADVIL ) 600 MG tablet Take 1 tablet (600 mg total) by mouth every 6 (six) hours as needed. (Patient not taking: Reported on 09/21/2024)   Facility-Administered Encounter Medications as of 09/21/2024  Medication   vancomycin  (VANCOCIN ) 1,000 mg in sodium chloride  0.9 % 500 mL IVPB    History: Past Medical History:  Diagnosis Date   Arthritis    Barrett esophagus    Bladder neck obstruction    Cataract    removed in left eye   Colon polyp    Diabetes mellitus without complication (HCC)    Esophageal stricture    GERD (gastroesophageal reflux disease)    Glaucoma    History of hiatal hernia    Hyperlipidemia    Hypertension    Peyronie disease    Prosthetic eye globe     Right eye   Sleep apnea    does not use machine per pt.   Smoker    Wears glasses    Past Surgical History:  Procedure Laterality Date   BACK SURGERY     lumbar decompression and fusion (Dr. Louis) 2019   COLONOSCOPY     DP   ELBOW SURGERY Right    EYE SURGERY     fractured radius surgery     JOINT REPLACEMENT     KNEE ARTHROPLASTY Left 11/25/2023   Procedure: ARTHROPLASTY, KNEE, TOTAL, USING IMAGELESS COMPUTER-ASSISTED NAVIGATION;  Surgeon: Fidel Rogue, MD;  Location: [facility];  Service: Orthopedics;  Laterality: Left;  150 min   KNEE ARTHROSCOPY N/A    NASAL SINUS SURGERY     after injury has a metal plate   NESBIT PROCEDURE N/A 12/21/2019   Procedure: NESBIT PROCEDURE PENILE PLICATION AND PENILE BLOCK;  Surgeon: Alvaro Hummer, MD;  Location: [facility];  Service: Urology;  Laterality: N/A;  1 HR   POLYPECTOMY     TONSILLECTOMY     removed as child unsure of age   TOTAL KNEE ARTHROPLASTY Right    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 2021  Vaping Use   Vaping status: Never Used  Substance and Sexual Activity   Alcohol  use: Yes    Alcohol /week: 10.0 standard drinks of alcohol     Types: 10 Standard drinks or equivalent per week   Drug use: No   Sexual activity: Yes   Tobacco Counseling Ready to quit: Not Answered Counseling given: Not Answered Tobacco comments: quit Jan.  2021  SDOH Screenings   Food Insecurity: No Food Insecurity (09/21/2024)  Housing: Low Risk (09/21/2024)  Transportation Needs: No Transportation Needs (09/21/2024)  Utilities: Not At Risk (09/21/2024)  Alcohol  Screen: Low Risk (08/01/2024)  Depression (PHQ2-9): Low Risk (09/21/2024)  Financial Resource Strain: Low Risk (08/01/2024)  Physical Activity: Insufficiently Active (09/21/2024)  Social Connections: Moderately Integrated (09/21/2024)  Stress: No Stress Concern Present (09/21/2024)  Tobacco Use: High Risk (09/21/2024)  Health Literacy: Adequate Health Literacy (09/21/2024)   See flowsheets for full screening details  Depression Screen PHQ 2 & 9 Depression Scale- Over the past 2 weeks, how often have you been bothered by any of the following problems? Little interest or pleasure in doing things: 0 Feeling down, depressed, or hopeless (PHQ Adolescent also includes...irritable): 0 PHQ-2 Total Score: 0     Goals Addressed             This Visit's Progress    Maintain health and independence   On track            Objective:    Today's Vitals   09/21/24 1046  Weight: 266 lb (120.7 kg)  Height: 5' 11 (1.803 m)   Body mass index is 37.1 kg/m.  Hearing/Vision screen Hearing Screening - Comments:: Patient is able to hear conversational tones without difficulty. No issues reported.   Vision Screening - Comments:: Wears rx glasses - up to date with routine eye exams with Dr. Jarold  Immunizations and Health Maintenance Health Maintenance  Topic Date Due   COVID-19 Vaccine (4 - 2025-26 season) 05/23/2024   FOOT EXAM  12/31/2024 (Originally 05/25/2024)   DTaP/Tdap/Td (2 - Tdap) 08/02/2025 (Originally 09/22/2017)   Zoster Vaccines- Shingrix  (2 of 2) 09/27/2024   OPHTHALMOLOGY EXAM  01/04/2025   HEMOGLOBIN A1C  01/30/2025   Colonoscopy  02/08/2025   Diabetic kidney evaluation - eGFR measurement  08/02/2025   Diabetic kidney evaluation - Urine ACR  08/02/2025    Medicare Annual Wellness (AWV)  09/21/2025   Pneumococcal Vaccine: 50+ Years  Completed   Influenza Vaccine  Completed   Hepatitis C Screening  Completed   Meningococcal B Vaccine  Aged Out        Assessment/Plan:  This is a routine wellness examination for Edmonson.  Patient Care Team: Duanne Butler DASEN, MD as PCP - General (Family Medicine) Patrcia Sharper, MD as Consulting Physician (Ophthalmology) Jarold Mayo, MD as Consulting Physician (Ophthalmology)  I have personally reviewed  and noted the following in the patients chart:   Medical and social history Use of alcohol , tobacco or illicit drugs  Current medications and supplements including opioid prescriptions. Functional ability and status Nutritional status Physical activity Advanced directives List of other physicians Hospitalizations, surgeries, and ER visits in previous 12 months Vitals Screenings to include cognitive, depression, and falls Referrals and appointments  No orders of the defined types were placed in this encounter.  In addition, I have reviewed and discussed with patient certain preventive protocols, quality metrics, and best practice recommendations. A written personalized care plan for preventive services as well as general preventive health recommendations were provided to patient.   Cesareo, Vickrey, LPN   [id number]   Return in 1 year (on 09/21/2025).  After Visit Summary: (MyChart) Due to this being a telephonic visit, the after visit summary with patients personalized plan was offered to patient via MyChart   Nurse Notes: HM Addressed: Records requested for last diabetic eye exam   "

## 2024-09-21 NOTE — Telephone Encounter (Signed)
 Sent in medication

## 2024-09-25 ENCOUNTER — Encounter: Payer: Self-pay | Admitting: Family Medicine

## 2024-09-26 ENCOUNTER — Other Ambulatory Visit: Payer: Self-pay

## 2024-09-26 DIAGNOSIS — E669 Obesity, unspecified: Secondary | ICD-10-CM

## 2024-09-26 DIAGNOSIS — E1169 Type 2 diabetes mellitus with other specified complication: Secondary | ICD-10-CM

## 2024-09-26 DIAGNOSIS — G4733 Obstructive sleep apnea (adult) (pediatric): Secondary | ICD-10-CM

## 2024-09-26 DIAGNOSIS — E118 Type 2 diabetes mellitus with unspecified complications: Secondary | ICD-10-CM

## 2024-09-26 MED ORDER — TIRZEPATIDE 2.5 MG/0.5ML ~~LOC~~ SOAJ: 2.5000 mg | SUBCUTANEOUS | 0 refills | Status: AC

## 2024-09-27 ENCOUNTER — Other Ambulatory Visit (HOSPITAL_COMMUNITY): Payer: Self-pay

## 2024-10-03 ENCOUNTER — Encounter: Payer: Self-pay | Admitting: Urology

## 2024-10-03 ENCOUNTER — Ambulatory Visit: Admitting: Urology

## 2024-10-03 VITALS — BP 124/77 | HR 80

## 2024-10-03 DIAGNOSIS — R972 Elevated prostate specific antigen [PSA]: Secondary | ICD-10-CM

## 2024-10-03 LAB — MICROSCOPIC EXAMINATION
Bacteria, UA: NONE SEEN
RBC, Urine: NONE SEEN /HPF (ref 0–2)
WBC, UA: NONE SEEN /HPF (ref 0–5)

## 2024-10-03 LAB — URINALYSIS, ROUTINE W REFLEX MICROSCOPIC
Bilirubin, UA: NEGATIVE
Glucose, UA: NEGATIVE
Ketones, UA: NEGATIVE
Leukocytes,UA: NEGATIVE
Nitrite, UA: NEGATIVE
RBC, UA: NEGATIVE
Specific Gravity, UA: 1.02 (ref 1.005–1.030)
Urobilinogen, Ur: 0.2 mg/dL (ref 0.2–1.0)
pH, UA: 6 (ref 5.0–7.5)

## 2024-10-03 NOTE — Progress Notes (Signed)
 "  10/03/2024 9:45 AM   Beryl GORMAN Browner [birth year] [id number]  Referring provider: Duanne Butler DASEN, MD [address] [place],  [place] [address]  Elevated PSA   HPI: Mr Placeres is a 74yo here for evaluation of elevated PSA. PSA was 1.93 three years ago. 3.07 last year and 4.2 this year. IPSS 6 QOL 0 on no BPh therapy. He has nocturia 4x but he drinks beer and water prior to going to bed. No family history of prostate cancer. No dysuria or hematuria.    PMH: Past Medical History:  Diagnosis Date   Arthritis    Barrett esophagus    Bladder neck obstruction    Cataract    removed in left eye   Colon polyp    Diabetes mellitus without complication (HCC)    Esophageal stricture    GERD (gastroesophageal reflux disease)    Glaucoma    History of hiatal hernia    Hyperlipidemia    Hypertension    Peyronie disease    Prosthetic eye globe    Right eye   Sleep apnea    does not use machine per pt.   Smoker    Wears glasses     Surgical History: Past Surgical History:  Procedure Laterality Date   BACK SURGERY     lumbar decompression and fusion (Dr. Louis) 2019   COLONOSCOPY     DP   ELBOW SURGERY Right    EYE SURGERY     fractured radius surgery     JOINT REPLACEMENT     KNEE ARTHROPLASTY Left 11/25/2023   Procedure: ARTHROPLASTY, KNEE, TOTAL, USING IMAGELESS COMPUTER-ASSISTED NAVIGATION;  Surgeon: Fidel Rogue, MD;  Location: [facility];  Service: Orthopedics;  Laterality: Left;  150 min   KNEE ARTHROSCOPY N/A    NASAL SINUS SURGERY     after injury has a metal plate   NESBIT PROCEDURE N/A 12/21/2019   Procedure: NESBIT PROCEDURE PENILE PLICATION AND PENILE BLOCK;  Surgeon: Alvaro Hummer, MD;  Location: [facility];  Service: Urology;  Laterality: N/A;  1 HR   POLYPECTOMY     TONSILLECTOMY     removed as child unsure of age   TOTAL KNEE ARTHROPLASTY Right    traumatic eye enucleation  2011   right artifical eye    Home Medications:   Allergies as of 10/03/2024   No Known Allergies      Medication List        Accurate as of October 03, 2024  9:45 AM. If you have any questions, ask your nurse or doctor.          acetaminophen  500 MG tablet Commonly known as: TYLENOL  Take 2 tablets (1,000 mg total) by mouth every 8 (eight) hours.   allopurinol  300 MG tablet Commonly known as: ZYLOPRIM  TAKE 1 TABLET BY MOUTH EVERY DAY   amLODipine  10 MG tablet Commonly known as: NORVASC  Take 1 tablet (10 mg total) by mouth daily.   Bromfenac Sodium 0.09 % Soln Place 1 drop into the left eye 2 (two) times daily.   chlorhexidine  4 % external liquid Commonly known as: HIBICLENS  Apply 15 mLs (1 Application total) topically as directed for 30 doses. Use as directed daily for 5 days every other week for 6 weeks.   Difluprednate 0.05 % Emul Place 1 drop into the left eye 2 (two) times daily.   docusate sodium  100 MG capsule Commonly known as: COLACE Take 1 capsule (100 mg total) by mouth  2 (two) times daily.   dorzolamide -timolol  2-0.5 % ophthalmic solution Commonly known as: COSOPT  Place 1 drop into the left eye 2 (two) times daily.   fenofibrate  160 MG tablet TAKE 1 TABLET BY MOUTH EVERY DAY   ibuprofen  600 MG tablet Commonly known as: ADVIL  Take 1 tablet (600 mg total) by mouth every 6 (six) hours as needed.   latanoprost  0.005 % ophthalmic solution Commonly known as: XALATAN  Place 1 drop into the left eye at bedtime.   lisinopril -hydrochlorothiazide  20-25 MG tablet Commonly known as: ZESTORETIC  Take 1 tablet by mouth daily.   metFORMIN  500 MG tablet Commonly known as: GLUCOPHAGE  Take 1 tablet (500 mg total) by mouth 2 (two) times daily with a meal.   mycophenolate 500 MG tablet Commonly known as: CELLCEPT Take 2 tablets by mouth 2 (two) times daily.   omeprazole 20 MG capsule Commonly known as: PRILOSEC Take 20 mg by mouth every morning.   pravastatin  40 MG tablet Commonly known as:  PRAVACHOL  TAKE 1 TABLET BY MOUTH EVERY DAY   prednisoLONE  acetate 1 % ophthalmic suspension Commonly known as: PRED FORTE  Place 1 drop into the left eye 4 (four) times daily.   tirzepatide  2.5 MG/0.5ML Pen Commonly known as: MOUNJARO  Inject 2.5 mg into the skin once a week.        Allergies: Allergies[1]  Family History: Family History  Problem Relation Age of Onset   Cancer Father        lung (smoker)   Colon cancer Brother 35   Colon polyps Neg Hx    Esophageal cancer Neg Hx    Rectal cancer Neg Hx    Stomach cancer Neg Hx    Pancreatic cancer Neg Hx    Liver cancer Neg Hx    Prostate cancer Neg Hx    Crohn's disease Neg Hx    Ulcerative colitis Neg Hx     Social History:  reports that he has been smoking cigarettes. He has never used smokeless tobacco. He reports current alcohol  use of about 10.0 standard drinks of alcohol  per week. He reports that he does not use drugs.  ROS: All other review of systems were reviewed and are negative except what is noted above in HPI  Physical Exam: BP 124/77   Pulse 80   Constitutional:  Alert and oriented, No acute distress. HEENT: [place] AT, moist mucus membranes.  Trachea midline, no masses. Cardiovascular: No clubbing, cyanosis, or edema. Respiratory: Normal respiratory effort, no increased work of breathing. GI: Abdomen is soft, nontender, nondistended, no abdominal masses GU: No CVA tenderness.  Lymph: No cervical or inguinal lymphadenopathy. Skin: No rashes, bruises or suspicious lesions. Neurologic: Grossly intact, no focal deficits, moving all 4 extremities. Psychiatric: Normal mood and affect.  Laboratory Data: Lab Results  Component Value Date   WBC 6.0 08/02/2024   HGB 13.7 08/02/2024   HCT 40.4 08/02/2024   MCV 91.0 08/02/2024   PLT 240 08/02/2024    Lab Results  Component Value Date   CREATININE 0.84 08/02/2024    Lab Results  Component Value Date   PSA 4.25 (H) 08/02/2024   PSA 3.07 05/26/2023    PSA 1.94 05/17/2021    No results found for: TESTOSTERONE  Lab Results  Component Value Date   HGBA1C 6.0 (H) 08/02/2024    Urinalysis    Component Value Date/Time   COLORURINE YELLOW 04/11/2008 0800   APPEARANCEUR CLEAR 04/11/2008 0800   LABSPEC 1.019 04/11/2008 0800   PHURINE 6.0 04/11/2008 0800  GLUCOSEU NEGATIVE 04/11/2008 0800   HGBUR NEGATIVE 04/11/2008 0800   BILIRUBINUR NEGATIVE 04/11/2008 0800   KETONESUR NEGATIVE 04/11/2008 0800   PROTEINUR NEGATIVE 04/11/2008 0800   UROBILINOGEN 0.2 04/11/2008 0800   NITRITE NEGATIVE 04/11/2008 0800   LEUKOCYTESUR  04/11/2008 0800    NEGATIVE MICROSCOPIC NOT DONE ON URINES WITH NEGATIVE PROTEIN, BLOOD, LEUKOCYTES, NITRITE, OR GLUCOSE <1000 mg/dL.    No results found for: LABMICR, WBCUA, RBCUA, LABEPIT, MUCUS, BACTERIA  Pertinent Imaging:  No results found for this or any previous visit.  No results found for this or any previous visit.  No results found for this or any previous visit.  No results found for this or any previous visit.  No results found for this or any previous visit.  No results found for this or any previous visit.  No results found for this or any previous visit.  No results found for this or any previous visit.   Assessment & Plan:    1. Elevated PSA (Primary) IsoPSA, will call with results. If elevated we will proceed with MRi and then possible prostate biopsy. If normal I will see him back in 6 months with a PSA - Urinalysis, Routine w reflex microscopic   No follow-ups on file.  Belvie Clara, MD  [facility] [place]       [1] No Known Allergies  "

## 2024-10-03 NOTE — Patient Instructions (Signed)

## 2024-10-10 DIAGNOSIS — R972 Elevated prostate specific antigen [PSA]: Secondary | ICD-10-CM

## 2024-10-19 ENCOUNTER — Telehealth: Payer: Self-pay

## 2024-10-19 DIAGNOSIS — R972 Elevated prostate specific antigen [PSA]: Secondary | ICD-10-CM

## 2024-10-19 NOTE — Telephone Encounter (Signed)
-----   Message from Belvie Clara, MD sent at 10/18/2024 10:00 AM EST ----- MRI ----- Message ----- From: Gretta Carlos SAUNDERS, CMA Sent: 10/12/2024   1:31 PM EST To: Belvie LITTIE Clara, MD  Please review

## 2024-10-21 NOTE — Telephone Encounter (Signed)
 Tried calling pt with no answer. LVM

## 2024-10-26 ENCOUNTER — Other Ambulatory Visit: Payer: Self-pay | Admitting: Family Medicine

## 2024-10-26 DIAGNOSIS — I10 Essential (primary) hypertension: Secondary | ICD-10-CM

## 2024-11-03 ENCOUNTER — Ambulatory Visit (HOSPITAL_COMMUNITY)

## 2025-04-06 ENCOUNTER — Other Ambulatory Visit

## 2025-04-12 ENCOUNTER — Ambulatory Visit: Admitting: Urology
